# Patient Record
Sex: Male | Born: 1950 | Race: Black or African American | Hispanic: No | Marital: Single | Smoking: Never smoker
Health system: Southern US, Community
[De-identification: ages and names within clinical notes are randomized; demographics above are authoritative.]

## PROBLEM LIST (undated history)

## (undated) DIAGNOSIS — M109 Gout, unspecified: Secondary | ICD-10-CM

## (undated) DIAGNOSIS — Z8639 Personal history of other endocrine, nutritional and metabolic disease: Secondary | ICD-10-CM

## (undated) DIAGNOSIS — J386 Stenosis of larynx: Secondary | ICD-10-CM

## (undated) DIAGNOSIS — R112 Nausea with vomiting, unspecified: Secondary | ICD-10-CM

## (undated) DIAGNOSIS — R066 Hiccough: Secondary | ICD-10-CM

## (undated) DIAGNOSIS — I10 Essential (primary) hypertension: Secondary | ICD-10-CM

## (undated) DIAGNOSIS — R569 Unspecified convulsions: Secondary | ICD-10-CM

## (undated) DIAGNOSIS — R339 Retention of urine, unspecified: Secondary | ICD-10-CM

## (undated) DIAGNOSIS — F1010 Alcohol abuse, uncomplicated: Secondary | ICD-10-CM

## (undated) DIAGNOSIS — K219 Gastro-esophageal reflux disease without esophagitis: Secondary | ICD-10-CM

## (undated) HISTORY — PX: ESOPHAGOGASTRODUODENOSCOPY ENDOSCOPY: SHX5814

## (undated) HISTORY — PX: OTHER SURGICAL HISTORY: SHX169

## (undated) NOTE — *Deleted (*Deleted)
Neurology Progress Note  Patient ID: Evan Jacobs is a 7 y.o. with PMHx of  has a past medical history of Acid reflux, Alcohol abuse, Gout, Hypertension, and Seizures (HCC). Found to have severe hyponatremia to 113 and hypokalemia (2.4), magnesium 1.2  Initially consulted for: Altered mental status  Major interval events:  - Intubated for failure to protect airway / emesis ***  Subjective: ***  Exam: Vitals:   06/30/20 2000 06/30/20 2100  BP: 135/88 (!) 141/71  Pulse: 91 92  Resp: 20 18  Temp: 98.5 F (36.9 C)   SpO2: 100% 100%   Gen: In bed, comfortable  Resp: non-labored breathing, no grossly audible wheezing Cardiac: Perfusing extremities well  Abd: soft, nt  Neuro: MS: *** CN:*** Motor: *** Sensory:*** DTR:***  Dr. Amada Jupiter exam 10/31 for reference:  Neuro: Mental Status: Patient is awake, alert, when I call his name, he looks towards me and says "yes" but does not follow commands or tell me his name.  Cranial Nerves: II: He blinks to threat bilaterally pupils are equal, round, and reactive to light.   III,IV, VI: EOMI without ptosis or diploplia.  V: Facial sensation is symmetric to temperature VII: Facial movement with left facial weakness Motor: He does not cooperate with formal testing, but appears to move the right side slightly more than the left.  He does not keep either leg off the bed, but does counter gravity with both the left and right arm times Sensory: He response to noxious stimulation in all four extremities Cerebellar: Does not perform   Pertinent Labs: ***  MRI brain pending Routine EEG: EEG Abnormalities: 1) Generalized periodic discharges with triphasic morphology 2) Generalized high voltage slow activity.  Long-term EEG: *** No definite response to Ativan challenge  Impression: ***  Recommendations: - ***  Brooke Dare MD-PhD [facility] [phone]

---

## 2017-10-31 ENCOUNTER — Emergency Department (HOSPITAL_BASED_OUTPATIENT_CLINIC_OR_DEPARTMENT_OTHER): Payer: Medicare Other

## 2017-10-31 ENCOUNTER — Encounter (HOSPITAL_BASED_OUTPATIENT_CLINIC_OR_DEPARTMENT_OTHER): Payer: Self-pay | Admitting: Emergency Medicine

## 2017-10-31 ENCOUNTER — Other Ambulatory Visit: Payer: Self-pay

## 2017-10-31 ENCOUNTER — Emergency Department (HOSPITAL_BASED_OUTPATIENT_CLINIC_OR_DEPARTMENT_OTHER)
Admission: EM | Admit: 2017-10-31 | Discharge: 2017-11-01 | Disposition: A | Discharge status: Home / Self Care | Payer: Medicare Other | Attending: Emergency Medicine | Admitting: Emergency Medicine

## 2017-10-31 DIAGNOSIS — R112 Nausea with vomiting, unspecified: Secondary | ICD-10-CM

## 2017-10-31 DIAGNOSIS — R066 Hiccough: Secondary | ICD-10-CM | POA: Diagnosis not present

## 2017-10-31 DIAGNOSIS — I10 Essential (primary) hypertension: Secondary | ICD-10-CM | POA: Diagnosis not present

## 2017-10-31 DIAGNOSIS — E876 Hypokalemia: Secondary | ICD-10-CM

## 2017-10-31 HISTORY — DX: Essential (primary) hypertension: I10

## 2017-10-31 HISTORY — DX: Gastro-esophageal reflux disease without esophagitis: K21.9

## 2017-10-31 HISTORY — DX: Gout, unspecified: M10.9

## 2017-10-31 LAB — CBC WITH DIFFERENTIAL/PLATELET
Basophils Absolute: 0 10*3/uL (ref 0.0–0.1)
Basophils Relative: 0 %
Eosinophils Absolute: 0 10*3/uL (ref 0.0–0.7)
Eosinophils Relative: 0 %
HCT: 34.7 % — ABNORMAL LOW (ref 39.0–52.0)
Hemoglobin: 12 g/dL — ABNORMAL LOW (ref 13.0–17.0)
Lymphocytes Relative: 12 %
Lymphs Abs: 1.1 10*3/uL (ref 0.7–4.0)
MCH: 27.8 pg (ref 26.0–34.0)
MCHC: 34.6 g/dL (ref 30.0–36.0)
MCV: 80.3 fL (ref 78.0–100.0)
Monocytes Absolute: 1.1 10*3/uL — ABNORMAL HIGH (ref 0.1–1.0)
Monocytes Relative: 13 %
Neutro Abs: 6.5 10*3/uL (ref 1.7–7.7)
Neutrophils Relative %: 75 %
Platelets: 213 10*3/uL (ref 150–400)
RBC: 4.32 MIL/uL (ref 4.22–5.81)
RDW: 16.4 % — ABNORMAL HIGH (ref 11.5–15.5)
WBC: 8.7 10*3/uL (ref 4.0–10.5)

## 2017-10-31 LAB — COMPREHENSIVE METABOLIC PANEL
ALT: 18 U/L (ref 17–63)
AST: 31 U/L (ref 15–41)
Albumin: 4.5 g/dL (ref 3.5–5.0)
Alkaline Phosphatase: 66 U/L (ref 38–126)
Anion gap: 12 (ref 5–15)
BUN: 16 mg/dL (ref 6–20)
CO2: 28 mmol/L (ref 22–32)
Calcium: 8.9 mg/dL (ref 8.9–10.3)
Chloride: 93 mmol/L — ABNORMAL LOW (ref 101–111)
Creatinine, Ser: 1.01 mg/dL (ref 0.61–1.24)
GFR calc Af Amer: >60 mL/min (ref >60)
GFR calc non Af Amer: >60 mL/min (ref >60)
Glucose, Bld: 81 mg/dL (ref 65–99)
Potassium: 3.1 mmol/L — ABNORMAL LOW (ref 3.5–5.1)
Sodium: 133 mmol/L — ABNORMAL LOW (ref 135–145)
Total Bilirubin: 1 mg/dL (ref 0.3–1.2)
Total Protein: 7.9 g/dL (ref 6.5–8.1)

## 2017-10-31 LAB — LIPASE, BLOOD: Lipase: 21 U/L (ref 11–51)

## 2017-10-31 LAB — MAGNESIUM: Magnesium: 1.7 mg/dL (ref 1.7–2.4)

## 2017-10-31 LAB — TROPONIN I: Troponin I: 0.03 ng/mL (ref <0.03)

## 2017-10-31 MED ORDER — PANTOPRAZOLE SODIUM 40 MG IV SOLR
40.0000 mg | Freq: Once | INTRAVENOUS | Status: AC
  Administered 2017-10-31: 40 mg via INTRAVENOUS
  Filled 2017-10-31: qty 40

## 2017-10-31 MED ORDER — SODIUM CHLORIDE 0.9 % IV BOLUS (SEPSIS)
500.0000 mL | Freq: Once | INTRAVENOUS | Status: AC
  Administered 2017-10-31: 500 mL via INTRAVENOUS

## 2017-10-31 MED ORDER — POTASSIUM CHLORIDE CRYS ER 20 MEQ PO TBCR
40.0000 meq | EXTENDED_RELEASE_TABLET | Freq: Once | ORAL | Status: AC
  Administered 2017-10-31: 40 meq via ORAL
  Filled 2017-10-31: qty 2

## 2017-10-31 MED ORDER — IOPAMIDOL (ISOVUE-300) INJECTION 61%
100.0000 mL | Freq: Once | INTRAVENOUS | Status: AC | PRN
  Administered 2017-10-31: 100 mL via INTRAVENOUS

## 2017-10-31 MED ORDER — ONDANSETRON HCL 4 MG/2ML IJ SOLN
4.0000 mg | Freq: Once | INTRAMUSCULAR | Status: AC
  Administered 2017-10-31: 4 mg via INTRAVENOUS
  Filled 2017-10-31: qty 2

## 2017-10-31 NOTE — ED Notes (Addendum)
Alert, NAD, calm, interactive, resps e/u, speaking in clear complete sentences, no dyspnea noted, skin W&D, VSS, c/o hiccups, productive cough (brown), vomiting (x4), also some discomfort with cough and hiccups, also sore throat, (denies: pain, sob, fever, nausea, diarrhea, bleeding, dizziness or visual changes).

## 2017-10-31 NOTE — ED Triage Notes (Signed)
Patient states that he has had the hiccups x 2 days - Patient states that he is having N/V

## 2017-10-31 NOTE — ED Notes (Signed)
Dr Madilyn Hookees aware that pt has an elevated troponin level of 0.03.

## 2017-10-31 NOTE — ED Notes (Signed)
No changes. Alert, NAD, calm, interactive, resps e/u, speaking in clear complete sentences, no dyspnea noted, skin W&D, VSS, states, "feel better", (denies: pain, sob, nausea, dizziness or visual changes). [facility]

## 2017-11-01 DIAGNOSIS — R066 Hiccough: Secondary | ICD-10-CM | POA: Diagnosis not present

## 2017-11-01 LAB — TROPONIN I: Troponin I: 0.03 ng/mL (ref <0.03)

## 2017-11-01 MED ORDER — ONDANSETRON HCL 4 MG/2ML IJ SOLN
INTRAMUSCULAR | Status: AC
  Filled 2017-11-01: qty 2

## 2017-11-01 MED ORDER — ONDANSETRON HCL 4 MG/2ML IJ SOLN
4.0000 mg | Freq: Once | INTRAMUSCULAR | Status: AC
  Administered 2017-11-01: 4 mg via INTRAVENOUS

## 2017-11-01 MED ORDER — PROMETHAZINE HCL 25 MG PO TABS: 25.0000 mg | ORAL_TABLET | Freq: Four times a day (QID) | ORAL | 0 refills | Status: DC | PRN

## 2017-11-01 MED ORDER — OMEPRAZOLE 20 MG PO CPDR: 20.0000 mg | DELAYED_RELEASE_CAPSULE | Freq: Every day | ORAL | 0 refills | Status: DC

## 2017-11-01 NOTE — ED Provider Notes (Signed)
[facility] EMERGENCY DEPARTMENT Provider Note   CSN: [id number] Arrival date & time: 10/31/17  1805     History   Chief Complaint Chief Complaint  Patient presents with  . Hiccups    HPI Evan Jacobs is a 67 y.o. male.  The history is provided by the patient. No language interpreter was used.    Evan Jacobs is a 67 y.o. male who presents to the Emergency Department complaining of hiccups.  He has a history of recurrent hiccups.  2 days ago he drank beer and developed persistent hiccups with associated emesis, numerous episodes.  He denies any chest pain, abdominal pain.  Last bowel movement was yesterday and it was normal.  No hematochezia or melena.  He drinks occasionally but describes himself as not a heavy drinker.  Symptoms are moderate and constant in nature.  Past Medical History:  Diagnosis Date  . Acid reflux   . Gout   . Hypertension     There are no active problems to display for this patient.   History reviewed. No pertinent surgical history.     Home Medications    Prior to Admission medications   Medication Sig Start Date End Date Taking? Authorizing Provider  omeprazole (PRILOSEC) 20 MG capsule Take 1 capsule (20 mg total) by mouth daily. 11/01/17   Tilden Fossa, MD  promethazine (PHENERGAN) 25 MG tablet Take 1 tablet (25 mg total) by mouth every 6 (six) hours as needed for nausea or vomiting. 11/01/17   Tilden Fossa, MD    Family History History reviewed. No pertinent family history.  Social History Social History   Tobacco Use  . Smoking status: Never Smoker  . Smokeless tobacco: Never Used  Substance Use Topics  . Alcohol use: Yes    Comment: occ  . Drug use: No     Allergies   Patient has no known allergies.   Review of Systems Review of Systems  All other systems reviewed and are negative.    Physical Exam Updated Vital Signs BP (!) 162/78   Pulse 99   Temp 99.6 F (37.6 C) (Oral)   Resp (!) 24   Ht 5'  6" (1.676 m)   Wt 68 kg (150 lb)   SpO2 98%   BMI 24.21 kg/m   Physical Exam  Constitutional: He is oriented to person, place, and time. He appears well-developed and well-nourished.  HENT:  Head: Normocephalic and atraumatic.  Cardiovascular: Normal rate and regular rhythm.  Murmur heard. Pulmonary/Chest: Effort normal and breath sounds normal. No respiratory distress.  Abdominal: Soft. There is no tenderness. There is no rebound and no guarding.  Musculoskeletal: He exhibits no edema or tenderness.  Neurological: He is alert and oriented to person, place, and time.  Skin: Skin is warm and dry.  Psychiatric: He has a normal mood and affect. His behavior is normal.  Nursing note and vitals reviewed.    ED Treatments / Results  Labs (all labs ordered are listed, but only abnormal results are displayed) Labs Reviewed  COMPREHENSIVE METABOLIC PANEL - Abnormal; Notable for the following components:      Result Value   Sodium 133 (*)    Potassium 3.1 (*)    Chloride 93 (*)    All other components within normal limits  CBC WITH DIFFERENTIAL/PLATELET - Abnormal; Notable for the following components:   Hemoglobin 12.0 (*)    HCT 34.7 (*)    RDW 16.4 (*)    Monocytes Absolute 1.1 (*)  All other components within normal limits  TROPONIN I - Abnormal; Notable for the following components:   Troponin I 0.03 (*)    All other components within normal limits  TROPONIN I - Abnormal; Notable for the following components:   Troponin I 0.03 (*)    All other components within normal limits  LIPASE, BLOOD  MAGNESIUM    EKG  EKG Interpretation  Date/Time:  Sunday October 31 2017 21:48:58 EST Ventricular Rate:  100 PR Interval:    QRS Duration: 82 QT Interval:  335 QTC Calculation: 432 R Axis:   63 Text Interpretation:  Sinus tachycardia Left ventricular hypertrophy Anterior infarct, old Abnormal T, consider ischemia, lateral leads Confirmed by Tilden Fossaees, Phi Avans [phone]([id number]) on 10/31/2017  9:52:36 PM       Radiology Ct Abdomen Pelvis W Contrast  Result Date: 10/31/2017 CLINICAL DATA:  Nausea and vomiting EXAM: CT ABDOMEN AND PELVIS WITH CONTRAST TECHNIQUE: Multidetector CT imaging of the abdomen and pelvis was performed using the standard protocol following bolus administration of intravenous contrast. CONTRAST:  100mL ISOVUE-300 IOPAMIDOL (ISOVUE-300) INJECTION 61% COMPARISON:  05/27/2016 FINDINGS: Lower chest: Small hiatal hernia with thickening of the lower esophagus. Hepatobiliary: Normal hepatic contours and density. No visible biliary dilatation. Normal gallbladder. Pancreas: Normal parenchymal contours without ductal dilatation. No peripancreatic fluid collection. Spleen: Normal. Adrenals/Urinary Tract: --Adrenal glands: Normal. --Right kidney/ureter: No hydronephrosis, perinephric stranding or nephrolithiasis. No obstructing ureteral stones. --Left kidney/ureter: No hydronephrosis, perinephric stranding or nephrolithiasis. No obstructing ureteral stones. 14 mm interpolar renal cyst. --Urinary bladder: Normal appearance for the degree of distention. Stomach/Bowel: --Stomach/Duodenum: Small hiatal hernia with thickening of the lower esophagus. --Small bowel: No dilatation or inflammation. --Colon: No focal abnormality. --Appendix: Normal. Vascular/Lymphatic: Normal course and caliber of the major abdominal vessels. No abdominal or pelvic lymphadenopathy. Reproductive: Normal prostate and seminal vesicles. Musculoskeletal. No bony spinal canal stenosis or focal osseous abnormality. Other: None. IMPRESSION: 1. Unchanged small hiatal hernia with thickening of the distal esophagus, likely secondary to chronic gastroesophageal reflux. 2. No acute abnormality. Electronically Signed   By: Deatra RobinsonKevin  Herman M.D.   On: 10/31/2017 23:17    Procedures Procedures (including critical care time)  Medications Ordered in ED Medications  sodium chloride 0.9 % bolus 500 mL (0 mLs Intravenous Stopped  10/31/17 2249)  ondansetron (ZOFRAN) injection 4 mg (4 mg Intravenous Given 10/31/17 2206)  iopamidol (ISOVUE-300) 61 % injection 100 mL (100 mLs Intravenous Contrast Given 10/31/17 2235)  pantoprazole (PROTONIX) injection 40 mg (40 mg Intravenous Given 10/31/17 2338)  potassium chloride SA (K-DUR,KLOR-CON) CR tablet 40 mEq (40 mEq Oral Given 10/31/17 2357)  ondansetron (ZOFRAN) injection 4 mg (4 mg Intravenous Given 11/01/17 0028)     Initial Impression / Assessment and Plan / ED Course  I have reviewed the triage vital signs and the nursing notes.  Pertinent labs & imaging results that were available during my care of the patient were reviewed by me and considered in my medical decision making (see chart for details).     Patient here for evaluation of nausea, vomiting, hiccups.  He has no significant tenderness on examination.  EKG is abnormal with no priors available for comparison.  He has no reports of chest pain.  He does have a history of hypertension and EKG findings could be found with LVH.  Initial troponin is minimally elevated.  Discussed with on-call cardiologist, Dr. Mayford Knifeurner,  who reviewed patient's EKG-plan for repeat troponin and if stable discharge home with outpatient follow-up. In terms of vomiting and  hiccups, initial concern for possible obstruction.  CT abdomen without acute obstruction.  Patient is tolerating orals in the department without difficulty.  Plan to discharge home with antiemetic.  Discussed importance of PCP follow-up regarding EKG findings as well as his current symptoms.  Discussed close return precautions.  Final Clinical Impressions(s) / ED Diagnoses   Final diagnoses:  Non-intractable vomiting with nausea, unspecified vomiting type  Hiccups  Hypokalemia    ED Discharge Orders        Ordered    promethazine (PHENERGAN) 25 MG tablet  Every 6 hours PRN     11/01/17 0049    omeprazole (PRILOSEC) 20 MG capsule  Daily     11/01/17 0049       Tilden Fossa,  MD 11/01/17 [phone]

## 2017-11-01 NOTE — ED Notes (Signed)
Dr Madilyn Hookees aware of elevated troponin level of 0.03

## 2017-11-01 NOTE — ED Notes (Signed)
Returned from b/r. C/o nausea. Repetitive persistent swallowing/hiccup/belching/reguritation w/o return. No other changes. Alert, NAD, calm, interactive.

## 2017-11-01 NOTE — Discharge Instructions (Signed)
Get rechecked immediately if you develop chest pain or abdominal pain.   Please follow-up with your family doctor immediately for further testing.  Get rechecked immediately if you develop any chest pain.

## 2018-10-01 HISTORY — PX: COLONOSCOPY: SHX174

## 2019-08-29 ENCOUNTER — Inpatient Hospital Stay (HOSPITAL_COMMUNITY)
Admission: EM | Admit: 2019-08-29 | Discharge: 2019-08-31 | DRG: 101 | Disposition: A | Discharge status: Home Health | Payer: Medicare Other | Attending: Internal Medicine | Admitting: Internal Medicine

## 2019-08-29 ENCOUNTER — Inpatient Hospital Stay (HOSPITAL_COMMUNITY): Payer: Medicare Other

## 2019-08-29 ENCOUNTER — Encounter (HOSPITAL_COMMUNITY): Payer: Self-pay | Admitting: Internal Medicine

## 2019-08-29 ENCOUNTER — Inpatient Hospital Stay (HOSPITAL_COMMUNITY)
Admit: 2019-08-29 | Discharge: 2019-08-29 | Disposition: A | Discharge status: Home / Self Care | Payer: Medicare Other | Attending: Internal Medicine | Admitting: Internal Medicine

## 2019-08-29 ENCOUNTER — Emergency Department (HOSPITAL_COMMUNITY): Payer: Medicare Other

## 2019-08-29 ENCOUNTER — Other Ambulatory Visit: Payer: Self-pay

## 2019-08-29 DIAGNOSIS — G40909 Epilepsy, unspecified, not intractable, without status epilepticus: Secondary | ICD-10-CM | POA: Diagnosis present

## 2019-08-29 DIAGNOSIS — Z20828 Contact with and (suspected) exposure to other viral communicable diseases: Secondary | ICD-10-CM | POA: Diagnosis present

## 2019-08-29 DIAGNOSIS — R4182 Altered mental status, unspecified: Secondary | ICD-10-CM

## 2019-08-29 DIAGNOSIS — E876 Hypokalemia: Secondary | ICD-10-CM | POA: Diagnosis present

## 2019-08-29 DIAGNOSIS — I10 Essential (primary) hypertension: Secondary | ICD-10-CM | POA: Diagnosis present

## 2019-08-29 DIAGNOSIS — E861 Hypovolemia: Secondary | ICD-10-CM | POA: Diagnosis present

## 2019-08-29 DIAGNOSIS — F1010 Alcohol abuse, uncomplicated: Secondary | ICD-10-CM | POA: Diagnosis present

## 2019-08-29 DIAGNOSIS — M109 Gout, unspecified: Secondary | ICD-10-CM | POA: Diagnosis present

## 2019-08-29 DIAGNOSIS — E222 Syndrome of inappropriate secretion of antidiuretic hormone: Secondary | ICD-10-CM | POA: Diagnosis present

## 2019-08-29 DIAGNOSIS — E871 Hypo-osmolality and hyponatremia: Secondary | ICD-10-CM

## 2019-08-29 DIAGNOSIS — R569 Unspecified convulsions: Secondary | ICD-10-CM | POA: Diagnosis not present

## 2019-08-29 DIAGNOSIS — K219 Gastro-esophageal reflux disease without esophagitis: Secondary | ICD-10-CM | POA: Diagnosis present

## 2019-08-29 DIAGNOSIS — Z79899 Other long term (current) drug therapy: Secondary | ICD-10-CM | POA: Diagnosis not present

## 2019-08-29 DIAGNOSIS — F10231 Alcohol dependence with withdrawal delirium: Secondary | ICD-10-CM | POA: Diagnosis not present

## 2019-08-29 DIAGNOSIS — R9431 Abnormal electrocardiogram [ECG] [EKG]: Secondary | ICD-10-CM | POA: Diagnosis present

## 2019-08-29 DIAGNOSIS — E872 Acidosis, unspecified: Secondary | ICD-10-CM

## 2019-08-29 DIAGNOSIS — R066 Hiccough: Secondary | ICD-10-CM | POA: Diagnosis present

## 2019-08-29 HISTORY — DX: Unspecified convulsions: R56.9

## 2019-08-29 HISTORY — DX: Alcohol abuse, uncomplicated: F10.10

## 2019-08-29 LAB — BASIC METABOLIC PANEL
Anion gap: 14 (ref 5–15)
Anion gap: 8 (ref 5–15)
BUN: <5 mg/dL — ABNORMAL LOW (ref 8–23)
BUN: <5 mg/dL — ABNORMAL LOW (ref 8–23)
CO2: 25 mmol/L (ref 22–32)
CO2: 25 mmol/L (ref 22–32)
Calcium: 8.3 mg/dL — ABNORMAL LOW (ref 8.9–10.3)
Calcium: 8.3 mg/dL — ABNORMAL LOW (ref 8.9–10.3)
Chloride: 85 mmol/L — ABNORMAL LOW (ref 98–111)
Chloride: 98 mmol/L (ref 98–111)
Creatinine, Ser: 0.82 mg/dL (ref 0.61–1.24)
Creatinine, Ser: 0.9 mg/dL (ref 0.61–1.24)
GFR calc Af Amer: >60 mL/min (ref >60)
GFR calc Af Amer: >60 mL/min (ref >60)
GFR calc non Af Amer: >60 mL/min (ref >60)
GFR calc non Af Amer: >60 mL/min (ref >60)
Glucose, Bld: 100 mg/dL — ABNORMAL HIGH (ref 70–99)
Glucose, Bld: 96 mg/dL (ref 70–99)
Potassium: 2.8 mmol/L — ABNORMAL LOW (ref 3.5–5.1)
Potassium: 3.6 mmol/L (ref 3.5–5.1)
Sodium: 124 mmol/L — ABNORMAL LOW (ref 135–145)
Sodium: 131 mmol/L — ABNORMAL LOW (ref 135–145)

## 2019-08-29 LAB — COMPREHENSIVE METABOLIC PANEL
ALT: 15 U/L (ref 0–44)
ALT: 12 U/L (ref 0–44)
AST: 50 U/L — ABNORMAL HIGH (ref 15–41)
AST: 40 U/L (ref 15–41)
Albumin: 4.6 g/dL (ref 3.5–5.0)
Albumin: 4 g/dL (ref 3.5–5.0)
Alkaline Phosphatase: 45 U/L (ref 38–126)
Alkaline Phosphatase: 41 U/L (ref 38–126)
Anion gap: 14 (ref 5–15)
Anion gap: 9 (ref 5–15)
BUN: 5 mg/dL — ABNORMAL LOW (ref 8–23)
BUN: <5 mg/dL — ABNORMAL LOW (ref 8–23)
CO2: 26 mmol/L (ref 22–32)
CO2: 26 mmol/L (ref 22–32)
Calcium: 8.5 mg/dL — ABNORMAL LOW (ref 8.9–10.3)
Calcium: 8.1 mg/dL — ABNORMAL LOW (ref 8.9–10.3)
Chloride: 84 mmol/L — ABNORMAL LOW (ref 98–111)
Chloride: 92 mmol/L — ABNORMAL LOW (ref 98–111)
Creatinine, Ser: 0.85 mg/dL (ref 0.61–1.24)
Creatinine, Ser: 0.75 mg/dL (ref 0.61–1.24)
GFR calc Af Amer: >60 mL/min (ref >60)
GFR calc Af Amer: >60 mL/min (ref >60)
GFR calc non Af Amer: >60 mL/min (ref >60)
GFR calc non Af Amer: >60 mL/min (ref >60)
Glucose, Bld: 97 mg/dL (ref 70–99)
Glucose, Bld: 92 mg/dL (ref 70–99)
Potassium: 3.2 mmol/L — ABNORMAL LOW (ref 3.5–5.1)
Potassium: 3.2 mmol/L — ABNORMAL LOW (ref 3.5–5.1)
Sodium: 124 mmol/L — ABNORMAL LOW (ref 135–145)
Sodium: 127 mmol/L — ABNORMAL LOW (ref 135–145)
Total Bilirubin: 1.3 mg/dL — ABNORMAL HIGH (ref 0.3–1.2)
Total Bilirubin: 1.4 mg/dL — ABNORMAL HIGH (ref 0.3–1.2)
Total Protein: 7.5 g/dL (ref 6.5–8.1)
Total Protein: 7.1 g/dL (ref 6.5–8.1)

## 2019-08-29 LAB — RAPID URINE DRUG SCREEN, HOSP PERFORMED
Amphetamines: NOT DETECTED
Barbiturates: NOT DETECTED
Benzodiazepines: POSITIVE — AB
Cocaine: NOT DETECTED
Opiates: NOT DETECTED
Tetrahydrocannabinol: NOT DETECTED

## 2019-08-29 LAB — PHOSPHORUS

## 2019-08-29 LAB — LACTIC ACID, PLASMA
Lactic Acid, Venous: 4 mmol/L (ref 0.5–1.9)
Lactic Acid, Venous: 1.5 mmol/L (ref 0.5–1.9)

## 2019-08-29 LAB — HEPATIC FUNCTION PANEL

## 2019-08-29 LAB — OSMOLALITY

## 2019-08-29 LAB — CBC WITH DIFFERENTIAL/PLATELET
Abs Immature Granulocytes: 0.02 10*3/uL (ref 0.00–0.07)
Basophils Absolute: 0 10*3/uL (ref 0.0–0.1)
Basophils Relative: 0 %
Eosinophils Absolute: 0.1 10*3/uL (ref 0.0–0.5)
Eosinophils Relative: 3 %
HCT: 41.6 % (ref 39.0–52.0)
Hemoglobin: 13.5 g/dL (ref 13.0–17.0)
Immature Granulocytes: 0 %
Lymphocytes Relative: 29 %
Lymphs Abs: 1.4 10*3/uL (ref 0.7–4.0)
MCH: 25.5 pg — ABNORMAL LOW (ref 26.0–34.0)
MCHC: 32.5 g/dL (ref 30.0–36.0)
MCV: 78.6 fL — ABNORMAL LOW (ref 80.0–100.0)
Monocytes Absolute: 0.6 10*3/uL (ref 0.1–1.0)
Monocytes Relative: 13 %
Neutro Abs: 2.7 10*3/uL (ref 1.7–7.7)
Neutrophils Relative %: 55 %
Platelets: 197 10*3/uL (ref 150–400)
RBC: 5.29 MIL/uL (ref 4.22–5.81)
RDW: 16.6 % — ABNORMAL HIGH (ref 11.5–15.5)
WBC: 4.8 10*3/uL (ref 4.0–10.5)
nRBC: 0 % (ref 0.0–0.2)

## 2019-08-29 LAB — URINALYSIS, ROUTINE W REFLEX MICROSCOPIC
Bacteria, UA: NONE SEEN
Bilirubin Urine: NEGATIVE
Glucose, UA: NEGATIVE mg/dL
Hgb urine dipstick: NEGATIVE
Ketones, ur: NEGATIVE mg/dL
Leukocytes,Ua: NEGATIVE
Nitrite: NEGATIVE
Protein, ur: 100 mg/dL — AB
Specific Gravity, Urine: 1.009 (ref 1.005–1.030)
pH: 6 (ref 5.0–8.0)

## 2019-08-29 LAB — HIV ANTIBODY (ROUTINE TESTING W REFLEX)

## 2019-08-29 LAB — AMMONIA: Ammonia: 19 umol/L (ref 9–35)

## 2019-08-29 LAB — OSMOLALITY, URINE: Osmolality, Ur: 139 mOsm/kg — ABNORMAL LOW (ref 300–900)

## 2019-08-29 LAB — HIV4GL SAVE TUBE

## 2019-08-29 LAB — MAGNESIUM: Magnesium: 1.2 mg/dL — ABNORMAL LOW (ref 1.7–2.4)

## 2019-08-29 LAB — SARS CORONAVIRUS 2 (TAT 6-24 HRS): SARS Coronavirus 2: NEGATIVE

## 2019-08-29 LAB — SODIUM, URINE, RANDOM: Sodium, Ur: 21 mmol/L

## 2019-08-29 LAB — ETHANOL: Alcohol, Ethyl (B): <10 mg/dL (ref <10)

## 2019-08-29 MED ORDER — MAGNESIUM SULFATE 50 % IJ SOLN: 2.0000 g | Freq: Once | INTRAMUSCULAR | Status: DC

## 2019-08-29 MED ORDER — SODIUM CHLORIDE 3 % IV SOLN
INTRAVENOUS | Status: DC
  Administered 2019-08-29: 09:00:00 45 mL/h via INTRAVENOUS
  Filled 2019-08-29 (×2): qty 500

## 2019-08-29 MED ORDER — THIAMINE HCL 100 MG PO TABS: 100.0000 mg | ORAL_TABLET | Freq: Every day | ORAL | Status: DC

## 2019-08-29 MED ORDER — LORAZEPAM 2 MG/ML IJ SOLN
1.0000 mg | Freq: Four times a day (QID) | INTRAMUSCULAR | Status: AC
  Administered 2019-08-29 – 2019-08-30 (×2): 1 mg via INTRAVENOUS
  Filled 2019-08-29 (×2): qty 1

## 2019-08-29 MED ORDER — SODIUM CHLORIDE 0.9 % IV SOLN: INTRAVENOUS | Status: DC

## 2019-08-29 MED ORDER — ACETAMINOPHEN 325 MG PO TABS: 650.0000 mg | ORAL_TABLET | Freq: Four times a day (QID) | ORAL | Status: DC | PRN

## 2019-08-29 MED ORDER — POTASSIUM CHLORIDE 10 MEQ/100ML IV SOLN
10.0000 meq | INTRAVENOUS | Status: DC
  Filled 2019-08-29: qty 100

## 2019-08-29 MED ORDER — LEVETIRACETAM IN NACL 1000 MG/100ML IV SOLN
1000.0000 mg | Freq: Two times a day (BID) | INTRAVENOUS | Status: DC
  Administered 2019-08-29 – 2019-08-30 (×3): 1000 mg via INTRAVENOUS
  Filled 2019-08-29 (×3): qty 100

## 2019-08-29 MED ORDER — LORAZEPAM 2 MG/ML IJ SOLN
2.0000 mg | Freq: Four times a day (QID) | INTRAMUSCULAR | Status: DC
  Administered 2019-08-29: 07:00:00 2 mg via INTRAVENOUS
  Filled 2019-08-29: qty 1

## 2019-08-29 MED ORDER — LEVETIRACETAM IN NACL 1000 MG/100ML IV SOLN
1000.0000 mg | Freq: Once | INTRAVENOUS | Status: AC
  Administered 2019-08-29: 05:00:00 1000 mg via INTRAVENOUS
  Filled 2019-08-29: qty 100

## 2019-08-29 MED ORDER — MAGNESIUM SULFATE 2 GM/50ML IV SOLN
2.0000 g | Freq: Once | INTRAVENOUS | Status: AC
  Administered 2019-08-29: 05:00:00 2 g via INTRAVENOUS
  Filled 2019-08-29: qty 50

## 2019-08-29 MED ORDER — THIAMINE HCL 100 MG/ML IJ SOLN
500.0000 mg | Freq: Three times a day (TID) | INTRAVENOUS | Status: DC
  Administered 2019-08-29 – 2019-08-31 (×6): 500 mg via INTRAVENOUS
  Filled 2019-08-29: qty 5
  Filled 2019-08-29 (×2): qty 2
  Filled 2019-08-29 (×6): qty 5

## 2019-08-29 MED ORDER — MAGNESIUM SULFATE 2 GM/50ML IV SOLN
2.0000 g | Freq: Once | INTRAVENOUS | Status: AC
  Administered 2019-08-29: 07:00:00 2 g via INTRAVENOUS
  Filled 2019-08-29: qty 50

## 2019-08-29 MED ORDER — LORAZEPAM 2 MG/ML IJ SOLN
1.0000 mg | INTRAMUSCULAR | Status: DC | PRN
  Administered 2019-08-29: 18:00:00 2 mg via INTRAVENOUS
  Filled 2019-08-29: qty 1

## 2019-08-29 MED ORDER — HYDRALAZINE HCL 20 MG/ML IJ SOLN
5.0000 mg | INTRAMUSCULAR | Status: DC | PRN
  Administered 2019-08-30: 5 mg via INTRAVENOUS
  Filled 2019-08-29: qty 1

## 2019-08-29 MED ORDER — ACETAMINOPHEN 650 MG RE SUPP
650.0000 mg | Freq: Four times a day (QID) | RECTAL | Status: DC | PRN
  Administered 2019-08-29: 14:00:00 650 mg via RECTAL
  Filled 2019-08-29: qty 1

## 2019-08-29 MED ORDER — POTASSIUM CHLORIDE 10 MEQ/100ML IV SOLN
10.0000 meq | INTRAVENOUS | Status: DC
  Administered 2019-08-29 (×4): 10 meq via INTRAVENOUS
  Filled 2019-08-29 (×3): qty 100

## 2019-08-29 MED ORDER — ENOXAPARIN SODIUM 40 MG/0.4ML ~~LOC~~ SOLN
40.0000 mg | SUBCUTANEOUS | Status: DC
  Administered 2019-08-29 – 2019-08-31 (×3): 40 mg via SUBCUTANEOUS
  Filled 2019-08-29 (×3): qty 0.4

## 2019-08-29 MED ORDER — FOLIC ACID 1 MG PO TABS
1.0000 mg | ORAL_TABLET | Freq: Every day | ORAL | Status: DC
  Administered 2019-08-30 – 2019-08-31 (×2): 1 mg via ORAL
  Filled 2019-08-29 (×3): qty 1

## 2019-08-29 MED ORDER — POTASSIUM CHLORIDE 10 MEQ/100ML IV SOLN
10.0000 meq | INTRAVENOUS | Status: DC
  Administered 2019-08-29: 10 meq via INTRAVENOUS
  Filled 2019-08-29: qty 100

## 2019-08-29 MED ORDER — SODIUM CHLORIDE 0.9 % IV BOLUS
500.0000 mL | Freq: Once | INTRAVENOUS | Status: AC
  Administered 2019-08-29: 04:00:00 500 mL via INTRAVENOUS

## 2019-08-29 MED ORDER — LORAZEPAM 1 MG PO TABS: 1.0000 mg | ORAL_TABLET | ORAL | Status: DC | PRN

## 2019-08-29 MED ORDER — ADULT MULTIVITAMIN W/MINERALS CH
1.0000 | ORAL_TABLET | Freq: Every day | ORAL | Status: DC
  Administered 2019-08-30 – 2019-08-31 (×2): 1 via ORAL
  Filled 2019-08-29 (×2): qty 1

## 2019-08-29 MED ORDER — THIAMINE HCL 100 MG/ML IJ SOLN: 100.0000 mg | Freq: Every day | INTRAMUSCULAR | Status: DC

## 2019-08-29 NOTE — ED Triage Notes (Signed)
68 yo BIB [facility] from home. Pts family member called EMS stating pt had a seizure. Pt has history or seizures. EMS arrived at scene and pt was post ictal. EMS states pt was alert to self only. EMS states pt seized en route and was given 2.5 mg of midazolam.   Vitals: bp 126/74  Hr 60 Sat 98 room air rr 18 cbg 121  20 g right hand
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
## 2019-08-29 NOTE — ED Notes (Signed)
Pt provided with peri care and fresh linens. Pt repositioned in the bed.

## 2019-08-29 NOTE — ED Provider Notes (Signed)
[facility]-EMERGENCY DEPT Provider Note   CSN: [id number] Arrival date & time: 08/29/19  0032     History Chief Complaint  Patient presents with  . Seizures    Evan Jacobs is a 68 y.o. male.  Patient to ED via EMS who reports family called for seizure activity x 1 at home tonight. History of seizures, on Keppra (500 mg BID, per chart). EMS reports he was talking but appeared post-ictal. No vomiting, urinary incontinence. No reported fever or recent illness. Per EMS, he had 1 seizure in route and was given Midazolam 2.5 mg. Patient unable to contribute to history. Attempt to contact family for additional information unsuccessful.   The history is provided by the EMS personnel. No language interpreter was used.  Seizures      Past Medical History:  Diagnosis Date  . Acid reflux   . Gout   . Hypertension     There are no problems to display for this patient.   No past surgical history on file.     No family history on file.  Social History   Tobacco Use  . Smoking status: Never Smoker  . Smokeless tobacco: Never Used  Substance Use Topics  . Alcohol use: Yes    Comment: occ  . Drug use: No    Home Medications Prior to Admission medications   Medication Sig Start Date End Date Taking? Authorizing Provider  omeprazole (PRILOSEC) 20 MG capsule Take 1 capsule (20 mg total) by mouth daily. 11/01/17   Tilden Fossa, MD  promethazine (PHENERGAN) 25 MG tablet Take 1 tablet (25 mg total) by mouth every 6 (six) hours as needed for nausea or vomiting. 11/01/17   Tilden Fossa, MD    Allergies    Patient has no known allergies.  Review of Systems   Review of Systems  Unable to perform ROS: Mental status change  Neurological: Positive for seizures.    Physical Exam Updated Vital Signs BP 139/72 (BP Location: Left Arm)   Pulse (!) 59   Resp (!) 26   SpO2 98%   Physical Exam Vitals and nursing note reviewed.  Constitutional:    Appearance: He is not diaphoretic.     Comments: Nonverbal, rightward gaze without tracking any movement.   HENT:     Head: Atraumatic.  Cardiovascular:     Rate and Rhythm: Normal rate and regular rhythm.     Heart sounds: No murmur.  Pulmonary:     Effort: No respiratory distress.     Breath sounds: No wheezing, rhonchi or rales.  Abdominal:     General: There is no distension.     Palpations: Abdomen is soft.  Musculoskeletal:     Cervical back: Neck supple.  Skin:    General: Skin is warm and dry.  Neurological:     Comments: Opens eyes to name being called. Does not follow command. Eyes fixed in rightward gaze. No seizure activity, no extremity rigidity.      ED Results / Procedures / Treatments   Labs (all labs ordered are listed, but only abnormal results are displayed) Labs Reviewed - No data to display  EKG None  Radiology No results found.  Procedures Procedures (including critical care time)  Medications Ordered in ED Medications - No data to display  ED Course  I have reviewed the triage vital signs and the nursing notes.  Pertinent labs & imaging results that were available during my care of the patient were reviewed by me  and considered in my medical decision making (see chart for details).    MDM Rules/Calculators/A&P                      Patient to ED from home for seizures, as detailed in HPI.  Patient is unable to contribute to history. He is nonverbal in ED. Collateral information from contact list phone numbers is unavailable.   1:15 - no change in mentation. Head CT negative for acute findings.   Labs significant for hyponatremia, 124, chloride 84. Normal renal function, no leukocytosis. Review of chart everywhere shows previous admissions for hyponatremia. Lactate 4.0, felt secondary to witnessed seizure activity. Magnesium level added and is low at 1.2.   3:30 - second attempt to contact family for collateral information unsuccessful. #'s  tried - [phone], [phone] (x 2).  COVID collected, pending. Will call for admission to hospitalist service. Patient's mental status remains unchanged. No seizure activity in ED. Keppra load provided.   Hospitalist consulted for admission, Dr. Marlowe Sax, who accepts the patient onto her service.   Final Clinical Impression(s) / ED Diagnoses Final diagnoses:  None   1. Altered mental status 2. Hyponatremia 3. Seizures, h/o sz do  Rx / DC Orders ED Discharge Orders    None       Charlann Lange, PA-C 08/29/19 [place], [place], MD 09/02/19 2222

## 2019-08-29 NOTE — Progress Notes (Signed)
EEG complete - results pending 

## 2019-08-29 NOTE — ED Notes (Signed)
Spoke with Dr. Loleta Books regarding critical labs-- Calcium, Potassium and Sodium. MD informed this RN that he wanted to stopped the potassium infusions at this time pending additional repeat blood work needing to be obtained.

## 2019-08-29 NOTE — ED Notes (Signed)
X-ray at bedside

## 2019-08-29 NOTE — Progress Notes (Signed)
PROGRESS NOTE    Okechukwu Regnier  GBT:[id number] DOB: 1951/08/12 DOA: 08/29/2019 PCP: Patient, No Pcp Per      Brief Narrative:  Mr. Egerton is a 68 y.o. M with seizures on Keppra, hyponatremia on salt tabs at home, recently admitted to OSH with Na 120, HTN, and alcohol use who presented with seizure.  Per report, family called EMS due to a seizure.  EMS found the patient postictal, then he had another seizure en route, aborted with Versed.  In the ER, lactate 4, sodium 124, potassium 3.2, blood alcohol undetectable, UDS positive only for benzos, urinalysis suggestive of infection, chest x-ray clear, CT head unremarkable.  He was given IV Keppra and the hospital service were asked to evaluate for admission.         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  Supposed to take salt tabs at home. Patient readmitted at [facility] 1 month ago (had vague complaints, found incidentally to have sodium 120, asymptomatic, treated with 0.9% saline and restarted on salt tabs TID and sodium 132 at discharge). See above, given seizure, feel necessary to treat with saline, goal correction 6 pts in 12 hours -Hypertonic saline  Hypokalemia Hypomagnesemia -Supplement K and mag  Alcohol use disorder -CIWA scoring  Hypertension -PRN hydralazine for severe range pressures -Restart amlodipine, valsartan, diltiazem and metoprolol when able to take p.o.  Chronic hiccups -Hold thorazine until able to take PO     DVT prophylaxis: Lovenox Code Status: Full code Family Communication:  MDM and disposition Plan: This is a no charge note.  For further details, please see H&P by my partner Dr.  Loney Loh from earlier today.  The below labs and imaging reports were reviewed and summarized above.    The patient was admitted with seizures and hyponatremia.    Objective: Vitals:   08/29/19 1130 08/29/19 1210 08/29/19 1230 08/29/19 1300  BP: (!) 145/75 137/76 (!) 164/78 128/89  Pulse: 81 81 92 88  Resp: (!) 23 (!) 24 (!) 32 (!) 27  Temp:   (!) 101.3 F (38.5 C)   TempSrc:      SpO2: 98% 96% 97% 100%    Intake/Output Summary (Last 24 hours) at 08/29/2019 1330 Last data filed at 08/29/2019 7106 Gross per 24 hour  Intake 650 ml  Output --  Net 650 ml   There were no vitals filed for this visit.  Examination: The patient was seen and examined.      Data Reviewed: I have personally reviewed following labs and imaging studies:  CBC: Recent Labs  Lab 08/29/19 0214  WBC 4.8  NEUTROABS 2.7  HGB 13.5  HCT 41.6  MCV 78.6*  PLT 197   Basic Metabolic Panel: Recent Labs  Lab 08/29/19 0214 08/29/19 0239 08/29/19 0515 08/29/19 1005 08/29/19 1151  NA 124*  --  124* 106* 127*  K 3.2*  --  2.8* >7.5* 3.2*  CL 84*  --  85* 110 92*  CO2 26  --  25 13* 26  GLUCOSE 97  --  100* 49* 92  BUN 5*  --  <5* <5* <5*  CREATININE 0.85  --  0.82 <0.30* 0.75  CALCIUM 8.5*  --  8.3* <4.0* 8.1*  MG  --  1.2*  --  1.0*  --   PHOS  --   --   --  1.4*  --    GFR: CrCl cannot be calculated (Unknown ideal weight.). Liver Function Tests: Recent Labs  Lab 08/29/19 0214 08/29/19 1005 08/29/19 1151  AST 50* 20 40  ALT 15 7 12   ALKPHOS 45 16* 41  BILITOT 1.3* 0.5 1.4*  PROT 7.5 <3.0* 7.1  ALBUMIN 4.6 1.6* 4.0   No results for input(s): LIPASE, AMYLASE in the last 168 hours. No results for input(s): AMMONIA in the last 168 hours. Coagulation Profile: No results for input(s): INR, PROTIME in the last 168 hours. Cardiac Enzymes: No results for input(s): CKTOTAL, CKMB, CKMBINDEX, TROPONINI in the last 168 hours. BNP (last 3 results) No results for input(s): PROBNP in the last  8760 hours. HbA1C: No results for input(s): HGBA1C in the last 72 hours. CBG: No results for input(s): GLUCAP in the last 168 hours. Lipid Profile: No results for input(s): CHOL, HDL, LDLCALC, TRIG, CHOLHDL, LDLDIRECT in the last 72 hours. Thyroid Function Tests: No results for input(s): TSH, T4TOTAL, FREET4, T3FREE, THYROIDAB in the last 72 hours. Anemia Panel: No results for input(s): VITAMINB12, FOLATE, FERRITIN, TIBC, IRON, RETICCTPCT in the last 72 hours. Urine analysis:    Component Value Date/Time   COLORURINE YELLOW 08/29/2019 0214   APPEARANCEUR CLEAR 08/29/2019 0214   LABSPEC 1.009 08/29/2019 0214   PHURINE 6.0 08/29/2019 0214   GLUCOSEU NEGATIVE 08/29/2019 0214   HGBUR NEGATIVE 08/29/2019 0214   BILIRUBINUR NEGATIVE 08/29/2019 [place] 08/29/2019 0214   PROTEINUR 100 (A) 08/29/2019 0214   NITRITE NEGATIVE 08/29/2019 0214   LEUKOCYTESUR NEGATIVE 08/29/2019 0214   Sepsis Labs: @LABRCNTIP (procalcitonin:4,lacticacidven:4)  ) Recent Results (from the past 240 hour(s))  SARS CORONAVIRUS 2 (TAT 6-24 HRS) Nasopharyngeal Nasopharyngeal Swab     Status: None   Collection Time: 08/29/19  3:47 AM   Specimen: Nasopharyngeal Swab  Result Value Ref Range Status   SARS Coronavirus 2 NEGATIVE NEGATIVE Final    Comment: (NOTE) SARS-CoV-2 target nucleic acids are NOT DETECTED. The SARS-CoV-2 RNA is generally detectable in upper and lower respiratory specimens during the acute phase of infection. Negative results do not preclude SARS-CoV-2 infection, do not rule out co-infections with other pathogens, and should not be used as the sole basis for treatment or other patient management decisions. Negative results must be combined with clinical observations, patient history, and epidemiological information. The expected result is Negative. Fact Sheet for Patients: SugarRoll.be Fact Sheet for Healthcare  Providers: https://www.woods-mathews.com/ This test is not yet approved or cleared by the [place] FDA and  has been authorized for detection and/or diagnosis of SARS-CoV-2 by FDA under an Emergency Use Authorization (EUA). This EUA will remain  in effect (meaning this test can be used) for the duration of the COVID-19 declaration under Section 56 4(b)(1) of the Act, 21 U.S.C. section 360bbb-3(b)(1), unless the authorization is terminated or revoked sooner. Performed at [facility], [place] [address] , [place] [address]          Radiology Studies: DG Abdomen 1 View  Result Date: 08/29/2019 CLINICAL DATA:  Pre MRI. EXAM: ABDOMEN - 1 VIEW COMPARISON:  Radiograph 01/03/2019 FINDINGS: No radiopaque foreign body or metallic medical device projects over the abdomen to preclude MRI imaging. Scattered air throughout nondilated small bowel in the central abdomen. EKG lead projects over the lower chest. Pelvic calcifications likely phleboliths. Unremarkable osseous structures. IMPRESSION: No radiopaque foreign  body projects over the abdomen to preclude MRI imaging. Electronically Signed   By: Narda Rutherford M.D.   On: 08/29/2019 06:59   CT Head Wo Contrast  Result Date: 08/29/2019 CLINICAL DATA:  Altered mental status. Seizure. EXAM: CT HEAD WITHOUT CONTRAST TECHNIQUE: Contiguous axial images were obtained from the base of the skull through the vertex without intravenous contrast. COMPARISON:  CT 05/29/2019, multiple priors. Prior imaging obtained at [facility]. FINDINGS: Brain: No intracranial hemorrhage, mass effect, or midline shift. Stable degree of generalized atrophy. No hydrocephalus. The basilar cisterns are patent. Unchanged mild chronic small vessel ischemia from prior exam. Possible tiny remote lacunar infarct in the right caudate. No evidence of territorial infarct or acute ischemia. No extra-axial or intracranial fluid collection. Vascular: Atherosclerosis  of skullbase vasculature without hyperdense vessel or abnormal calcification. Skull: No fracture or focal lesion. Sinuses/Orbits: Chronic left frontal sinusitis with chronic erosion of the anterior wall and diffuse cortical thickening. Findings are unchanged over multiple prior exams. Scattered mucosal thickening of ethmoid air cells. The mastoid air cells are clear. Leftward gaze, orbits are otherwise unremarkable. Other: None. IMPRESSION: 1. No acute intracranial abnormality. 2. Stable atrophy and chronic small vessel ischemia. 3. Chronic left frontal sinusitis, unchanged over multiple prior exams. Electronically Signed   By: Narda Rutherford M.D.   On: 08/29/2019 02:55   MR BRAIN WO CONTRAST  Result Date: 08/29/2019 CLINICAL DATA:  Encephalopathy. EXAM: MRI HEAD WITHOUT CONTRAST TECHNIQUE: Multiplanar, multiecho pulse sequences of the brain and surrounding structures were obtained without intravenous contrast. COMPARISON:  Head CT performed earlier the same day 08/29/2019, brain MRI 05/16/2018 FINDINGS: Brain: There is no evidence of acute infarct. No evidence of intracranial mass. No midline shift or extra-axial fluid collection. No chronic intracranial blood products. Mild patchy and ill-defined T2/FLAIR hyperintensity within the cerebral white matter is nonspecific, but consistent with chronic small vessel ischemic disease. A small chronic lacunar infarct within the right frontal lobe subcortical white matter is new from prior MRI (series 6, image 19). Mild generalized parenchymal atrophy. Vascular: Flow voids maintained within the proximal large arterial vessels. Skull and upper cervical spine: No focal marrow lesion. Incompletely assessed upper cervical spondylosis. Sinuses/Orbits: Visualized orbits demonstrate no acute abnormality. Mild scattered paranasal sinus mucosal thickening. No significant mastoid effusion. IMPRESSION: No evidence of acute intracranial abnormality. Mild generalized parenchymal  atrophy and chronic small vessel ischemic disease. A small chronic lacunar infarct within the right frontal lobe subcortical white matter is new since MRI 05/16/2018. Electronically Signed   By: Jackey Loge DO   On: 08/29/2019 08:35   DG CHEST PORT 1 VIEW  Result Date: 08/29/2019 CLINICAL DATA:  Seizure. EXAM: PORTABLE CHEST 1 VIEW COMPARISON:  Radiograph 06/28/2019. Chest CT 10/14/2018. Prior imaging at [facility] FINDINGS: Mild cardiomegaly is similar to prior exam. No acute airspace disease. No pulmonary edema, large pleural effusion or pneumothorax. Patient's chin partially obscures the apices. No acute osseous abnormalities are seen. IMPRESSION: Stable mild cardiomegaly. No acute abnormality. Electronically Signed   By: Narda Rutherford M.D.   On: 08/29/2019 06:14        Scheduled Meds: . enoxaparin (LOVENOX) injection  40 mg Subcutaneous Q24H  . folic acid  1 mg Oral Daily  . LORazepam  1 mg Intravenous Q6H  . multivitamin with minerals  1 tablet Oral Daily  . thiamine  100 mg Oral Daily   Or  . thiamine  100 mg Intravenous Daily   Continuous Infusions: . levETIRAcetam 1,000 mg (08/29/19 1017)  .  sodium chloride (hypertonic) 45 mL/hr (08/29/19 0856)     LOS: 0 days    Time spent: 35 minutes    Alberteen Samhristopher P Jakhai Fant, MD [facility] 08/29/2019, 1:30 PM     Please page though AMION or Epic secure chat:  For password, contact charge nurse

## 2019-08-29 NOTE — ED Notes (Signed)
EEG at bedside.

## 2019-08-29 NOTE — ED Notes (Addendum)
Date and time results received: 08/29/19 3:07 AM  (use smartphrase ".now" to insert current time)  Test: lactic acid Critical Value: 4.0  Name of Provider Notified: Charlann Lange, PA  Orders Received? Or Actions Taken?:

## 2019-08-29 NOTE — ED Notes (Signed)
Patient transported to MRI 

## 2019-08-29 NOTE — H&P (Addendum)
History and Physical    Evan Jacobs XNA:[id number] DOB: Jul 23, 1951 DOA: 08/29/2019  PCP: Patient, No Pcp Per Patient coming from: Home  Chief Complaint: Seizure  HPI: Evan Jacobs is a 68 y.o. male with medical history significant of intractable hiccups, hyponatremia, seizures, GERD with esophagitis, hypertension, alcohol abuse being brought to the ED via EMS for evaluation of a seizure.  Family reported to EMS that patient has a history of seizures.  Upon EMS arrival, patient was postictal.  Patient had another seizure in route with EMS and was given 2.5 mg midazolam.  CBG 121.  Patient is currently nonverbal and no history could be obtained from him.  Of note, per care everywhere patient has a history of seizures and is on Keppra 500 mg twice daily.  He was admitted in November 2020 to [facility] for hyponatremia.  ED Course: Afebrile.  No leukocytosis.  Lactic acid 4.0.  Sodium 124, potassium 3.2, chloride 84.  BUN 5, creatinine 0.8.  AST 50, T bili 1.3.  ALT and alk phos normal.  Blood ethanol level undetectable.  UDS positive for benzodiazepines.  UA not suggestive of infection.  Magnesium level 1.2.  SARS-CoV-2 PCR test pending.  Head CT negative for acute intracranial abnormality. Patient received IV magnesium sulfate 2 g and a 500 cc normal saline bolus.  Review of Systems:  All systems reviewed and apart from history of presenting illness, are negative.  Past Medical History:  Diagnosis Date  . Acid reflux   . Gout   . Hypertension     Past surgical history: Unable to obtain any history from the patient at this time as he is nonverbal.   reports that he has never smoked. He has never used smokeless tobacco. He reports current alcohol use. He reports that he does not use drugs.  No Known Allergies  Family history: Unable to obtain any history from the patient at this time as he is nonverbal.  Prior to Admission medications   Medication Sig Start Date End  Date Taking? Authorizing Provider  omeprazole (PRILOSEC) 20 MG capsule Take 1 capsule (20 mg total) by mouth daily. 11/01/17   Quintella Reichert, MD  promethazine (PHENERGAN) 25 MG tablet Take 1 tablet (25 mg total) by mouth every 6 (six) hours as needed for nausea or vomiting. 11/01/17   Quintella Reichert, MD    Physical Exam: Vitals:   08/29/19 0230 08/29/19 0330 08/29/19 0430 08/29/19 0502  BP: (!) 161/76 (!) 169/82 (!) 174/83 (!) 167/84  Pulse: 66 73 85 82  Resp: (!) 28 20 (!) 22 (!) 22  Temp:      TempSrc:      SpO2: 99% 99% 99% 98%    Physical Exam  Constitutional: He appears well-developed and well-nourished. No distress.  HENT:  Head: Normocephalic.  Eyes: Pupils are equal, round, and reactive to light.  Cardiovascular: Normal rate, regular rhythm and intact distal pulses.  Pulmonary/Chest: Effort normal and breath sounds normal. No respiratory distress. He has no wheezes. He has no rales.  Abdominal: Soft. Bowel sounds are normal. He exhibits no distension. There is no abdominal tenderness. There is no guarding.  Musculoskeletal:        General: No edema.     Cervical back: Neck supple.  Neurological:  Awake Not alert Not following any commands Nonverbal Babinski downgoing bilaterally  Skin: Skin is warm and dry. He is not diaphoretic.     Labs on Admission: I have personally reviewed following labs and  imaging studies  CBC: Recent Labs  Lab 08/29/19 0214  WBC 4.8  NEUTROABS 2.7  HGB 13.5  HCT 41.6  MCV 78.6*  PLT 161   Basic Metabolic Panel: Recent Labs  Lab 08/29/19 0214 08/29/19 0239  NA 124*  --   K 3.2*  --   CL 84*  --   CO2 26  --   GLUCOSE 97  --   BUN 5*  --   CREATININE 0.85  --   CALCIUM 8.5*  --   MG  --  1.2*   GFR: CrCl cannot be calculated (Unknown ideal weight.). Liver Function Tests: Recent Labs  Lab 08/29/19 0214  AST 50*  ALT 15  ALKPHOS 45  BILITOT 1.3*  PROT 7.5  ALBUMIN 4.6   No results for input(s): LIPASE, AMYLASE  in the last 168 hours. No results for input(s): AMMONIA in the last 168 hours. Coagulation Profile: No results for input(s): INR, PROTIME in the last 168 hours. Cardiac Enzymes: No results for input(s): CKTOTAL, CKMB, CKMBINDEX, TROPONINI in the last 168 hours. BNP (last 3 results) No results for input(s): PROBNP in the last 8760 hours. HbA1C: No results for input(s): HGBA1C in the last 72 hours. CBG: No results for input(s): GLUCAP in the last 168 hours. Lipid Profile: No results for input(s): CHOL, HDL, LDLCALC, TRIG, CHOLHDL, LDLDIRECT in the last 72 hours. Thyroid Function Tests: No results for input(s): TSH, T4TOTAL, FREET4, T3FREE, THYROIDAB in the last 72 hours. Anemia Panel: No results for input(s): VITAMINB12, FOLATE, FERRITIN, TIBC, IRON, RETICCTPCT in the last 72 hours. Urine analysis:    Component Value Date/Time   COLORURINE YELLOW 08/29/2019 0214   APPEARANCEUR CLEAR 08/29/2019 0214   LABSPEC 1.009 08/29/2019 0214   PHURINE 6.0 08/29/2019 0214   GLUCOSEU NEGATIVE 08/29/2019 0214   HGBUR NEGATIVE 08/29/2019 0214   BILIRUBINUR NEGATIVE 08/29/2019 0214   KETONESUR NEGATIVE 08/29/2019 0214   PROTEINUR 100 (A) 08/29/2019 0214   NITRITE NEGATIVE 08/29/2019 0214   LEUKOCYTESUR NEGATIVE 08/29/2019 0214    Radiological Exams on Admission: CT Head Wo Contrast  Result Date: 08/29/2019 CLINICAL DATA:  Altered mental status. Seizure. EXAM: CT HEAD WITHOUT CONTRAST TECHNIQUE: Contiguous axial images were obtained from the base of the skull through the vertex without intravenous contrast. COMPARISON:  CT 05/29/2019, multiple priors. Prior imaging obtained at [facility]. FINDINGS: Brain: No intracranial hemorrhage, mass effect, or midline shift. Stable degree of generalized atrophy. No hydrocephalus. The basilar cisterns are patent. Unchanged mild chronic small vessel ischemia from prior exam. Possible tiny remote lacunar infarct in the right caudate. No evidence of territorial  infarct or acute ischemia. No extra-axial or intracranial fluid collection. Vascular: Atherosclerosis of skullbase vasculature without hyperdense vessel or abnormal calcification. Skull: No fracture or focal lesion. Sinuses/Orbits: Chronic left frontal sinusitis with chronic erosion of the anterior wall and diffuse cortical thickening. Findings are unchanged over multiple prior exams. Scattered mucosal thickening of ethmoid air cells. The mastoid air cells are clear. Leftward gaze, orbits are otherwise unremarkable. Other: None. IMPRESSION: 1. No acute intracranial abnormality. 2. Stable atrophy and chronic small vessel ischemia. 3. Chronic left frontal sinusitis, unchanged over multiple prior exams. Electronically Signed   By: Keith Rake M.D.   On: 08/29/2019 02:55    EKG: Independently reviewed.  Sinus rhythm, PR prolongation, LVH, QTC 545. Q waves in anterior leads similar to prior tracing from 10/2017.  Assessment/Plan Principal Problem:   Seizures ([place]) Active Problems:   Hyponatremia   Lactic acidosis  Hypokalemia   Hypomagnesemia   Breakthrough seizures Per review of care everywhere, patient has a history of seizure disorder and is on Keppra 500 mg twice daily.  He presented to the ED today after having a witnessed seizure at home.  He had another seizure in route with EMS and received midazolam.  Currently postictal.  Awake but not alert, not following any commands and nonverbal.  Breakthrough seizure could be due to several different etiologies including possible antiepileptic medication noncompliance, acute alcohol withdrawal, hyponatremia, and hypomagnesemia.  He has a history of alcohol use disorder and blood ethanol level undetectable at this time.  Also has a history of hyponatremia and sodium level low at 124.  Magnesium level low at 1.2.  Infectious etiology less likely given no fever or leukocytosis.  UA not suggestive of infection.  Head CT negative for acute intracranial  abnormality. -Seizure precautions -Aspiration precautions -Keep n.p.o. -EEG -Brain MRI -Management of hyponatremia and hypomagnesemia as mentioned below -Discussed with neurology.  Recommending Keppra loading dose 1 g followed by Keppra 1 g twice daily.  Recommending giving scheduled IV Ativan 2 mg every 6 hours x 4 doses, then 1 mg every 6 hours x 8 doses.  In addition, patient may need as needed Ativan per CIWA protocol. Neurology recommending transferring the patient to [facility] and will see the patient when he arrives.  Hyponatremia Sodium 124.  No recent labs for comparison.  Per care everywhere, patient was admitted to [facility] in November 2020 for severe hyponatremia/sodium 120 thought to be secondary to history of SIADH, poor oral intake, and noncompliance with his home sodium chloride tablets.  He was seen by nephrology during that hospitalization.  Patient is currently nonverbal and no history can be obtained from him.  Unclear if his hyponatremia is acute or subacute. -Received a 500 cc normal saline bolus in the ED. Continue normal saline at 100 cc/h.  Recheck sodium level now and continue to monitor every 4 hours.  Adjust rate of fluids accordingly.  May need hypertonic saline if no improvement in sodium.  In the setting of seizure activity, would try to increase serum sodium by 4 to 6 mEq over the next few hours. -Check serum osmolarity -Consult nephrology in a.m.  Lactic acidosis Likely related to seizure activity.  No fever or leukocytosis to suggest underlying infection.  UA not suggestive of infection. -Chest x-ray to rule out pneumonia -Trend lactate  Hypokalemia and hypomagnesemia Suspect related to history of alcohol abuse.  Potassium level 3.2 and magnesium level 1.2. -Cardiac monitoring -Replete potassium and magnesium.  Continue to monitor levels closely.  Elevated LFTs Suspect related to seizure activity.  AST 50, T bili 1.3.  ALT and alk phos  normal. -Continue to monitor LFTs  History of alcohol use disorder, concern for acute alcohol withdrawal Unable to obtain any history from the patient.  Blood ethanol level undetectable.  He had 2 seizures and blood pressure elevated. -CIWA protocol; Ativan as needed -Thiamine, folate, multivitamin -Monitor magnesium and phosphate levels  Uncontrolled hypertension Blood pressure elevated with systolic in the 614E 315Q. -Hydralazine as needed for SBP greater than 180  QT prolongation on EKG QTC 545. -Cardiac monitoring -Keep potassium above 4 and magnesium above 2 -Avoid QT prolonging drugs if possible -Repeat EKG in a.m.  DVT prophylaxis: Lovenox Code Status: Full code Family Communication: I tried calling patient's friend Niel Hummer who is listed as a contact under the chart but was not able to reach  this person over the phone.  No family available at bedside at this time. Disposition Plan: Anticipate discharge after clinical improvement. Consults called: Neurology (Dr. Cheral Marker) Admission status: It is my clinical opinion that admission to INPATIENT is reasonable and necessary in this 68 y.o. male . presenting with 2 breakthrough seizures and is currently postictal.  Patient is nonverbal and not following any commands.  He is being transferred to [facility] to be evaluated by neurology.  High risk of decompensation.  Given the aforementioned, the predictability of an adverse outcome is felt to be significant. I expect that the patient will require at least 2 midnights in the hospital to treat this condition.   The medical decision making on this patient was of high complexity and the patient is at high risk for clinical deterioration, therefore this is a level 3 visit.  Shela Leff MD [facility] Pager [phone]  If 7PM-7AM, please contact night-coverage [email or web address] Password [facility]  08/29/2019, 5:45 AM

## 2019-08-29 NOTE — Progress Notes (Signed)
Unable to screen patient for MRI, Per Dr Pascal Lux, Neuroradiologist, patient needs KUB to clear for MRI. Spoke with RN bhj

## 2019-08-29 NOTE — ED Notes (Signed)
Attempted to obtain 5am labs on pt but was unsuccessful. Wells Guiles, RN aware.

## 2019-08-29 NOTE — Consult Note (Signed)
Neurology Consultation  Reason for Consult: Seizure Referring Physician: Danford  CC: Seizure  History is obtained from: Chart as patient currently is postictal-there is no family member contact number thus called emergency contact who is a friend, but states he has not seen patient in a long time.     HPI: Evan Jacobs is a 68 y.o. male with history of hypertension gout acid reflux, alcohol abuse and history of alcohol withdrawal seizures on 500 mg twice daily of Keppra for seizures -unclear compliance with this medication.Today's alcohol level was less than 10.as noted above, I was unable to contact any family member/friend who could tell me if he has been still drinking heavily and recently stopped.  EMS states that they were called for seizure activity x1 while he was at home.  While in route patient had a second seizure and was given midazolam 2.5 mg.  Currently patient is postictal.  He has only received 2.5 mg of Versed in route with EMS and 2 mg of Ativan at 632 this a.m.  ED course: Administration of Keppra, MRI brain, CT head  Chart review-it is well-documented by [facility] healthcare back in 2018 patient has history of alcohol abuse and has history of alcohol abuse seizures.  Back at that point in time he was drinking 1/2 pint of liquor, most days.  Patient has multiple visits with elevated alcohol levels-in 11/2016 level of 90, 12/2016 level of 59, again on 12/2016 level of 33, 01/2017 alcohol level of 67.  Past Medical History:  Diagnosis Date  . Acid reflux   . Alcohol abuse   . Gout   . Hypertension   . Seizures (HCC)    Secondary to alcohol withdrawal   Due to postictal state unable to obtain family history .  Social History:   reports that he has never smoked. He has never used smokeless tobacco. He reports current alcohol use. He reports that he does not use drugs.  Medications  Current Facility-Administered Medications:  .  acetaminophen (TYLENOL) tablet 650 mg, 650 mg,  Oral, Q6H PRN **OR** acetaminophen (TYLENOL) suppository 650 mg, 650 mg, Rectal, Q6H PRN, John Giovanni, MD, 650 mg at 08/29/19 1405 .  enoxaparin (LOVENOX) injection 40 mg, 40 mg, Subcutaneous, Q24H, John Giovanni, MD, 40 mg at 08/29/19 1150 .  folic acid (FOLVITE) tablet 1 mg, 1 mg, Oral, Daily, John Giovanni, MD .  hydrALAZINE (APRESOLINE) injection 5 mg, 5 mg, Intravenous, Q4H PRN, John Giovanni, MD .  levETIRAcetam (KEPPRA) IVPB 1000 mg/100 mL premix, 1,000 mg, Intravenous, Q12H, John Giovanni, MD, Stopped at 08/29/19 1351 .  LORazepam (ATIVAN) injection 1 mg, 1 mg, Intravenous, Q6H, Ulice Dash, PA-C, 1 mg at 08/29/19 1151 .  LORazepam (ATIVAN) tablet 1-4 mg, 1-4 mg, Oral, Q1H PRN **OR** LORazepam (ATIVAN) injection 1-4 mg, 1-4 mg, Intravenous, Q1H PRN, John Giovanni, MD .  multivitamin with minerals tablet 1 tablet, 1 tablet, Oral, Daily, John Giovanni, MD .  sodium chloride (hypertonic) 3 % solution, , Intravenous, Continuous, Last Rate: 45 mL/hr at 08/29/19 0856, 45 mL/hr at 08/29/19 0856 **AND** hypertonic 3% sodium chloride pharmacy monitoring, , , Until Discontinued, Delano Metz, MD .  thiamine tablet 100 mg, 100 mg, Oral, Daily **OR** thiamine (B-1) injection 100 mg, 100 mg, Intravenous, Daily, John Giovanni, MD  Current Outpatient Medications:  .  amLODipine-valsartan (EXFORGE) 10-160 MG tablet, Take 1 tablet by mouth daily., Disp: , Rfl:  .  ascorbic acid (VITAMIN C) 500 MG tablet, Take 500 mg by mouth daily.,  Disp: , Rfl:  .  chlorproMAZINE (THORAZINE) 25 MG tablet, Take 25 mg by mouth 3 (three) times daily., Disp: , Rfl:  .  Cholecalciferol 25 MCG (1000 UT) capsule, Take 2,000 Units by mouth daily., Disp: , Rfl:  .  diltiazem (TIAZAC) 120 MG 24 hr capsule, Take 120 mg by mouth daily. , Disp: , Rfl:  .  iron polysaccharides (NIFEREX) 150 MG capsule, Take 150 mg by mouth daily., Disp: , Rfl:  .  levETIRAcetam (KEPPRA) 500 MG tablet, Take  500 mg by mouth 2 (two) times daily., Disp: , Rfl:  .  losartan (COZAAR) 50 MG tablet, Take 50 mg by mouth daily., Disp: , Rfl:  .  metoprolol tartrate (LOPRESSOR) 50 MG tablet, Take 50 mg by mouth 2 (two) times daily., Disp: , Rfl:  .  omeprazole (PRILOSEC) 40 MG capsule, Take 40 mg by mouth 2 (two) times daily., Disp: , Rfl:  .  sodium chloride 1 g tablet, Take 1 g by mouth 3 (three) times daily., Disp: , Rfl:  .  thiamine 100 MG tablet, Take 100 mg by mouth daily., Disp: , Rfl:  .  vitamin B-12 (CYANOCOBALAMIN) 500 MCG tablet, Take 500 mcg by mouth daily., Disp: , Rfl:   ROS:  Unable to obtain due postictal state.   Exam: Current vital signs: BP (!) 149/76   Pulse 84   Temp (!) 101.5 F (38.6 C)   Resp 19   SpO2 100%  Vital signs in last 24 hours: Temp:  [98.5 F (36.9 C)-102.1 F (38.9 C)] 101.5 F (38.6 C) (12/29 1430) Pulse Rate:  [58-92] 84 (12/29 1430) Resp:  [19-32] 19 (12/29 1430) BP: (126-176)/(72-94) 149/76 (12/29 1430) SpO2:  [96 %-100 %] 100 % (12/29 1430)   Constitutional: Appears well-developed and well-nourished.  Eyes: No scleral injection HENT: No OP obstrucion Head: Normocephalic.  Cardiovascular: Normal rate and regular rhythm.  Respiratory: Effort normal, non-labored breathing GI: Soft.  No distension. There is no tenderness.  Skin: WDI  Neuro: Mental Status: Patient is postictal, extremely lethargic to the point he is not following commands. Cranial Nerves: II: No blink to threat III,IV, VI: Disconjugate eyes, pupils equal round reactive 2 mm-1 mm V: Winces to noxious stimuli VII: Facial movement is symmetric.  VIII: hearing opens eyes to voice   Motor: Postictal with the ability to left forearms off the bed to noxious stimuli, and able to bend the knee with 3/5 strength to noxious stimuli in the plantar surface Sensory: With constant plantar stimulation patient will pull legs back at knee.  Winces to pain bilateral noxious stimuli in the  upper extremities Deep Tendon Reflexes: 2+ and symmetric in the biceps and no patellae.  Plantars: Toes are downgoing bilaterally. Cerebellar: Patient not following commands  Labs I have reviewed labs in epic and the results pertinent to this consultation are:   CBC    Component Value Date/Time   WBC 4.8 08/29/2019 0214   RBC 5.29 08/29/2019 0214   HGB 13.5 08/29/2019 0214   HCT 41.6 08/29/2019 0214   PLT 197 08/29/2019 0214   MCV 78.6 (L) 08/29/2019 0214   MCH 25.5 (L) 08/29/2019 0214   MCHC 32.5 08/29/2019 0214   RDW 16.6 (H) 08/29/2019 0214   LYMPHSABS 1.4 08/29/2019 0214   MONOABS 0.6 08/29/2019 0214   EOSABS 0.1 08/29/2019 0214   BASOSABS 0.0 08/29/2019 0214    CMP     Component Value Date/Time   NA 127 (L) 08/29/2019 1151  K 3.2 (L) 08/29/2019 1151   CL 92 (L) 08/29/2019 1151   CO2 26 08/29/2019 1151   GLUCOSE 92 08/29/2019 1151   BUN <5 (L) 08/29/2019 1151   CREATININE 0.75 08/29/2019 1151   CALCIUM 8.1 (L) 08/29/2019 1151   PROT 7.1 08/29/2019 1151   ALBUMIN 4.0 08/29/2019 1151   AST 40 08/29/2019 1151   ALT 12 08/29/2019 1151   ALKPHOS 41 08/29/2019 1151   BILITOT 1.4 (H) 08/29/2019 1151   GFRNONAA >60 08/29/2019 1151   GFRAA >60 08/29/2019 1151    Lipid Panel  No results found for: CHOL, TRIG, HDL, CHOLHDL, VLDL, LDLCALC, LDLDIRECT   Imaging I have reviewed the images obtained:  CT-scan of the brain:-No acute intracranial abnormality. Stable atrophy and chronic small vessel ischemia. Chronic left frontal sinusitis.  MRI examination of the brain:-No evidence of acute intracranial abnormality.  Mild generalized parenchymal atrophy and chronic small vessel ischemic disease. 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  Patient currently is on Keppra secondary to alcohol withdrawal seizures.  Unfortunately, no family members were able to be contacted to find out recent drinking history, however labs show his AST is triple his ALT.  He also shows lab work of hypokalemia and hyponatremia.  Currently patient is postictal but does withdraw from noxious stimuli.  While in the ED he has been loaded with Keppra and maintenance dose has been changed to 1000 mg twice daily.  His most likely these are provoked seizures are secondary to alcohol withdrawal.  Impression: -Seizures most likely provoked secondary to alcohol withdrawal  Recommendations: -Repeat lactate level -Hydrate with normal saline --significant hyopnatremia --increase sodium by no more than 10 points per day to prevent CPM -Follow electrolyte abnormalities and correct per primary team -Obtain thiamine level, then high dose thiamine. -Transfer to [facility] for [facility] -Decrease standing doses of Ativan to 1mg  q6h from 2mg    Addendum: 1440  I spoke with significant other.  She states that for quite a while he has been sleeping over her place but during the day he hangs out with his friends.  She knows that he drinks when he spends time with his friends as he often comes home inebriated.  She very much dislikes this and has tried multiple times to get him to stop, but when she brings this up he becomes very agitated and angry.  She does not know how much she has been drinking.  Over the last 2-1/2 days she has kept him at her house without allowing him to go to his friend's house.  Yesterday he did go to the hospital for intractable hiccups.  When she brought him home he continued to look unhealthy.  She became worried and put him in the car to drive him to the hospital.  As she was driving she noted he stiffened up and started drooling.  She pulled over to a  convenient store where EMS was called.  She states that this episode lasted for approximately 5 minutes and then he was unresponsive.   Attending Neurohospitalist Addendum Patient seen and examined with APP/Resident. Agree with the history and physical as documented above. Agree with the  plan as documented, which I helped formulate. I have independently reviewed the chart, obtained history, review of systems and examined the patient.I have personally reviewed pertinent head/neck/spine imaging (CT/MRI). Please feel free to call with any questions. --- Milon Dikes, MD [facility] Pager: [phone]  If 7pm to 7am, please call on call as listed on AMION.

## 2019-08-29 NOTE — ED Notes (Signed)
IV team at bedside 

## 2019-08-29 NOTE — Procedures (Signed)
Patient Name: Evan Jacobs  MRN: [id number]  Epilepsy Attending: Lora Havens  Referring Physician/Provider: Dr Derrick Ravel Date: 08/29/2019   Duration: 25.38 mins  Patient history: This 68 year old male with history of alcohol abuse along with alcohol withdrawal seizures who presented with seizure. EEG to evaluate for seizure   Level of alertness: awake, drowsy  AEDs during EEG study: LEV  Technical aspects: This EEG study was done with scalp electrodes positioned according to the 10-20 International system of electrode placement. Electrical activity was acquired at a sampling rate of 500Hz  and reviewed with a high frequency filter of 70Hz  and a low frequency filter of 1Hz . EEG data were recorded continuously and digitally stored.   DESCRIPTION: No clear posterior dominant rhythm was seen. EEG showed continuous generalized 3-6hz  theta-delta slowing. Hyperventilation and photic stimulation were not performed. Of note, eeg was technically difficult secondary to significant myogenic and eye flutter artifact.  ABNORMALITY - Continuous slow, generalized  IMPRESSION: This technically difficult study is suggestive of moderate diffuse encephalopathy, non specific to etiology. No seizures or epileptiform discharges were seen throughout the recording.  [facility] Barbra Sarks

## 2019-08-29 NOTE — ED Provider Notes (Signed)
8:31 AM Received a call from [facility] [phone]), listed point of contact for this patient, in response to a message left on her voicemail from [place].  She states she is a friend of the family and has only occasional contact with the patient. Has taken the patient to the hospital or appointments a couple times. Last saw the patient a couple weeks ago.  She states patient is typically ambulatory, fully verbal, and has no known deficits. She told me to had to get off the phone as she was at work. When she is able, she will call back with family contact information.     Lorayne Bender, PA-C 08/29/19 [place], [place], DO 08/29/19 1106

## 2019-08-29 NOTE — ED Notes (Signed)
Per bed placement at [facility] have 3 discharges on the Neuro floor, patient is a priority for transfer as soon as a bed becomes available

## 2019-08-29 NOTE — Progress Notes (Signed)
Called re: abnormal labs.  Na 106, K>7.5, Cr and Ca now undetectable.  These are marked severe changes since prior 4 hours ago (which were consistent with 4 hours prior), and I think this is artifact.    Hold K infusion for now, repeat labs stat, venipuncture only.

## 2019-08-30 ENCOUNTER — Other Ambulatory Visit: Payer: Self-pay

## 2019-08-30 DIAGNOSIS — F10231 Alcohol dependence with withdrawal delirium: Secondary | ICD-10-CM

## 2019-08-30 LAB — URINE CULTURE: Culture: NO GROWTH

## 2019-08-30 MED ORDER — CHLORPROMAZINE HCL 25 MG PO TABS
25.0000 mg | ORAL_TABLET | Freq: Three times a day (TID) | ORAL | Status: DC
  Administered 2019-08-30 – 2019-08-31 (×2): 25 mg via ORAL
  Filled 2019-08-30 (×4): qty 1

## 2019-08-30 MED ORDER — DILTIAZEM HCL ER BEADS 120 MG PO CP24: 120.0000 mg | ORAL_CAPSULE | Freq: Every day | ORAL | Status: DC

## 2019-08-30 MED ORDER — CHLORHEXIDINE GLUCONATE CLOTH 2 % EX PADS
6.0000 | MEDICATED_PAD | Freq: Every day | CUTANEOUS | Status: DC
  Administered 2019-08-31: 09:00:00 6 via TOPICAL

## 2019-08-30 MED ORDER — PANTOPRAZOLE SODIUM 40 MG PO TBEC
40.0000 mg | DELAYED_RELEASE_TABLET | Freq: Every day | ORAL | Status: DC
  Administered 2019-08-30 – 2019-08-31 (×2): 40 mg via ORAL
  Filled 2019-08-30 (×2): qty 1

## 2019-08-30 MED ORDER — AMLODIPINE BESYLATE-VALSARTAN 10-160 MG PO TABS: 1.0000 | ORAL_TABLET | Freq: Every day | ORAL | Status: DC

## 2019-08-30 MED ORDER — METOPROLOL TARTRATE 50 MG PO TABS
50.0000 mg | ORAL_TABLET | Freq: Two times a day (BID) | ORAL | Status: DC
  Administered 2019-08-30 – 2019-08-31 (×2): 50 mg via ORAL
  Filled 2019-08-30 (×2): qty 1

## 2019-08-30 MED ORDER — HYDRALAZINE HCL 20 MG/ML IJ SOLN: 10.0000 mg | INTRAMUSCULAR | Status: DC | PRN

## 2019-08-30 MED ORDER — IRBESARTAN 150 MG PO TABS
150.0000 mg | ORAL_TABLET | Freq: Every day | ORAL | Status: DC
  Administered 2019-08-30 – 2019-08-31 (×2): 150 mg via ORAL
  Filled 2019-08-30 (×2): qty 1

## 2019-08-30 MED ORDER — LEVETIRACETAM 500 MG PO TABS
1000.0000 mg | ORAL_TABLET | Freq: Two times a day (BID) | ORAL | Status: DC
  Administered 2019-08-30 – 2019-08-31 (×2): 1000 mg via ORAL
  Filled 2019-08-30 (×2): qty 2

## 2019-08-30 MED ORDER — VITAMIN D 25 MCG (1000 UNIT) PO TABS
2000.0000 [IU] | ORAL_TABLET | Freq: Every day | ORAL | Status: DC
  Administered 2019-08-30 – 2019-08-31 (×2): 2000 [IU] via ORAL
  Filled 2019-08-30 (×5): qty 2

## 2019-08-30 MED ORDER — CHOLECALCIFEROL 25 MCG (1000 UT) PO CAPS: 2000.0000 [IU] | ORAL_CAPSULE | Freq: Every day | ORAL | Status: DC

## 2019-08-30 MED ORDER — VITAMIN B-12 100 MCG PO TABS
500.0000 ug | ORAL_TABLET | Freq: Every day | ORAL | Status: DC
  Administered 2019-08-30 – 2019-08-31 (×2): 500 ug via ORAL
  Filled 2019-08-30 (×2): qty 5

## 2019-08-30 MED ORDER — AMLODIPINE BESYLATE 10 MG PO TABS
10.0000 mg | ORAL_TABLET | Freq: Every day | ORAL | Status: DC
  Administered 2019-08-30 – 2019-08-31 (×2): 10 mg via ORAL
  Filled 2019-08-30 (×2): qty 1

## 2019-08-30 MED ORDER — DILTIAZEM HCL ER COATED BEADS 120 MG PO CP24
120.0000 mg | ORAL_CAPSULE | Freq: Every day | ORAL | Status: DC
  Administered 2019-08-30 – 2019-08-31 (×2): 120 mg via ORAL
  Filled 2019-08-30 (×2): qty 1

## 2019-08-30 MED ORDER — HYDRALAZINE HCL 20 MG/ML IJ SOLN
10.0000 mg | Freq: Once | INTRAMUSCULAR | Status: AC
  Administered 2019-08-30: 22:00:00 10 mg via INTRAVENOUS

## 2019-08-30 MED ORDER — ASCORBIC ACID 500 MG PO TABS
500.0000 mg | ORAL_TABLET | Freq: Every day | ORAL | Status: DC
  Administered 2019-08-30 – 2019-08-31 (×2): 500 mg via ORAL
  Filled 2019-08-30 (×2): qty 1

## 2019-08-30 NOTE — Progress Notes (Signed)
[facility] Progress Note  Subjective: fully alert and talking today, knows year and place  Vitals:   08/30/19 0540 08/30/19 0612 08/30/19 0804 08/30/19 0807  BP: (!) 158/70 (!) 158/70 (!) 180/79 (!) 168/80  Pulse:  82 78 77  Resp:  18 20   Temp:  97.6 F (36.4 C) 98.2 F (36.8 C)   TempSrc:  Oral Oral   SpO2:   100% 100%  Weight:  70.6 kg    Height:  5\' 6"  (1.676 m)      Exam: Gen pt is alert and interacting today, much better No jvd or bruits  Chest clear bilat no rales or wheezing RRR no MRG Abd soft ntnd no mass or ascites +bs Ext no LE edema Neuro NF, Ox 3    Home meds:  - amlodipine-valsartan 10-160 qd/ diltiazem 120 qd/ losartan 50 qd/ metoprolol 50 bid  - omeprazole 40 bid  - sod chloride 1gm tid  - levetiracetam 500 bid  - chlorpromazine 25 tid    Na 124 K 2.8  BUN < 5  Cr 0.75  Alb 4.0  Hb 13 WBC 4k    UA negative    CXR - no active disease  Assessment/ Plan: 1. Hyponatremia - not edematous, no hx CHF/ renal failure or liver failure. Alb 4.0.  Looks euvolemic today. Na corrected w/ 12 hr 3% saline infusion, Na+ 131 today. Uosm was low, looked vol depleted on admission (^alb, Hb).  Would continue IVF's another 24 hrs unless he is taking in good po.  No further suggestions.  Will sign off.  2. AMS - postictal most likely, resolved, alert today 3. Seizure - per primary, was on Keppra at home 4. H/o etoh abuse 5. HTN - OK to resume home metoprolol,  norvasc and /or diltiazem. Would suggest trying to avoid ARB/ ACEi w/ his history of hyponatremia, there is chance they could be contributing.    Rob Doctor, hospital 08/30/2019, 10:29 AM    Inpatient medications: . Chlorhexidine Gluconate Cloth  6 each Topical Daily  . enoxaparin (LOVENOX) injection  40 mg Subcutaneous Q24H  . folic acid  1 mg Oral Daily  . multivitamin with minerals  1 tablet Oral Daily   . sodium chloride 75 mL/hr at 08/30/19 0600  . levETIRAcetam 1,000 mg (08/30/19 0935)  .  thiamine injection 500 mg (08/30/19 5093)   acetaminophen **OR** acetaminophen, hydrALAZINE, LORazepam **OR** LORazepam

## 2019-08-30 NOTE — ED Notes (Signed)
Patient is resting comfortably. 

## 2019-08-30 NOTE — Progress Notes (Signed)
Patient trying to located a telephone # in bed. Found # for him in the chart to call his contact person. Made the call and the patient talked to their family member. No complaints of discomfort.

## 2019-08-30 NOTE — Progress Notes (Signed)
Admitted from [facility] by ambulance.  Oriented to room and surroundings.  Siderails padded and suction set up at bedside for safety.

## 2019-08-30 NOTE — ED Notes (Signed)
Pt attempted to get out pf bed and pull at wires for monitoring. Provided pt a urinal and assisted with toileting

## 2019-08-30 NOTE — Progress Notes (Signed)
Neurology Progress Note   S:// Patient seen and examined in [address] Awake alert cooperative with the exam. Offers no complaints. Does not know why he is in the hospital but knows that he is in the hospital.   O:// Current vital signs: BP (!) 168/80   Pulse 77   Temp 98.2 F (36.8 C) (Oral)   Resp 20   Ht 5\' 6"  (1.676 m)   Wt 70.6 kg   SpO2 100%   BMI 25.12 kg/m  Vital signs in last 24 hours: Temp:  [97.6 F (36.4 C)-101.8 F (38.8 C)] 98.2 F (36.8 C) (12/30 0804) Pulse Rate:  [69-92] 77 (12/30 0807) Resp:  [17-32] 20 (12/30 0804) BP: (128-186)/(60-89) 168/80 (12/30 0807) SpO2:  [96 %-100 %] 100 % (12/30 0807) Weight:  [70.6 kg] 70.6 kg (12/30 0612) General: Well-developed well-nourished man in no apparent distress sitting comfortably in bed HEENT: Normocephalic atraumatic dry mucous membranes with no neck stiffness CVS: Regular rate rhythm Respiratory: Breathing comfortably and saturating well on room air Extremities: Warm well perfused without edema.  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  Could not tell me the month or date but could tell me that the most recent holiday that this past was Christmas. He is slow to respond to questions.  Has poor attention concentration. Takes multiple attempts to get him to follow commands but he follows commands consistently. Naming comprehension and repetition were all intact although slow to respond to those questions as well. Cranial nerves: Pupils equal round reactive light, extraocular movements intact, visual fields full, face appears symmetric, auditory acuity appears to be diminished bilaterally, tongue and palate midline, shoulder shrug intact. Motor exam: Antigravity in all fours without drift Sensory exam: Intact to light touch all over without extinction Coordination: Intact  finger-nose-finger testing.   Medications  Current Facility-Administered Medications:  .  0.9 %  sodium chloride infusion, , Intravenous, Continuous, 03-23-1974, MD, Last Rate: 75 mL/hr at 08/30/19 0600, Rate Verify at 08/30/19 0600 .  acetaminophen (TYLENOL) tablet 650 mg, 650 mg, Oral, Q6H PRN **OR** acetaminophen (TYLENOL) suppository 650 mg, 650 mg, Rectal, Q6H PRN, 09/01/19, MD, 650 mg at 08/29/19 1405 .  Chlorhexidine Gluconate Cloth 2 % PADS 6 each, 6 each, Topical, Daily, Danford, Christopher P, MD .  enoxaparin (LOVENOX) injection 40 mg, 40 mg, Subcutaneous, Q24H, 08/31/19, MD, 40 mg at 08/29/19 1150 .  folic acid (FOLVITE) tablet 1 mg, 1 mg, Oral, Daily, 08/31/19, MD .  hydrALAZINE (APRESOLINE) injection 5 mg, 5 mg, Intravenous, Q4H PRN, John Giovanni, MD .  levETIRAcetam (KEPPRA) IVPB 1000 mg/100 mL premix, 1,000 mg, Intravenous, Q12H, John Giovanni, MD, Stopped at 08/30/19 0002 .  LORazepam (ATIVAN) tablet 1-4 mg, 1-4 mg, Oral, Q1H PRN **OR** LORazepam (ATIVAN) injection 1-4 mg, 1-4 mg, Intravenous, Q1H PRN, 09/01/19, MD, 2 mg at 08/29/19 1744 .  multivitamin with minerals tablet 1 tablet, 1 tablet, Oral, Daily, 08/31/19, MD .  thiamine 500mg  in normal saline (80ml) IVPB, 500 mg, Intravenous, Q8H, , MD, Last Rate: 100 mL/hr at 08/30/19 0627, 500 mg at 08/30/19 0627 Labs CBC    Component Value Date/Time   WBC 4.8 08/29/2019 0214   RBC 5.29 08/29/2019 0214   HGB 13.5 08/29/2019 0214   HCT 41.6 08/29/2019 0214   PLT 197 08/29/2019 0214   MCV 78.6 (  L) 08/29/2019 0214   MCH 25.5 (L) 08/29/2019 0214   MCHC 32.5 08/29/2019 0214   RDW 16.6 (H) 08/29/2019 0214   LYMPHSABS 1.4 08/29/2019 0214   MONOABS 0.6 08/29/2019 0214   EOSABS 0.1 08/29/2019 0214   BASOSABS 0.0 08/29/2019 0214    CMP     Component Value Date/Time   NA 131 (L) 08/29/2019 1902   K 3.6 08/29/2019 1902   CL 98 08/29/2019 1902    CO2 25 08/29/2019 1902   GLUCOSE 96 08/29/2019 1902   BUN <5 (L) 08/29/2019 1902   CREATININE 0.90 08/29/2019 1902   CALCIUM 8.3 (L) 08/29/2019 1902   PROT 7.1 08/29/2019 1151   ALBUMIN 4.0 08/29/2019 1151   AST 40 08/29/2019 1151   ALT 12 08/29/2019 1151   ALKPHOS 41 08/29/2019 1151   BILITOT 1.4 (H) 08/29/2019 1151   GFRNONAA >60 08/29/2019 1902   GFRAA >60 08/29/2019 1902   EEG with moderate to diffuse encephalopathy done yesterday afternoon.  Imaging I have reviewed images in epic and the results pertinent to this consultation are: MRI brain with generalized atrophy and no acute changes.  Assessment: 68 year old man with history of alcohol abuse, documented alcohol withdrawal seizures in 2018 in 2017, supposed to be on Keppra for alcohol withdrawal seizures, presented for evaluation of multiple seizures. According to family members that were able to be reached later in the day, they said that he has not been drinking for the past 2-1/2 days prior to presentation.  On an average day he drinks every night with his friends.  He told me today that he drinks about a pint of vodka every day and had not been drinking prior to presentation and does not remember why he is in the hospital. He was encephalopathic for a prolonged period of time-likely prolonged postictal phase due to multiple seizures and possible underlying low brain reserve and changes that occur with chronic alcoholism. He seems to be coming around and nearing his baseline.  Impression: Alcohol withdrawal seizure, possible status epilepticus-resolved Alcohol abuse   Recommendations: I would continue on Keppra 1 g twice daily Maintain seizure precautions High-dose thiamine replacement Maintain seizure precautions Alcohol cessation planning per primary team and primary care provider as an outpatient I discussed with him the importance of abstaining from excessive alcohol consumption and sudden stoppage when he has been  drinking for a long time for the fear of provoking alcohol withdrawal seizures. He verbalized understanding but at this point with his poor attention concentration, I am not sure if he was able to retain all the information. This should be communicated to the family by the primary team, around the time of discharge. No further neurological work-up indicated at this time. Neurology will be available as needed.  Please call with questions.  -- Amie Portland, MD [facility] Pager: [phone] If 7pm to 7am, please call on call as listed on AMION.

## 2019-08-30 NOTE — Progress Notes (Signed)
PROGRESS NOTE    Evan Jacobs  FVC:[id number] DOB: 06-Aug-1951 DOA: 08/29/2019 PCP: Patient, No Pcp Per   Brief Narrative:  Evan Jacobs is a 68 y.o. male with medical history significant of intractable hiccups, hyponatremia, seizures, GERD with esophagitis, hypertension, alcohol abuse being brought to the ED via EMS for evaluation of a seizure.  Family reported to EMS that patient has a history of seizures.  Upon EMS arrival, patient was postictal.  Patient had another seizure in route with EMS and was given 2.5 mg midazolam.  He was found to be hyponeutropenic and in postictal state.  Subjective: Patient was feeling better when seen this morning.  He thinks he is back to baseline.  Assessment & Plan:   Principal Problem:   Seizures (HCC) Active Problems:   Hyponatremia   Lactic acidosis   Hypokalemia   Hypomagnesemia  Seizures with prolonged postictal state.  Patient is back to his baseline. According to patient he was taking his medications regularly.  He has an history of seizures and alcohol abuse.  Blood ethanol levels were undetectable on admission.  Neurology think there might be some element of alcohol withdrawal seizures. -Keppra dose was increased to 1 g twice daily, switch IV to p.o. -Continue seizure precautions. -PT OT consult for home health needs.  Hyponatremia.  Sodium was 124 on presentation.  Labs consistent with hypovolemic hyponatremia.  He was given a dose of 3% normal saline. Sodium improving, at 131 today. -Continue normal saline for another day.  Alcohol abuse.  There was some concern for alcohol withdrawal seizures complicating his seizure disorder. -Counseling was provided for alcohol cessation. -Continue thiamine. -Continue CIWA protocol.  Hypokalemia and hypomagnesemia.  Electrolytes were repleted yesterday. Potassium at 3.6 this morning. -Recheck CMP and magnesium tomorrow morning.  Elevated LFTs Suspect related to seizure activity. -Repeat  CMP tomorrow morning.  Lactic acidosis.  Most likely secondary to seizures. Other sign of infection. Resolved.  Uncontrolled hypertension.  Pressure elevated. Patient is on a combination pill of amlodipine and valsartan-which is continued. Apparently he was also taking losartan which was discontinued. Restart home dose of diltiazem and metoprolol.  QT prolongation on EKG. QTC 545. -Cardiac monitoring -Keep potassium above 4 and magnesium above 2 -Avoid QT prolonging drugs if possible.  Objective: Vitals:   08/30/19 0804 08/30/19 0807 08/30/19 1234 08/30/19 1603  BP: (!) 180/79 (!) 168/80 (!) 160/76 (!) 170/77  Pulse: 78 77 83 79  Resp: 20  19 17   Temp: 98.2 F (36.8 C)  97.8 F (36.6 C) 98.9 F (37.2 C)  TempSrc: Oral  Oral Oral  SpO2: 100% 100% 100% 100%  Weight:      Height:        Intake/Output Summary (Last 24 hours) at 08/30/2019 1703 Last data filed at 08/30/2019 1500 Gross per 24 hour  Intake 1916.68 ml  Output 700 ml  Net 1216.68 ml   Filed Weights   08/30/19 0612  Weight: 70.6 kg    Examination:  General exam: Appears calm and comfortable  Respiratory system: Clear to auscultation. Respiratory effort normal. Cardiovascular system: S1 & S2 heard, RRR. No JVD, murmurs, rubs, gallops or clicks. Gastrointestinal system: Soft, nontender, nondistended, bowel sounds positive. Central nervous system: Alert and oriented. No focal neurological deficits.right hand with some contractures and muscle wasting. Extremities: No edema, no cyanosis, pulses intact and symmetrical. Skin: No rashes, lesions or ulcers Psychiatry: Judgement and insight appear normal. Mood & affect appropriate.    DVT prophylaxis: Lovenox Code Status: Full  Family Communication: No family at bedside Disposition Plan: Pending improvement.  Consultants:   Neurology  Nephrology  Procedures:  Antimicrobials:   Data Reviewed: I have personally reviewed following labs and imaging  studies  CBC: Recent Labs  Lab 08/29/19 0214  WBC 4.8  NEUTROABS 2.7  HGB 13.5  HCT 41.6  MCV 78.6*  PLT 197   Basic Metabolic Panel: Recent Labs  Lab 08/29/19 0214 08/29/19 0239 08/29/19 0515 08/29/19 1005 08/29/19 1151 08/29/19 1902  NA 124*  --  124* QUESTIONABLE RESULTS, RECOMMEND RECOLLECT TO VERIFY 127* 131*  K 3.2*  --  2.8* QUESTIONABLE RESULTS, RECOMMEND RECOLLECT TO VERIFY 3.2* 3.6  CL 84*  --  85* QUESTIONABLE RESULTS, RECOMMEND RECOLLECT TO VERIFY 92* 98  CO2 26  --  25 QUESTIONABLE RESULTS, RECOMMEND RECOLLECT TO VERIFY 26 25  GLUCOSE 97  --  100* QUESTIONABLE RESULTS, RECOMMEND RECOLLECT TO VERIFY 92 96  BUN 5*  --  <5* QUESTIONABLE RESULTS, RECOMMEND RECOLLECT TO VERIFY <5* <5*  CREATININE 0.85  --  0.82 QUESTIONABLE RESULTS, RECOMMEND RECOLLECT TO VERIFY 0.75 0.90  CALCIUM 8.5*  --  8.3* QUESTIONABLE RESULTS, RECOMMEND RECOLLECT TO VERIFY 8.1* 8.3*  MG  --  1.2*  --  QUESTIONABLE RESULTS, RECOMMEND RECOLLECT TO VERIFY  --   --   PHOS  --   --   --  QUESTIONABLE RESULTS, RECOMMEND RECOLLECT TO VERIFY  --   --    GFR: Estimated Creatinine Clearance: 68.9 mL/min (by C-G formula based on SCr of 0.9 mg/dL). Liver Function Tests: Recent Labs  Lab 08/29/19 0214 08/29/19 1005 08/29/19 1151  AST 50* QUESTIONABLE RESULTS, RECOMMEND RECOLLECT TO VERIFY 40  ALT 15 QUESTIONABLE RESULTS, RECOMMEND RECOLLECT TO VERIFY 12  ALKPHOS 45 QUESTIONABLE RESULTS, RECOMMEND RECOLLECT TO VERIFY 41  BILITOT 1.3* QUESTIONABLE RESULTS, RECOMMEND RECOLLECT TO VERIFY 1.4*  PROT 7.5 QUESTIONABLE RESULTS, RECOMMEND RECOLLECT TO VERIFY 7.1  ALBUMIN 4.6 QUESTIONABLE RESULTS, RECOMMEND RECOLLECT TO VERIFY 4.0   No results for input(s): LIPASE, AMYLASE in the last 168 hours. Recent Labs  Lab 08/29/19 1902  AMMONIA 19   Coagulation Profile: No results for input(s): INR, PROTIME in the last 168 hours. Cardiac Enzymes: No results for input(s): CKTOTAL, CKMB, CKMBINDEX, TROPONINI in  the last 168 hours. BNP (last 3 results) No results for input(s): PROBNP in the last 8760 hours. HbA1C: No results for input(s): HGBA1C in the last 72 hours. CBG: No results for input(s): GLUCAP in the last 168 hours. Lipid Profile: No results for input(s): CHOL, HDL, LDLCALC, TRIG, CHOLHDL, LDLDIRECT in the last 72 hours. Thyroid Function Tests: No results for input(s): TSH, T4TOTAL, FREET4, T3FREE, THYROIDAB in the last 72 hours. Anemia Panel: No results for input(s): VITAMINB12, FOLATE, FERRITIN, TIBC, IRON, RETICCTPCT in the last 72 hours. Sepsis Labs: Recent Labs  Lab 08/29/19 0214 08/29/19 1005 08/29/19 1151  LATICACIDVEN 4.0* QUESTIONABLE RESULTS, RECOMMEND RECOLLECT TO VERIFY 1.5    Recent Results (from the past 240 hour(s))  Urine culture     Status: None   Collection Time: 08/29/19  2:14 AM   Specimen: Urine, Clean Catch  Result Value Ref Range Status   Specimen Description   Final    URINE, CLEAN CATCH Performed at [facility], [address] [place], [place] [address]    Special Requests   Final    NONE Performed at [facility], [address], [place], [place] [address]    Culture   Final    NO GROWTH  Performed at [facility], [address] [place], [place] [address]    Report Status 08/30/2019 FINAL  Final  SARS CORONAVIRUS 2 (TAT 6-24 HRS) Nasopharyngeal Nasopharyngeal Swab     Status: None   Collection Time: 08/29/19  3:47 AM   Specimen: Nasopharyngeal Swab  Result Value Ref Range Status   SARS Coronavirus 2 NEGATIVE NEGATIVE Final    Comment: (NOTE) SARS-CoV-2 target nucleic acids are NOT DETECTED. The SARS-CoV-2 RNA is generally detectable in upper and lower respiratory specimens during the acute phase of infection. Negative results do not preclude SARS-CoV-2 infection, do not rule out co-infections with other pathogens, and should not be used as the sole basis for treatment or other patient  management decisions. Negative results must be combined with clinical observations, patient history, and epidemiological information. The expected result is Negative. Fact Sheet for Patients: HairSlick.nohttps://www.fda.gov/media/138098/download Fact Sheet for Healthcare Providers: quierodirigir.comhttps://www.fda.gov/media/138095/download This test is not yet approved or cleared by the [place] FDA and  has been authorized for detection and/or diagnosis of SARS-CoV-2 by FDA under an Emergency Use Authorization (EUA). This EUA will remain  in effect (meaning this test can be used) for the duration of the COVID-19 declaration under Section 56 4(b)(1) of the Act, 21 U.S.C. section 360bbb-3(b)(1), unless the authorization is terminated or revoked sooner. Performed at [facility], [address] [place], [place] [address]      Radiology Studies: DG Abdomen 1 View  Result Date: 08/29/2019 CLINICAL DATA:  Pre MRI. EXAM: ABDOMEN - 1 VIEW COMPARISON:  Radiograph 01/03/2019 FINDINGS: No radiopaque foreign body or metallic medical device projects over the abdomen to preclude MRI imaging. Scattered air throughout nondilated small bowel in the central abdomen. EKG lead projects over the lower chest. Pelvic calcifications likely phleboliths. Unremarkable osseous structures. IMPRESSION: No radiopaque foreign body projects over the abdomen to preclude MRI imaging. Electronically Signed   By: Narda RutherfordMelanie  Sanford M.D.   On: 08/29/2019 06:59   CT Head Wo Contrast  Result Date: 08/29/2019 CLINICAL DATA:  Altered mental status. Seizure. EXAM: CT HEAD WITHOUT CONTRAST TECHNIQUE: Contiguous axial images were obtained from the base of the skull through the vertex without intravenous contrast. COMPARISON:  CT 05/29/2019, multiple priors. [facility]. FINDINGS: Brain: No intracranial hemorrhage, mass effect, or midline shift. Stable degree of generalized atrophy. No hydrocephalus. The basilar cisterns are  patent. Unchanged mild chronic small vessel ischemia from prior exam. Possible tiny remote lacunar infarct in the right caudate. No evidence of territorial infarct or acute ischemia. No extra-axial or intracranial fluid collection. Vascular: Atherosclerosis of skullbase vasculature without hyperdense vessel or abnormal calcification. Skull: No fracture or focal lesion. Sinuses/Orbits: Chronic left frontal sinusitis with chronic erosion of the anterior wall and diffuse cortical thickening. Findings are unchanged over multiple prior exams. Scattered mucosal thickening of ethmoid air cells. The mastoid air cells are clear. Leftward gaze, orbits are otherwise unremarkable. Other: None. IMPRESSION: 1. No acute intracranial abnormality. 2. Stable atrophy and chronic small vessel ischemia. 3. Chronic left frontal sinusitis, unchanged over multiple prior exams. Electronically Signed   By: Narda RutherfordMelanie  Sanford M.D.   On: 08/29/2019 02:55   MR BRAIN WO CONTRAST  Result Date: 08/29/2019 CLINICAL DATA:  Encephalopathy. EXAM: MRI HEAD WITHOUT CONTRAST TECHNIQUE: Multiplanar, multiecho pulse sequences of the brain and surrounding structures were obtained without intravenous contrast. COMPARISON:  Head CT performed earlier the same day 08/29/2019, brain MRI 05/16/2018 FINDINGS: Brain: There is no evidence of acute infarct.  No evidence of intracranial mass. No midline shift or extra-axial fluid collection. No chronic intracranial blood products. Mild patchy and ill-defined T2/FLAIR hyperintensity within the cerebral white matter is nonspecific, but consistent with chronic small vessel ischemic disease. A small chronic lacunar infarct within the right frontal lobe subcortical white matter is new from prior MRI (series 6, image 19). Mild generalized parenchymal atrophy. Vascular: Flow voids maintained within the proximal large arterial vessels. Skull and upper cervical spine: No focal marrow lesion. Incompletely assessed upper  cervical spondylosis. Sinuses/Orbits: Visualized orbits demonstrate no acute abnormality. Mild scattered paranasal sinus mucosal thickening. No significant mastoid effusion. IMPRESSION: No evidence of acute intracranial abnormality. Mild generalized parenchymal atrophy and chronic small vessel ischemic disease. A small chronic lacunar infarct within the right frontal lobe subcortical white matter is new since MRI 05/16/2018. Electronically Signed   By: Kellie Simmering DO   On: 08/29/2019 08:35   DG CHEST PORT 1 VIEW  Result Date: 08/29/2019 CLINICAL DATA:  Seizure. EXAM: PORTABLE CHEST 1 VIEW COMPARISON:  Radiograph 06/28/2019. Chest CT 10/14/2018. Prior imaging at [place]: Mild cardiomegaly is similar to prior exam. No acute airspace disease. No pulmonary edema, large pleural effusion or pneumothorax. Patient's chin partially obscures the apices. No acute osseous abnormalities are seen. IMPRESSION: Stable mild cardiomegaly. No acute abnormality. Electronically Signed   By: Keith Rake M.D.   On: 08/29/2019 06:14   EEG adult  Result Date: 08/29/2019 Lora Havens, MD     08/29/2019  4:38 PM Patient Name: Evan Jacobs MRN: [id number] Epilepsy Attending: Lora Havens Referring Physician/Provider: Dr Derrick Ravel Date: 08/29/2019  Duration: 25.38 mins Patient history: This 68 year old male with history of alcohol abuse along with alcohol withdrawal seizures who presented with seizure. EEG to evaluate for seizure Level of alertness: awake, drowsy AEDs during EEG study: LEV Technical aspects: This EEG study was done with scalp electrodes positioned according to the 10-20 International system of electrode placement. Electrical activity was acquired at a sampling rate of 500Hz  and reviewed with a high frequency filter of 70Hz  and a low frequency filter of 1Hz . EEG data were recorded continuously and digitally stored. DESCRIPTION: No clear posterior dominant rhythm was seen. EEG showed  continuous generalized 3-6hz  theta-delta slowing. Hyperventilation and photic stimulation were not performed. Of note, eeg was technically difficult secondary to significant myogenic and eye flutter artifact. ABNORMALITY - Continuous slow, generalized IMPRESSION: This technically difficult study is suggestive of moderate diffuse encephalopathy, non specific to etiology. No seizures or epileptiform discharges were seen throughout the recording. Priyanka Barbra Sarks    Scheduled Meds: . Chlorhexidine Gluconate Cloth  6 each Topical Daily  . enoxaparin (LOVENOX) injection  40 mg Subcutaneous Q24H  . folic acid  1 mg Oral Daily  . multivitamin with minerals  1 tablet Oral Daily   Continuous Infusions: . sodium chloride 75 mL/hr at 08/30/19 0600  . levETIRAcetam 1,000 mg (08/30/19 0935)  . thiamine injection 500 mg (08/30/19 1600)     LOS: 1 day   Time spent: 45 minutes.  I personally reviewed his chart.  Lorella Nimrod, MD [facility] Pager [phone]  If 7PM-7AM, please contact night-coverage [email or web address] Password [facility] 08/30/2019, 5:03 PM   This record has been created using Systems analyst. Errors have been sought and corrected,but may not always be located. Such creation errors do not reflect on the standard of care.

## 2019-08-30 NOTE — ED Notes (Signed)
Pt is resting comfortably.

## 2019-08-31 LAB — COMPREHENSIVE METABOLIC PANEL
ALT: 12 U/L (ref 0–44)
AST: 27 U/L (ref 15–41)
Albumin: 3.4 g/dL — ABNORMAL LOW (ref 3.5–5.0)
Alkaline Phosphatase: 33 U/L — ABNORMAL LOW (ref 38–126)
Anion gap: 10 (ref 5–15)
BUN: 5 mg/dL — ABNORMAL LOW (ref 8–23)
CO2: 26 mmol/L (ref 22–32)
Calcium: 9.2 mg/dL (ref 8.9–10.3)
Chloride: 103 mmol/L (ref 98–111)
Creatinine, Ser: 0.99 mg/dL (ref 0.61–1.24)
GFR calc Af Amer: >60 mL/min (ref >60)
GFR calc non Af Amer: >60 mL/min (ref >60)
Glucose, Bld: 102 mg/dL — ABNORMAL HIGH (ref 70–99)
Potassium: 3.4 mmol/L — ABNORMAL LOW (ref 3.5–5.1)
Sodium: 139 mmol/L (ref 135–145)
Total Bilirubin: 0.5 mg/dL (ref 0.3–1.2)
Total Protein: 5.8 g/dL — ABNORMAL LOW (ref 6.5–8.1)

## 2019-08-31 LAB — CBC
HCT: 32.5 % — ABNORMAL LOW (ref 39.0–52.0)
Hemoglobin: 11 g/dL — ABNORMAL LOW (ref 13.0–17.0)
MCH: 26.9 pg (ref 26.0–34.0)
MCHC: 33.8 g/dL (ref 30.0–36.0)
MCV: 79.5 fL — ABNORMAL LOW (ref 80.0–100.0)
Platelets: 149 10*3/uL — ABNORMAL LOW (ref 150–400)
RBC: 4.09 MIL/uL — ABNORMAL LOW (ref 4.22–5.81)
RDW: 17.6 % — ABNORMAL HIGH (ref 11.5–15.5)
WBC: 4 10*3/uL (ref 4.0–10.5)
nRBC: 0 % (ref 0.0–0.2)

## 2019-08-31 LAB — VITAMIN B1: Vitamin B1 (Thiamine): 70.3 nmol/L (ref 66.5–200.0)

## 2019-08-31 LAB — MAGNESIUM: Magnesium: 1.6 mg/dL — ABNORMAL LOW (ref 1.7–2.4)

## 2019-08-31 MED ORDER — FOLIC ACID 1 MG PO TABS: 1.0000 mg | ORAL_TABLET | Freq: Every day | ORAL | 2 refills | Status: AC

## 2019-08-31 MED ORDER — MAGNESIUM SULFATE 2 GM/50ML IV SOLN
2.0000 g | Freq: Once | INTRAVENOUS | Status: AC
  Administered 2019-08-31: 09:00:00 2 g via INTRAVENOUS
  Filled 2019-08-31: qty 50

## 2019-08-31 MED ORDER — LEVETIRACETAM 1000 MG PO TABS: 1000.0000 mg | ORAL_TABLET | Freq: Two times a day (BID) | ORAL | 2 refills | Status: AC

## 2019-08-31 MED ORDER — POTASSIUM CHLORIDE CRYS ER 20 MEQ PO TBCR
40.0000 meq | EXTENDED_RELEASE_TABLET | Freq: Once | ORAL | Status: AC
  Administered 2019-08-31: 09:00:00 40 meq via ORAL
  Filled 2019-08-31: qty 4

## 2019-08-31 NOTE — TOC Initial Note (Addendum)
Transition of Care [facility]) - Initial/Assessment Note    Patient Details  Name: Evan Jacobs MRN: [id number] Date of Birth: 1950/10/14  Transition of [facility]) CM/SW Contact:    Kermit Balo, RN Phone Number: 08/31/2019, 11:29 AM  Clinical Narrative:                 Pt lives alone but states his girlfriend could provide support and supervision at home.  CM provided him resource for outpatient, inpatient, on line counseling for his alcohol abuse.  Pt states girlfriend can provide any needed transportation.  DME to be delivered to the room. CM to arrange [facility] services.   1230 addendum: PCP: Dr Cresenciano Genre at [facility]. Pt chose [facility] services. Kandee Keen with [facility] accepted the referral.    Expected Discharge Plan: Home w Home Health Services Barriers to Discharge: Continued Medical Work up   Patient Goals and CMS Choice   CMS Medicare.gov Compare Post Acute Care list provided to:: Patient Choice offered to / list presented to : Patient  Expected Discharge Plan and Services Expected Discharge Plan: Home w Home Health Services   Discharge Planning Services: CM Consult Post Acute Care Choice: Home Health Living arrangements for the past 2 months: Single Family Home                 DME Arranged: Walker rolling DME Agency: [employer] Date DME Agency Contacted: 08/31/19   Representative spoke with at DME Agency: Zack            Prior Living Arrangements/Services Living arrangements for the past 2 months: Single Family Home Lives with:: Self Patient language and need for interpreter reviewed:: No Do you feel safe going back to the place where you live?: Yes      Need for Family Participation in Patient Care: Yes (Comment) Care giver support system in place?: Yes (comment)(pt states girilfriend can provide supervision) Current home services: DME(cane) Criminal Activity/Legal Involvement Pertinent to Current Situation/Hospitalization: No - Comment as  needed  Activities of Daily Living Home Assistive Devices/Equipment: None ADL Screening (condition at time of admission) Patient's cognitive ability adequate to safely complete daily activities?: No(patient very drowsy and not answering) Is the patient deaf or have difficulty hearing?: No Does the patient have difficulty seeing, even when wearing glasses/contacts?: No Does the patient have difficulty concentrating, remembering, or making decisions?: Yes Patient able to express need for assistance with ADLs?: No Does the patient have difficulty dressing or bathing?: Yes Independently performs ADLs?: No Communication: Dependent(non verbal at this time) Is this a change from baseline?: Change from baseline, expected to last >3 days Dressing (OT): Dependent Is this a change from baseline?: Change from baseline, expected to last >3 days Grooming: Dependent Is this a change from baseline?: Change from baseline, expected to last >3 days Feeding: Dependent Is this a change from baseline?: Change from baseline, expected to last >3 days Bathing: Dependent Is this a change from baseline?: Change from baseline, expected to last >3 days Toileting: Dependent Is this a change from baseline?: Change from baseline, expected to last >3days In/Out Bed: Dependent Is this a change from baseline?: Change from baseline, expected to last >3 days Walks in Home: Dependent Is this a change from baseline?: Change from baseline, expected to last >3 days Does the patient have difficulty walking or climbing stairs?: Yes(secondary to drowsiness and weakness) Weakness of Legs: Both Weakness of Arms/Hands: Both  Permission Sought/Granted  Emotional Assessment Appearance:: Appears stated age Attitude/Demeanor/Rapport: Engaged Affect (typically observed): Accepting Orientation: : Oriented to Self, Oriented to Place, Oriented to  Time, Oriented to Situation Alcohol / Substance Use: Alcohol  Use(resources provided for counseling) Psych Involvement: No (comment)  Admission diagnosis:  Hyponatremia [E87.1] Seizure ([place]) [R56.9] Seizures ([place]) [R56.9] Altered mental status, unspecified altered mental status type [R41.82] Patient Active Problem List   Diagnosis Date Noted  . Seizures ([place]) 08/29/2019  . Hyponatremia 08/29/2019  . Lactic acidosis 08/29/2019  . Hypokalemia 08/29/2019  . Hypomagnesemia 08/29/2019   PCP:  Patient, No Pcp Per Pharmacy:   [facility] [address] [place] [address] Phone: [phone] Fax: [phone]     Social Determinants of Health (SDOH) Interventions    Readmission Risk Interventions No flowsheet data found.

## 2019-08-31 NOTE — Progress Notes (Signed)
Pt given discharge paperwork and discharged home via friend as transportation.
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
## 2019-08-31 NOTE — Discharge Summary (Signed)
Physician Discharge Summary  Evan Jacobs XBM:[id number] DOB: 18-Nov-1950 DOA: 08/29/2019  PCP: Patient, No Pcp Per  Admit date: 08/29/2019 Discharge date: 08/31/2019  Admitted From: Home Disposition: Home  Recommendations for Outpatient Follow-up:  1. Follow up with PCP in 1-2 weeks 2. Please obtain BMP/CBC in one week 3. Please follow up on the following pending results:None  Home Health: Yes Equipment/Devices: Rolling walker Discharge Condition: Stable CODE STATUS: Full Diet recommendation: Heart Healthy   Brief/Interim Summary: Evan Jacobs a 68 y.o.malewith medical history significant ofintractable hiccups, hyponatremia, seizures, GERD with esophagitis, hypertension, alcohol abuse being brought to the ED via EMS for evaluation of a seizure.  Patient has an history of seizure disorder and taking Keppra 500 mg twice daily, according to patient he is taking his meds regularly.  He had a prolonged postictal state.  There was some concern about alcohol withdrawal seizures.  Patient mostly drinks on a regular basis and his ethanol level was undetectable on presentation. Neurology saw him and he was discharged on increased dose of Keppra to 1 g twice daily. We also arranged some home services for therapy and medication management.  Hyponatremia.  Patient with history of chronic hyponatremia and using salt pills at home.  His sodium was 124 on presentation and he was initially given 3% normal saline because of his seizure.  His sodium improved gradually with normal saline after that.  On discharge day it was 139. He will continue with his salt pills and follow-up with PCP.  He was also found to have some other electrolyte derangements which includes hypokalemia and hypomagnesemia which were repleted.  Hypertension.  Patient is on multiple meds at home which include a combination pill of amlodipine and valsartan along with diltiazem, metoprolol and losartan. Losartan was  discontinued and he was advised to continue with the combination pill.  We will continue with diltiazem and metoprolol and needs to follow-up with PCP for further management.  Discharge Diagnoses:  Principal Problem:   Seizures (HCC) Active Problems:   Hyponatremia   Lactic acidosis   Hypokalemia   Hypomagnesemia  Discharge Instructions  Discharge Instructions    Diet - low sodium heart healthy   Complete by: As directed    Discharge instructions   Complete by: As directed    It was pleasure taking care of you. We are increasing the dose of your seizure medicine called Keppra, now you will take 1 g twice daily. There was 2 medications with the same group for blood pressure.  You will continue with the combination pill and stop taking losartan. We are also arranging some home health services for you. Please follow-up with your PCP and neurologist.   Increase activity slowly   Complete by: As directed      Allergies as of 08/31/2019      Reactions   Lisinopril Other (See Comments)   Kidney Dysfunction.      Medication List    TAKE these medications   amLODipine-valsartan 10-160 MG tablet Commonly known as: EXFORGE Take 1 tablet by mouth daily.   ascorbic acid 500 MG tablet Commonly known as: VITAMIN C Take 500 mg by mouth daily.   chlorproMAZINE 25 MG tablet Commonly known as: THORAZINE Take 25 mg by mouth 3 (three) times daily.   Cholecalciferol 25 MCG (1000 UT) capsule Take 2,000 Units by mouth daily.   diltiazem 120 MG 24 hr capsule Commonly known as: TIAZAC Take 120 mg by mouth daily.   folic acid 1 MG tablet Commonly  known as: FOLVITE Take 1 tablet (1 mg total) by mouth daily. Start taking on: September 01, 2019   iron polysaccharides 150 MG capsule Commonly known as: NIFEREX Take 150 mg by mouth daily.   levETIRAcetam 1000 MG tablet Commonly known as: KEPPRA Take 1 tablet (1,000 mg total) by mouth 2 (two) times daily. What changed:   medication  strength  how much to take   metoprolol tartrate 50 MG tablet Commonly known as: LOPRESSOR Take 50 mg by mouth 2 (two) times daily.   omeprazole 40 MG capsule Commonly known as: PRILOSEC Take 40 mg by mouth 2 (two) times daily.   sodium chloride 1 g tablet Take 1 g by mouth 3 (three) times daily.   thiamine 100 MG tablet Take 100 mg by mouth daily.   vitamin B-12 500 MCG tablet Commonly known as: CYANOCOBALAMIN Take 500 mcg by mouth daily.            Durable Medical Equipment  (From admission, onward)         Start     Ordered   08/31/19 1135  DME Walker  Once    Question Answer Comment  Walker: With 5 Inch Wheels   Patient needs a walker to treat with the following condition Seizure disorder (HCC)      08/31/19 1137   08/31/19 1134  For home use only DME Walker rolling  Once    Question:  Patient needs a walker to treat with the following condition  Answer:  Seizures (HCC)   08/31/19 1134          Allergies  Allergen Reactions  . Lisinopril Other (See Comments)    Kidney Dysfunction.     Consultations:  Nephrology  Neurology  Procedures/Studies: DG Abdomen 1 View  Result Date: 08/29/2019 CLINICAL DATA:  Pre MRI. EXAM: ABDOMEN - 1 VIEW COMPARISON:  Radiograph 01/03/2019 FINDINGS: No radiopaque foreign body or metallic medical device projects over the abdomen to preclude MRI imaging. Scattered air throughout nondilated small bowel in the central abdomen. EKG lead projects over the lower chest. Pelvic calcifications likely phleboliths. Unremarkable osseous structures. IMPRESSION: No radiopaque foreign body projects over the abdomen to preclude MRI imaging. Electronically Signed   By: Narda Rutherford M.D.   On: 08/29/2019 06:59   CT Head Wo Contrast  Result Date: 08/29/2019 CLINICAL DATA:  Altered mental status. Seizure. EXAM: CT HEAD WITHOUT CONTRAST TECHNIQUE: Contiguous axial images were obtained from the base of the skull through the vertex  without intravenous contrast. COMPARISON:  CT 05/29/2019, multiple priors. Prior imaging obtained at [facility]. FINDINGS: Brain: No intracranial hemorrhage, mass effect, or midline shift. Stable degree of generalized atrophy. No hydrocephalus. The basilar cisterns are patent. Unchanged mild chronic small vessel ischemia from prior exam. Possible tiny remote lacunar infarct in the right caudate. No evidence of territorial infarct or acute ischemia. No extra-axial or intracranial fluid collection. Vascular: Atherosclerosis of skullbase vasculature without hyperdense vessel or abnormal calcification. Skull: No fracture or focal lesion. Sinuses/Orbits: Chronic left frontal sinusitis with chronic erosion of the anterior wall and diffuse cortical thickening. Findings are unchanged over multiple prior exams. Scattered mucosal thickening of ethmoid air cells. The mastoid air cells are clear. Leftward gaze, orbits are otherwise unremarkable. Other: None. IMPRESSION: 1. No acute intracranial abnormality. 2. Stable atrophy and chronic small vessel ischemia. 3. Chronic left frontal sinusitis, unchanged over multiple prior exams. Electronically Signed   By: Narda Rutherford M.D.   On: 08/29/2019 02:55   MR  BRAIN WO CONTRAST  Result Date: 08/29/2019 CLINICAL DATA:  Encephalopathy. EXAM: MRI HEAD WITHOUT CONTRAST TECHNIQUE: Multiplanar, multiecho pulse sequences of the brain and surrounding structures were obtained without intravenous contrast. COMPARISON:  Head CT performed earlier the same day 08/29/2019, brain MRI 05/16/2018 FINDINGS: Brain: There is no evidence of acute infarct. No evidence of intracranial mass. No midline shift or extra-axial fluid collection. No chronic intracranial blood products. Mild patchy and ill-defined T2/FLAIR hyperintensity within the cerebral white matter is nonspecific, but consistent with chronic small vessel ischemic disease. A small chronic lacunar infarct within the right frontal lobe  subcortical white matter is new from prior MRI (series 6, image 19). Mild generalized parenchymal atrophy. Vascular: Flow voids maintained within the proximal large arterial vessels. Skull and upper cervical spine: No focal marrow lesion. Incompletely assessed upper cervical spondylosis. Sinuses/Orbits: Visualized orbits demonstrate no acute abnormality. Mild scattered paranasal sinus mucosal thickening. No significant mastoid effusion. IMPRESSION: No evidence of acute intracranial abnormality. Mild generalized parenchymal atrophy and chronic small vessel ischemic disease. A small chronic lacunar infarct within the right frontal lobe subcortical white matter is new since MRI 05/16/2018. Electronically Signed   By: Jackey LogeKyle  Golden DO   On: 08/29/2019 08:35   DG CHEST PORT 1 VIEW  Result Date: 08/29/2019 CLINICAL DATA:  Seizure. EXAM: PORTABLE CHEST 1 VIEW COMPARISON:  Radiograph 06/28/2019. Chest CT 10/14/2018. Prior imaging at [facility] FINDINGS: Mild cardiomegaly is similar to prior exam. No acute airspace disease. No pulmonary edema, large pleural effusion or pneumothorax. Patient's chin partially obscures the apices. No acute osseous abnormalities are seen. IMPRESSION: Stable mild cardiomegaly. No acute abnormality. Electronically Signed   By: Narda RutherfordMelanie  Sanford M.D.   On: 08/29/2019 06:14   EEG adult  Result Date: 08/29/2019 Charlsie QuestYadav, Priyanka O, MD     08/29/2019  4:38 PM Patient Name: Corrie DandyLeverne Jacobs MRN: [id number] Epilepsy Attending: Charlsie QuestPriyanka O Yadav Referring Physician/Provider: Dr Eligha BridegroomVasundhara Rathore Date: 08/29/2019  Duration: 25.38 mins Patient history: This 68 year old male with history of alcohol abuse along with alcohol withdrawal seizures who presented with seizure. EEG to evaluate for seizure Level of alertness: awake, drowsy AEDs during EEG study: LEV Technical aspects: This EEG study was done with scalp electrodes positioned according to the 10-20 International system of electrode placement.  Electrical activity was acquired at a sampling rate of 500Hz  and reviewed with a high frequency filter of 70Hz  and a low frequency filter of 1Hz . EEG data were recorded continuously and digitally stored. DESCRIPTION: No clear posterior dominant rhythm was seen. EEG showed continuous generalized 3-6hz  theta-delta slowing. Hyperventilation and photic stimulation were not performed. Of note, eeg was technically difficult secondary to significant myogenic and eye flutter artifact. ABNORMALITY - Continuous slow, generalized IMPRESSION: This technically difficult study is suggestive of moderate diffuse encephalopathy, non specific to etiology. No seizures or epileptiform discharges were seen throughout the recording. Priyanka Annabelle Harman Yadav     Subjective: Patient was feeling better during morning rounds.  He wants to go back home. We discussed some home health care and he agreed to participate.  Discharge Exam: Vitals:   08/31/19 0347 08/31/19 0740  BP: (!) 166/70 (!) 147/68  Pulse: 73 70  Resp: 18 18  Temp: 98.7 F (37.1 C) 98.1 F (36.7 C)  SpO2: 99% 98%   Vitals:   08/30/19 2140 08/30/19 2256 08/31/19 0347 08/31/19 0740  BP: (!) 188/79 (!) 151/68 (!) 166/70 (!) 147/68  Pulse: 76 77 73 70  Resp: 18 17 18 18   Temp: Marland Kitchen(!)  97.4 F (36.3 C)  98.7 F (37.1 C) 98.1 F (36.7 C)  TempSrc: Oral  Oral Oral  SpO2: 100% 99% 99% 98%  Weight:      Height:        General: Pt is alert, awake, not in acute distress Cardiovascular: RRR, S1/S2 +, no rubs, no gallops Respiratory: CTA bilaterally, no wheezing, no rhonchi Abdominal: Soft, NT, ND, bowel sounds + Extremities: no edema, no cyanosis   The results of significant diagnostics from this hospitalization (including imaging, microbiology, ancillary and laboratory) are listed below for reference.    Microbiology: Recent Results (from the past 240 hour(s))  Urine culture     Status: None   Collection Time: 08/29/19  2:14 AM   Specimen: Urine, Clean  Catch  Result Value Ref Range Status   Specimen Description   Final    URINE, CLEAN CATCH Performed at [facility], [address] [place], [place] [address]    Special Requests   Final    NONE Performed at [facility], [address] [place], [place] [address]    Culture   Final    NO GROWTH Performed at [facility], [address], [place], [place] [address]    Report Status 08/30/2019 FINAL  Final  SARS CORONAVIRUS 2 (TAT 6-24 HRS) Nasopharyngeal Nasopharyngeal Swab     Status: None   Collection Time: 08/29/19  3:47 AM   Specimen: Nasopharyngeal Swab  Result Value Ref Range Status   SARS Coronavirus 2 NEGATIVE NEGATIVE Final    Comment: (NOTE) SARS-CoV-2 target nucleic acids are NOT DETECTED. The SARS-CoV-2 RNA is generally detectable in upper and lower respiratory specimens during the acute phase of infection. Negative results do not preclude SARS-CoV-2 infection, do not rule out co-infections with other pathogens, and should not be used as the sole basis for treatment or other patient management decisions. Negative results must be combined with clinical observations, patient history, and epidemiological information. The expected result is Negative. Fact Sheet for Patients: HairSlick.no Fact Sheet for Healthcare Providers: quierodirigir.com This test is not yet approved or cleared by the [place] FDA and  has been authorized for detection and/or diagnosis of SARS-CoV-2 by FDA under an Emergency Use Authorization (EUA). This EUA will remain  in effect (meaning this test can be used) for the duration of the COVID-19 declaration under Section 56 4(b)(1) of the Act, 21 U.S.C. section 360bbb-3(b)(1), unless the authorization is terminated or revoked sooner. Performed at [facility], [address] [place], [place] [address]      Labs: BNP (last 3  results) No results for input(s): BNP in the last 8760 hours. Basic Metabolic Panel: Recent Labs  Lab 08/29/19 0239 08/29/19 0515 08/29/19 1005 08/29/19 1151 08/29/19 1902 08/31/19 0253  NA  --  124* QUESTIONABLE RESULTS, RECOMMEND RECOLLECT TO VERIFY 127* 131* 139  K  --  2.8* QUESTIONABLE RESULTS, RECOMMEND RECOLLECT TO VERIFY 3.2* 3.6 3.4*  CL  --  85* QUESTIONABLE RESULTS, RECOMMEND RECOLLECT TO VERIFY 92* 98 103  CO2  --  25 QUESTIONABLE RESULTS, RECOMMEND RECOLLECT TO VERIFY 26 25 26   GLUCOSE  --  100* QUESTIONABLE RESULTS, RECOMMEND RECOLLECT TO VERIFY 92 96 102*  BUN  --  <5* QUESTIONABLE RESULTS, RECOMMEND RECOLLECT TO VERIFY <5* <5* 5*  CREATININE  --  0.82 QUESTIONABLE RESULTS, RECOMMEND RECOLLECT TO VERIFY 0.75 0.90 0.99  CALCIUM  --  8.3* QUESTIONABLE RESULTS, RECOMMEND RECOLLECT TO VERIFY 8.1* 8.3* 9.2  MG 1.2*  --  QUESTIONABLE RESULTS, RECOMMEND RECOLLECT TO VERIFY  --   --  1.6*  PHOS  --   --  QUESTIONABLE RESULTS, RECOMMEND RECOLLECT TO VERIFY  --   --   --    Liver Function Tests: Recent Labs  Lab 08/29/19 0214 08/29/19 1005 08/29/19 1151 08/31/19 0253  AST 50* QUESTIONABLE RESULTS, RECOMMEND RECOLLECT TO VERIFY 40 27  ALT 15 QUESTIONABLE RESULTS, RECOMMEND RECOLLECT TO VERIFY 12 12  ALKPHOS 45 QUESTIONABLE RESULTS, RECOMMEND RECOLLECT TO VERIFY 41 33*  BILITOT 1.3* QUESTIONABLE RESULTS, RECOMMEND RECOLLECT TO VERIFY 1.4* 0.5  PROT 7.5 QUESTIONABLE RESULTS, RECOMMEND RECOLLECT TO VERIFY 7.1 5.8*  ALBUMIN 4.6 QUESTIONABLE RESULTS, RECOMMEND RECOLLECT TO VERIFY 4.0 3.4*   No results for input(s): LIPASE, AMYLASE in the last 168 hours. Recent Labs  Lab 08/29/19 1902  AMMONIA 19   CBC: Recent Labs  Lab 08/29/19 0214 08/31/19 0253  WBC 4.8 4.0  NEUTROABS 2.7  --   HGB 13.5 11.0*  HCT 41.6 32.5*  MCV 78.6* 79.5*  PLT 197 149*   Cardiac Enzymes: No results for input(s): CKTOTAL, CKMB, CKMBINDEX, TROPONINI in the last 168 hours. BNP: Invalid  input(s): POCBNP CBG: No results for input(s): GLUCAP in the last 168 hours. D-Dimer No results for input(s): DDIMER in the last 72 hours. Hgb A1c No results for input(s): HGBA1C in the last 72 hours. Lipid Profile No results for input(s): CHOL, HDL, LDLCALC, TRIG, CHOLHDL, LDLDIRECT in the last 72 hours. Thyroid function studies No results for input(s): TSH, T4TOTAL, T3FREE, THYROIDAB in the last 72 hours.  Invalid input(s): FREET3 Anemia work up No results for input(s): VITAMINB12, FOLATE, FERRITIN, TIBC, IRON, RETICCTPCT in the last 72 hours. Urinalysis    Component Value Date/Time   COLORURINE YELLOW 08/29/2019 0214   APPEARANCEUR CLEAR 08/29/2019 0214   LABSPEC 1.009 08/29/2019 0214   PHURINE 6.0 08/29/2019 0214   GLUCOSEU NEGATIVE 08/29/2019 0214   HGBUR NEGATIVE 08/29/2019 0214   BILIRUBINUR NEGATIVE 08/29/2019 0214   KETONESUR NEGATIVE 08/29/2019 0214   PROTEINUR 100 (A) 08/29/2019 0214   NITRITE NEGATIVE 08/29/2019 0214   LEUKOCYTESUR NEGATIVE 08/29/2019 0214   Sepsis Labs Invalid input(s): PROCALCITONIN,  WBC,  LACTICIDVEN Microbiology Recent Results (from the past 240 hour(s))  Urine culture     Status: None   Collection Time: 08/29/19  2:14 AM   Specimen: Urine, Clean Catch  Result Value Ref Range Status   Specimen Description   Final    URINE, CLEAN CATCH Performed at [facility], [place] [address], [place], [place] [address]    Special Requests   Final    NONE Performed at [facility], [place] [address] [place], [place] [address]    Culture   Final    NO GROWTH Performed at [facility], [place] [address], [place], [place] [address]    Report Status 08/30/2019 FINAL  Final  SARS CORONAVIRUS 2 (TAT 6-24 HRS) Nasopharyngeal Nasopharyngeal Swab     Status: None   Collection Time: 08/29/19  3:47 AM   Specimen: Nasopharyngeal Swab  Result Value Ref Range Status   SARS Coronavirus 2 NEGATIVE NEGATIVE Final     Comment: (NOTE) SARS-CoV-2 target nucleic acids are NOT DETECTED. The SARS-CoV-2 RNA is generally detectable in upper and lower respiratory specimens during the acute phase of infection. Negative results do not preclude SARS-CoV-2 infection, do not rule out co-infections with other pathogens, and should not be used as the sole basis for  treatment or other patient management decisions. Negative results must be combined with clinical observations, patient history, and epidemiological information. The expected result is Negative. Fact Sheet for Patients: HairSlick.no Fact Sheet for Healthcare Providers: quierodirigir.com This test is not yet approved or cleared by the [place] FDA and  has been authorized for detection and/or diagnosis of SARS-CoV-2 by FDA under an Emergency Use Authorization (EUA). This EUA will remain  in effect (meaning this test can be used) for the duration of the COVID-19 declaration under Section 56 4(b)(1) of the Act, 21 U.S.C. section 360bbb-3(b)(1), unless the authorization is terminated or revoked sooner. Performed at [facility], [address], [place], [place] [address]     Time coordinating discharge: Over 30 minutes  SIGNED:  Arnetha Courser, MD  [facility] 08/31/2019, 11:38 AM Pager [phone]  If 7PM-7AM, please contact night-coverage [email or web address] Password [email or web address]  This record has been created using Conservation officer, historic buildings. Errors have been sought and corrected,but may not always be located. Such creation errors do not reflect on the standard of care.

## 2020-06-30 ENCOUNTER — Inpatient Hospital Stay (HOSPITAL_COMMUNITY): Payer: Medicare Other

## 2020-06-30 ENCOUNTER — Emergency Department (HOSPITAL_COMMUNITY): Payer: Medicare Other

## 2020-06-30 ENCOUNTER — Encounter (HOSPITAL_COMMUNITY): Payer: Self-pay | Admitting: Radiology

## 2020-06-30 ENCOUNTER — Other Ambulatory Visit: Payer: Self-pay

## 2020-06-30 ENCOUNTER — Inpatient Hospital Stay (HOSPITAL_COMMUNITY)
Admission: EM | Admit: 2020-06-30 | Discharge: 2020-07-06 | DRG: 643 | Disposition: A | Discharge status: Home / Self Care | Payer: Medicare Other | Attending: Internal Medicine | Admitting: Internal Medicine

## 2020-06-30 DIAGNOSIS — I160 Hypertensive urgency: Secondary | ICD-10-CM | POA: Diagnosis present

## 2020-06-30 DIAGNOSIS — Y900 Blood alcohol level of less than 20 mg/100 ml: Secondary | ICD-10-CM | POA: Diagnosis present

## 2020-06-30 DIAGNOSIS — E440 Moderate protein-calorie malnutrition: Secondary | ICD-10-CM | POA: Diagnosis present

## 2020-06-30 DIAGNOSIS — E876 Hypokalemia: Secondary | ICD-10-CM | POA: Diagnosis present

## 2020-06-30 DIAGNOSIS — E222 Syndrome of inappropriate secretion of antidiuretic hormone: Secondary | ICD-10-CM | POA: Diagnosis present

## 2020-06-30 DIAGNOSIS — J189 Pneumonia, unspecified organism: Secondary | ICD-10-CM

## 2020-06-30 DIAGNOSIS — Z79899 Other long term (current) drug therapy: Secondary | ICD-10-CM | POA: Diagnosis not present

## 2020-06-30 DIAGNOSIS — E871 Hypo-osmolality and hyponatremia: Secondary | ICD-10-CM | POA: Diagnosis not present

## 2020-06-30 DIAGNOSIS — K2920 Alcoholic gastritis without bleeding: Secondary | ICD-10-CM | POA: Diagnosis present

## 2020-06-30 DIAGNOSIS — R1110 Vomiting, unspecified: Secondary | ICD-10-CM | POA: Diagnosis not present

## 2020-06-30 DIAGNOSIS — G9340 Encephalopathy, unspecified: Secondary | ICD-10-CM | POA: Diagnosis not present

## 2020-06-30 DIAGNOSIS — F10239 Alcohol dependence with withdrawal, unspecified: Secondary | ICD-10-CM | POA: Diagnosis present

## 2020-06-30 DIAGNOSIS — G928 Other toxic encephalopathy: Secondary | ICD-10-CM | POA: Diagnosis present

## 2020-06-30 DIAGNOSIS — R29810 Facial weakness: Secondary | ICD-10-CM | POA: Diagnosis present

## 2020-06-30 DIAGNOSIS — R4182 Altered mental status, unspecified: Secondary | ICD-10-CM | POA: Diagnosis not present

## 2020-06-30 DIAGNOSIS — G40909 Epilepsy, unspecified, not intractable, without status epilepticus: Secondary | ICD-10-CM | POA: Diagnosis present

## 2020-06-30 DIAGNOSIS — I959 Hypotension, unspecified: Secondary | ICD-10-CM | POA: Diagnosis present

## 2020-06-30 DIAGNOSIS — R5381 Other malaise: Secondary | ICD-10-CM | POA: Diagnosis present

## 2020-06-30 DIAGNOSIS — J9600 Acute respiratory failure, unspecified whether with hypoxia or hypercapnia: Secondary | ICD-10-CM | POA: Diagnosis not present

## 2020-06-30 DIAGNOSIS — Z888 Allergy status to other drugs, medicaments and biological substances status: Secondary | ICD-10-CM

## 2020-06-30 DIAGNOSIS — G8194 Hemiplegia, unspecified affecting left nondominant side: Secondary | ICD-10-CM | POA: Diagnosis present

## 2020-06-30 DIAGNOSIS — G9341 Metabolic encephalopathy: Secondary | ICD-10-CM | POA: Diagnosis present

## 2020-06-30 DIAGNOSIS — Z20822 Contact with and (suspected) exposure to covid-19: Secondary | ICD-10-CM | POA: Diagnosis present

## 2020-06-30 DIAGNOSIS — J690 Pneumonitis due to inhalation of food and vomit: Secondary | ICD-10-CM | POA: Diagnosis not present

## 2020-06-30 DIAGNOSIS — E8809 Other disorders of plasma-protein metabolism, not elsewhere classified: Secondary | ICD-10-CM | POA: Diagnosis present

## 2020-06-30 DIAGNOSIS — Z6825 Body mass index (BMI) 25.0-25.9, adult: Secondary | ICD-10-CM | POA: Diagnosis not present

## 2020-06-30 DIAGNOSIS — J9601 Acute respiratory failure with hypoxia: Secondary | ICD-10-CM | POA: Diagnosis present

## 2020-06-30 DIAGNOSIS — I119 Hypertensive heart disease without heart failure: Secondary | ICD-10-CM | POA: Diagnosis present

## 2020-06-30 DIAGNOSIS — E778 Other disorders of glycoprotein metabolism: Secondary | ICD-10-CM | POA: Diagnosis present

## 2020-06-30 DIAGNOSIS — D539 Nutritional anemia, unspecified: Secondary | ICD-10-CM | POA: Diagnosis present

## 2020-06-30 DIAGNOSIS — E162 Hypoglycemia, unspecified: Secondary | ICD-10-CM | POA: Diagnosis present

## 2020-06-30 DIAGNOSIS — Z9114 Patient's other noncompliance with medication regimen: Secondary | ICD-10-CM

## 2020-06-30 LAB — I-STAT ARTERIAL BLOOD GAS, ED
Acid-Base Excess: 2 mmol/L (ref 0.0–2.0)
Bicarbonate: 27.2 mmol/L (ref 20.0–28.0)
Calcium, Ion: 1.06 mmol/L — ABNORMAL LOW (ref 1.15–1.40)
HCT: 41 % (ref 39.0–52.0)
Hemoglobin: 13.9 g/dL (ref 13.0–17.0)
O2 Saturation: 100 %
Potassium: 3.5 mmol/L (ref 3.5–5.1)
Sodium: 116 mmol/L — CL (ref 135–145)
TCO2: 29 mmol/L (ref 22–32)
pCO2 arterial: 45.1 mmHg (ref 32.0–48.0)
pH, Arterial: 7.388 (ref 7.350–7.450)
pO2, Arterial: 266 mmHg — ABNORMAL HIGH (ref 83.0–108.0)

## 2020-06-30 LAB — SODIUM
Sodium: 121 mmol/L — ABNORMAL LOW (ref 135–145)
Sodium: 122 mmol/L — ABNORMAL LOW (ref 135–145)

## 2020-06-30 LAB — I-STAT CHEM 8, ED
BUN: 4 mg/dL — ABNORMAL LOW (ref 8–23)
Calcium, Ion: 0.93 mmol/L — ABNORMAL LOW (ref 1.15–1.40)
Chloride: 79 mmol/L — ABNORMAL LOW (ref 98–111)
Creatinine, Ser: 0.9 mg/dL (ref 0.61–1.24)
Glucose, Bld: 139 mg/dL — ABNORMAL HIGH (ref 70–99)
HCT: 40 % (ref 39.0–52.0)
Hemoglobin: 13.6 g/dL (ref 13.0–17.0)
Potassium: 2.4 mmol/L — CL (ref 3.5–5.1)
Sodium: 115 mmol/L — CL (ref 135–145)
TCO2: 24 mmol/L (ref 22–32)

## 2020-06-30 LAB — CBC
HCT: 36 % — ABNORMAL LOW (ref 39.0–52.0)
HCT: 35.8 % — ABNORMAL LOW (ref 39.0–52.0)
Hemoglobin: 11.6 g/dL — ABNORMAL LOW (ref 13.0–17.0)
Hemoglobin: 12 g/dL — ABNORMAL LOW (ref 13.0–17.0)
MCH: 26.2 pg (ref 26.0–34.0)
MCH: 26 pg (ref 26.0–34.0)
MCHC: 32.2 g/dL (ref 30.0–36.0)
MCHC: 33.5 g/dL (ref 30.0–36.0)
MCV: 81.4 fL (ref 80.0–100.0)
MCV: 77.5 fL — ABNORMAL LOW (ref 80.0–100.0)
Platelets: 130 10*3/uL — ABNORMAL LOW (ref 150–400)
Platelets: 143 10*3/uL — ABNORMAL LOW (ref 150–400)
RBC: 4.42 MIL/uL (ref 4.22–5.81)
RBC: 4.62 MIL/uL (ref 4.22–5.81)
RDW: 14.7 % (ref 11.5–15.5)
RDW: 14.6 % (ref 11.5–15.5)
WBC: 8.1 10*3/uL (ref 4.0–10.5)
WBC: 10 10*3/uL (ref 4.0–10.5)
nRBC: 0 % (ref 0.0–0.2)
nRBC: 0 % (ref 0.0–0.2)

## 2020-06-30 LAB — BASIC METABOLIC PANEL
Anion gap: 16 — ABNORMAL HIGH (ref 5–15)
Anion gap: 11 (ref 5–15)
BUN: 6 mg/dL — ABNORMAL LOW (ref 8–23)
BUN: 6 mg/dL — ABNORMAL LOW (ref 8–23)
CO2: 20 mmol/L — ABNORMAL LOW (ref 22–32)
CO2: 28 mmol/L (ref 22–32)
Calcium: 8 mg/dL — ABNORMAL LOW (ref 8.9–10.3)
Calcium: 8.2 mg/dL — ABNORMAL LOW (ref 8.9–10.3)
Chloride: 77 mmol/L — ABNORMAL LOW (ref 98–111)
Chloride: 83 mmol/L — ABNORMAL LOW (ref 98–111)
Creatinine, Ser: 0.73 mg/dL (ref 0.61–1.24)
Creatinine, Ser: 0.78 mg/dL (ref 0.61–1.24)
GFR, Estimated: >60 mL/min (ref >60)
GFR, Estimated: >60 mL/min (ref >60)
Glucose, Bld: 106 mg/dL — ABNORMAL HIGH (ref 70–99)
Glucose, Bld: 96 mg/dL (ref 70–99)
Potassium: 3.6 mmol/L (ref 3.5–5.1)
Potassium: 3.2 mmol/L — ABNORMAL LOW (ref 3.5–5.1)
Sodium: 113 mmol/L — CL (ref 135–145)
Sodium: 122 mmol/L — ABNORMAL LOW (ref 135–145)

## 2020-06-30 LAB — DIFFERENTIAL
Abs Immature Granulocytes: 0.02 10*3/uL (ref 0.00–0.07)
Basophils Absolute: 0 10*3/uL (ref 0.0–0.1)
Basophils Relative: 0 %
Eosinophils Absolute: 0.1 10*3/uL (ref 0.0–0.5)
Eosinophils Relative: 1 %
Immature Granulocytes: 0 %
Lymphocytes Relative: 18 %
Lymphs Abs: 1.5 10*3/uL (ref 0.7–4.0)
Monocytes Absolute: 0.9 10*3/uL (ref 0.1–1.0)
Monocytes Relative: 12 %
Neutro Abs: 5.5 10*3/uL (ref 1.7–7.7)
Neutrophils Relative %: 69 %

## 2020-06-30 LAB — SODIUM, URINE, RANDOM: Sodium, Ur: 66 mmol/L

## 2020-06-30 LAB — RESPIRATORY PANEL BY RT PCR (FLU A&B, COVID)
Influenza A by PCR: NEGATIVE
Influenza B by PCR: NEGATIVE
SARS Coronavirus 2 by RT PCR: NEGATIVE

## 2020-06-30 LAB — COMPREHENSIVE METABOLIC PANEL
ALT: 12 U/L (ref 0–44)
AST: 29 U/L (ref 15–41)
Albumin: 4 g/dL (ref 3.5–5.0)
Alkaline Phosphatase: 64 U/L (ref 38–126)
Anion gap: 14 (ref 5–15)
BUN: <5 mg/dL — ABNORMAL LOW (ref 8–23)
CO2: 20 mmol/L — ABNORMAL LOW (ref 22–32)
Calcium: 7.9 mg/dL — ABNORMAL LOW (ref 8.9–10.3)
Chloride: 80 mmol/L — ABNORMAL LOW (ref 98–111)
Creatinine, Ser: 0.82 mg/dL (ref 0.61–1.24)
GFR, Estimated: >60 mL/min (ref >60)
Glucose, Bld: 137 mg/dL — ABNORMAL HIGH (ref 70–99)
Potassium: 2.5 mmol/L — CL (ref 3.5–5.1)
Sodium: 114 mmol/L — CL (ref 135–145)
Total Bilirubin: 1.2 mg/dL (ref 0.3–1.2)
Total Protein: 7.1 g/dL (ref 6.5–8.1)

## 2020-06-30 LAB — HEMOGLOBIN A1C
Hgb A1c MFr Bld: 5.2 % (ref 4.8–5.6)
Mean Plasma Glucose: 102.54 mg/dL

## 2020-06-30 LAB — PROTIME-INR
INR: 1.1 (ref 0.8–1.2)
Prothrombin Time: 13.4 seconds (ref 11.4–15.2)

## 2020-06-30 LAB — URINALYSIS, ROUTINE W REFLEX MICROSCOPIC
Bilirubin Urine: NEGATIVE
Glucose, UA: NEGATIVE mg/dL
Hgb urine dipstick: NEGATIVE
Ketones, ur: NEGATIVE mg/dL
Leukocytes,Ua: NEGATIVE
Nitrite: NEGATIVE
Protein, ur: NEGATIVE mg/dL
Specific Gravity, Urine: 1.021 (ref 1.005–1.030)
pH: 7 (ref 5.0–8.0)

## 2020-06-30 LAB — TROPONIN I (HIGH SENSITIVITY)
Troponin I (High Sensitivity): 31 ng/L — ABNORMAL HIGH (ref <18)
Troponin I (High Sensitivity): 59 ng/L — ABNORMAL HIGH (ref <18)

## 2020-06-30 LAB — RAPID URINE DRUG SCREEN, HOSP PERFORMED
Amphetamines: NOT DETECTED
Barbiturates: NOT DETECTED
Benzodiazepines: NOT DETECTED
Cocaine: NOT DETECTED
Opiates: NOT DETECTED
Tetrahydrocannabinol: NOT DETECTED

## 2020-06-30 LAB — CBG MONITORING, ED
Glucose-Capillary: 152 mg/dL — ABNORMAL HIGH (ref 70–99)
Glucose-Capillary: 93 mg/dL (ref 70–99)

## 2020-06-30 LAB — APTT: aPTT: 32 seconds (ref 24–36)

## 2020-06-30 LAB — GLUCOSE, CAPILLARY
Glucose-Capillary: 83 mg/dL (ref 70–99)
Glucose-Capillary: 126 mg/dL — ABNORMAL HIGH (ref 70–99)

## 2020-06-30 LAB — MAGNESIUM
Magnesium: 1.2 mg/dL — ABNORMAL LOW (ref 1.7–2.4)
Magnesium: 1.8 mg/dL (ref 1.7–2.4)

## 2020-06-30 LAB — MRSA PCR SCREENING: MRSA by PCR: NEGATIVE

## 2020-06-30 LAB — OSMOLALITY, URINE: Osmolality, Ur: 228 mOsm/kg — ABNORMAL LOW (ref 300–900)

## 2020-06-30 LAB — OSMOLALITY: Osmolality: 251 mOsm/kg — ABNORMAL LOW (ref 275–295)

## 2020-06-30 LAB — ETHANOL: Alcohol, Ethyl (B): <10 mg/dL (ref <10)

## 2020-06-30 MED ORDER — POTASSIUM CHLORIDE 10 MEQ/50ML IV SOLN
10.0000 meq | INTRAVENOUS | Status: AC
  Administered 2020-06-30 – 2020-07-01 (×4): 10 meq via INTRAVENOUS
  Filled 2020-06-30 (×4): qty 50

## 2020-06-30 MED ORDER — METOPROLOL TARTRATE 5 MG/5ML IV SOLN
5.0000 mg | Freq: Once | INTRAVENOUS | Status: AC
  Administered 2020-06-30: 5 mg via INTRAVENOUS
  Filled 2020-06-30: qty 5

## 2020-06-30 MED ORDER — FENTANYL CITRATE (PF) 100 MCG/2ML IJ SOLN
25.0000 ug | INTRAMUSCULAR | Status: DC | PRN
  Administered 2020-07-02: 100 ug via INTRAVENOUS
  Administered 2020-07-02: 50 ug via INTRAVENOUS
  Filled 2020-06-30 (×4): qty 2

## 2020-06-30 MED ORDER — ETOMIDATE 2 MG/ML IV SOLN
20.0000 mg | Freq: Once | INTRAVENOUS | Status: AC
  Administered 2020-06-30: 20 mg via INTRAVENOUS

## 2020-06-30 MED ORDER — THIAMINE HCL 100 MG/ML IJ SOLN
100.0000 mg | Freq: Once | INTRAMUSCULAR | Status: AC
  Administered 2020-06-30: 100 mg via INTRAVENOUS
  Filled 2020-06-30: qty 2

## 2020-06-30 MED ORDER — PANTOPRAZOLE SODIUM 40 MG IV SOLR
40.0000 mg | Freq: Two times a day (BID) | INTRAVENOUS | Status: DC
  Administered 2020-06-30 – 2020-07-02 (×4): 40 mg via INTRAVENOUS
  Filled 2020-06-30 (×4): qty 40

## 2020-06-30 MED ORDER — PROPOFOL 1000 MG/100ML IV EMUL
INTRAVENOUS | Status: AC
  Filled 2020-06-30: qty 100

## 2020-06-30 MED ORDER — CHLORHEXIDINE GLUCONATE CLOTH 2 % EX PADS
6.0000 | MEDICATED_PAD | Freq: Every day | CUTANEOUS | Status: DC
  Administered 2020-07-01 – 2020-07-06 (×6): 6 via TOPICAL

## 2020-06-30 MED ORDER — MAGNESIUM SULFATE 2 GM/50ML IV SOLN
2.0000 g | Freq: Once | INTRAVENOUS | Status: AC
  Administered 2020-06-30: 2 g via INTRAVENOUS
  Filled 2020-06-30: qty 50

## 2020-06-30 MED ORDER — LORAZEPAM 2 MG/ML IJ SOLN: 0.0000 mg | Freq: Two times a day (BID) | INTRAMUSCULAR | Status: DC

## 2020-06-30 MED ORDER — LEVETIRACETAM IN NACL 1000 MG/100ML IV SOLN
1000.0000 mg | Freq: Once | INTRAVENOUS | Status: AC
  Administered 2020-06-30: 1000 mg via INTRAVENOUS
  Filled 2020-06-30: qty 100

## 2020-06-30 MED ORDER — ORAL CARE MOUTH RINSE
15.0000 mL | OROMUCOSAL | Status: DC
  Administered 2020-07-01 – 2020-07-03 (×28): 15 mL via OROMUCOSAL

## 2020-06-30 MED ORDER — SODIUM CHLORIDE 0.9 % IV SOLN
3.0000 g | Freq: Four times a day (QID) | INTRAVENOUS | Status: DC
  Administered 2020-06-30 – 2020-07-02 (×8): 3 g via INTRAVENOUS
  Filled 2020-06-30: qty 8
  Filled 2020-06-30: qty 3
  Filled 2020-06-30: qty 8
  Filled 2020-06-30 (×4): qty 3
  Filled 2020-06-30: qty 8
  Filled 2020-06-30 (×2): qty 3

## 2020-06-30 MED ORDER — LORAZEPAM 1 MG PO TABS: 0.0000 mg | ORAL_TABLET | Freq: Two times a day (BID) | ORAL | Status: DC

## 2020-06-30 MED ORDER — LORAZEPAM 2 MG/ML IJ SOLN
2.0000 mg | Freq: Once | INTRAMUSCULAR | Status: AC
  Administered 2020-06-30: 2 mg via INTRAVENOUS

## 2020-06-30 MED ORDER — SODIUM CHLORIDE 0.9% FLUSH: 10.0000 mL | INTRAVENOUS | Status: DC | PRN

## 2020-06-30 MED ORDER — ETOMIDATE 2 MG/ML IV SOLN
INTRAVENOUS | Status: AC | PRN
  Administered 2020-06-30: 20 mg via INTRAVENOUS

## 2020-06-30 MED ORDER — INSULIN ASPART 100 UNIT/ML ~~LOC~~ SOLN
0.0000 [IU] | SUBCUTANEOUS | Status: DC
  Administered 2020-07-01: 1 [IU] via SUBCUTANEOUS

## 2020-06-30 MED ORDER — PROMETHAZINE HCL 25 MG/ML IJ SOLN
12.5000 mg | Freq: Once | INTRAMUSCULAR | Status: AC
  Administered 2020-06-30: 12.5 mg via INTRAVENOUS

## 2020-06-30 MED ORDER — ONDANSETRON HCL 4 MG/2ML IJ SOLN
INTRAMUSCULAR | Status: AC
  Filled 2020-06-30: qty 2

## 2020-06-30 MED ORDER — MAGNESIUM SULFATE 2 GM/50ML IV SOLN
INTRAVENOUS | Status: AC
  Filled 2020-06-30: qty 50

## 2020-06-30 MED ORDER — FENTANYL CITRATE (PF) 100 MCG/2ML IJ SOLN: 100.0000 ug | Freq: Once | INTRAMUSCULAR | Status: AC

## 2020-06-30 MED ORDER — POTASSIUM CHLORIDE 10 MEQ/100ML IV SOLN
10.0000 meq | Freq: Once | INTRAVENOUS | Status: AC
  Administered 2020-06-30: 10 meq via INTRAVENOUS
  Filled 2020-06-30: qty 100

## 2020-06-30 MED ORDER — LEVETIRACETAM IN NACL 500 MG/100ML IV SOLN
500.0000 mg | Freq: Two times a day (BID) | INTRAVENOUS | Status: DC
  Filled 2020-06-30: qty 100

## 2020-06-30 MED ORDER — METOPROLOL TARTRATE 5 MG/5ML IV SOLN
5.0000 mg | INTRAVENOUS | Status: DC | PRN
  Filled 2020-06-30: qty 5

## 2020-06-30 MED ORDER — DEXTROSE 5 % IV BOLUS
500.0000 mL | Freq: Once | INTRAVENOUS | Status: AC
  Administered 2020-06-30: 500 mL via INTRAVENOUS

## 2020-06-30 MED ORDER — LORAZEPAM 2 MG/ML IJ SOLN
0.0000 mg | Freq: Four times a day (QID) | INTRAMUSCULAR | Status: DC
  Filled 2020-06-30 (×2): qty 1

## 2020-06-30 MED ORDER — LEVETIRACETAM IN NACL 1000 MG/100ML IV SOLN
1000.0000 mg | Freq: Two times a day (BID) | INTRAVENOUS | Status: DC
  Administered 2020-07-01 – 2020-07-03 (×6): 1000 mg via INTRAVENOUS
  Filled 2020-06-30 (×6): qty 100

## 2020-06-30 MED ORDER — LORAZEPAM 2 MG/ML IJ SOLN
2.0000 mg | INTRAMUSCULAR | Status: AC
  Administered 2020-06-30: 2 mg via INTRAVENOUS
  Filled 2020-06-30: qty 1

## 2020-06-30 MED ORDER — POTASSIUM CHLORIDE CRYS ER 20 MEQ PO TBCR: 40.0000 meq | EXTENDED_RELEASE_TABLET | Freq: Once | ORAL | Status: DC

## 2020-06-30 MED ORDER — IOHEXOL 350 MG/ML SOLN
100.0000 mL | Freq: Once | INTRAVENOUS | Status: AC | PRN
  Administered 2020-06-30: 100 mL via INTRAVENOUS

## 2020-06-30 MED ORDER — FENTANYL CITRATE (PF) 100 MCG/2ML IJ SOLN
INTRAMUSCULAR | Status: AC
  Administered 2020-06-30: 100 ug
  Filled 2020-06-30: qty 2

## 2020-06-30 MED ORDER — POLYETHYLENE GLYCOL 3350 17 G PO PACK: 17.0000 g | PACK | Freq: Every day | ORAL | Status: DC | PRN

## 2020-06-30 MED ORDER — DOCUSATE SODIUM 100 MG PO CAPS: 100.0000 mg | ORAL_CAPSULE | Freq: Two times a day (BID) | ORAL | Status: DC | PRN

## 2020-06-30 MED ORDER — DOCUSATE SODIUM 50 MG/5ML PO LIQD
100.0000 mg | Freq: Two times a day (BID) | ORAL | Status: DC
  Administered 2020-06-30: 100 mg via ORAL
  Filled 2020-06-30 (×4): qty 10

## 2020-06-30 MED ORDER — PROPOFOL 1000 MG/100ML IV EMUL
5.0000 ug/kg/min | INTRAVENOUS | Status: DC
  Administered 2020-06-30: 30 ug/kg/min via INTRAVENOUS
  Administered 2020-06-30: 20 ug/kg/min via INTRAVENOUS
  Administered 2020-07-01: 5 ug/kg/min via INTRAVENOUS
  Filled 2020-06-30 (×2): qty 100

## 2020-06-30 MED ORDER — POTASSIUM CHLORIDE 20 MEQ/15ML (10%) PO SOLN
20.0000 meq | ORAL | Status: AC
  Administered 2020-06-30 – 2020-07-01 (×2): 20 meq
  Filled 2020-06-30 (×2): qty 15

## 2020-06-30 MED ORDER — LORAZEPAM 1 MG PO TABS: 0.0000 mg | ORAL_TABLET | Freq: Four times a day (QID) | ORAL | Status: DC

## 2020-06-30 MED ORDER — SODIUM CHLORIDE 0.9% FLUSH
10.0000 mL | Freq: Two times a day (BID) | INTRAVENOUS | Status: DC
  Administered 2020-06-30 – 2020-07-02 (×5): 10 mL
  Administered 2020-07-03 (×2): 20 mL
  Administered 2020-07-04 (×2): 10 mL

## 2020-06-30 MED ORDER — SODIUM CHLORIDE 3 % IV SOLN
INTRAVENOUS | Status: DC
  Filled 2020-06-30 (×2): qty 500

## 2020-06-30 MED ORDER — SUCCINYLCHOLINE CHLORIDE 20 MG/ML IJ SOLN
100.0000 mg | Freq: Once | INTRAMUSCULAR | Status: AC
  Administered 2020-06-30: 100 mg via INTRAVENOUS
  Filled 2020-06-30: qty 5

## 2020-06-30 MED ORDER — SUCCINYLCHOLINE CHLORIDE 20 MG/ML IJ SOLN
INTRAMUSCULAR | Status: AC | PRN
  Administered 2020-06-30: 100 mg via INTRAVENOUS

## 2020-06-30 MED ORDER — POTASSIUM CHLORIDE 10 MEQ/100ML IV SOLN
10.0000 meq | INTRAVENOUS | Status: AC
  Administered 2020-06-30 (×3): 10 meq via INTRAVENOUS
  Filled 2020-06-30 (×3): qty 100

## 2020-06-30 MED ORDER — POLYETHYLENE GLYCOL 3350 17 G PO PACK
17.0000 g | PACK | Freq: Every day | ORAL | Status: DC
  Filled 2020-06-30: qty 1

## 2020-06-30 MED ORDER — SODIUM CHLORIDE 0.9 % IV SOLN: INTRAVENOUS | Status: DC

## 2020-06-30 MED ORDER — CHLORHEXIDINE GLUCONATE 0.12% ORAL RINSE (MEDLINE KIT)
15.0000 mL | Freq: Two times a day (BID) | OROMUCOSAL | Status: DC
  Administered 2020-06-30 – 2020-07-04 (×8): 15 mL via OROMUCOSAL

## 2020-06-30 MED ORDER — FENTANYL CITRATE (PF) 100 MCG/2ML IJ SOLN
25.0000 ug | INTRAMUSCULAR | Status: DC | PRN
  Administered 2020-07-02: 25 ug via INTRAVENOUS

## 2020-06-30 MED ORDER — SODIUM CHLORIDE 0.9% FLUSH
3.0000 mL | Freq: Once | INTRAVENOUS | Status: AC
  Administered 2020-06-30: 3 mL via INTRAVENOUS

## 2020-06-30 NOTE — ED Notes (Signed)
Informed Dr. Criss Alvine of pt's Chem 8 result.

## 2020-06-30 NOTE — Progress Notes (Signed)
No definite response to ativan challenge. 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 I will continue his home keppra.   Ritta Slot, MD [facility] [phone]  If 7pm- 7am, please page neurology on call as listed in AMION.

## 2020-06-30 NOTE — Progress Notes (Signed)
STAT EEG complete - results pending. Neuro notified.  

## 2020-06-30 NOTE — ED Notes (Signed)
Report given to buddy rn on 4n

## 2020-06-30 NOTE — ED Notes (Signed)
Pt started vomiting. Pt was unable to protect airway. MD at bedside. Pharm & resp at bedside for intubation due to patient being unable to protect airway.

## 2020-06-30 NOTE — ED Provider Notes (Signed)
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  While he was hooked up to EEG he received Ativan for some shaking via neurology and then developed copious amounts of emesis.  He was not protecting his airway as he just sat there and started vomiting.  He was being suctioned and given antiemetics but still vomiting and decision made to intubate as he is not protecting his airway.  Central line was also placed as above and he will probably need hypertonic saline.  ICU was consulted for admission. Unfortunately there is concern patient may have aspirated during the initial emesis. Final Clinical Impression(s) / ED Diagnoses Final diagnoses:  Hyponatremia  Hypokalemia  Acute respiratory failure, unspecified whether with hypoxia or hypercapnia [facility])    Rx / DC Orders ED Discharge Orders    None       Pricilla Loveless, MD 06/30/20 1557
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
## 2020-06-30 NOTE — H&P (Signed)
Date: 06/30/2020               Patient Name:  Evan Jacobs MRN: [id number]  DOB: [birth year] Age / Sex: 69 y.o., male   PCP: Patient, No Pcp Per         Medical Service: Internal Medicine Teaching Service         Attending Physician: Dr. Pricilla Loveless, MD    First Contact: Dr. Merrilyn Puma, MD Pager: [phone]  Second Contact: Dr. Eliezer Bottom, MD Pager: [phone]       After Hours (After 5p/  First Contact Pager: [phone]  weekends / holidays): Second Contact Pager: [phone]   Chief Complaint: Altered mental status   History of Present Illness: Patient is a 69 year old male with history of chronic alcohol abuse, HTN, chronic hyponatremia (typically around high 120s - low 130s), seizure disorder, and chronic intractable hiccups who presented with altered mental status since waking up this morning.   He was nonverbal and did not follow any commands during my encounter. History was obtained by patient's girlfriend, [name], via telephone call. Patient woke up [name] around 0400 asking for ginger ale, drank some, and then went back to sleep. Around 0630, he got up out of bed, walked over to his [name] room, and was standing up holding on to a wall. He was breathing hard and began leaning over the chair. When told to sit down, he kept saying "I can't move." EMS called and brought patient in for suspected CODE STROKE.   As per [name], last time patient was normal was yesterday night after taking his medications around 2230-2300. Patient is verbally communicative and able to move all extremities normally at his baseline neurological status.  Patient does have seizures from time to time where he balls up his fist and does not move. Patient drinks alcohol regularly. As per [name], patient's last drink was last week unless if he has been drinking without her knowing.  From chart review, patient was admitted on 05/24/20 for acute metabolic encephalopathy 2/2 hyponatremia (Na 120) from  SIADH / beer potomania, and hypertensive urgency with SBP in 200s. Ultimately, he required transfer to ICU for 3% NS to normalize sodium. He also required IV labetalol, IV hydralazine, and his home anti-hypertensives for control of BP. Per chart review, he is noncompliant with his home medications.  ED Course: CMP showing sodium 114, potassium 2.5, glucose 137. Alcohol level <10. Negative urinalysis and UDS. COVID-19 negative. CT head negative for acute intracranial abnormalities. CT Angio head and neck showing no LVO or hemodynamically significant stenosis. CXR negative.    Meds:  No current facility-administered medications on file prior to encounter.   Current Outpatient Medications on File Prior to Encounter  Medication Sig Dispense Refill  . amLODipine-valsartan (EXFORGE) 10-160 MG tablet Take 1 tablet by mouth daily.    . chlorproMAZINE (THORAZINE) 25 MG tablet Take 25 mg by mouth 3 (three) times daily.    . Cholecalciferol 25 MCG (1000 UT) capsule Take 2,000 Units by mouth daily.    Marland Kitchen diltiazem (TIAZAC) 120 MG 24 hr capsule Take 120 mg by mouth daily.     . folic acid (FOLVITE) 1 MG tablet Take 1 tablet (1 mg total) by mouth daily. 30 tablet 2  . iron polysaccharides (NIFEREX) 150 MG capsule Take 150 mg by mouth daily.    Marland Kitchen levETIRAcetam (KEPPRA) 1000 MG tablet Take 1 tablet (1,000 mg total) by mouth 2 (two) times daily. 60 tablet 2  .  metoprolol tartrate (LOPRESSOR) 50 MG tablet Take 50 mg by mouth 2 (two) times daily.    Marland Kitchen omeprazole (PRILOSEC) 40 MG capsule Take 40 mg by mouth 2 (two) times daily.    . sodium chloride 1 g tablet Take 1 g by mouth 3 (three) times daily.    Marland Kitchen thiamine 100 MG tablet Take 100 mg by mouth daily.    . vitamin B-12 (CYANOCOBALAMIN) 500 MCG tablet Take 500 mcg by mouth daily.      No outpatient medications have been marked as taking for the 06/30/20 encounter [facility] Encounter).     Allergies: Allergies as of 06/30/2020 - Review Complete 06/30/2020    Allergen Reaction Noted  . Lisinopril Other (See Comments) 02/11/2016   Past Medical History:  Diagnosis Date  . Acid reflux   . Alcohol abuse   . Gout   . Hypertension   . Seizures (HCC)    Secondary to alcohol withdrawal    Family History:  History reviewed. No pertinent family history.  Social History:  Social History   Socioeconomic History  . Marital status: Single    Spouse name: Not on file  . Number of children: Not on file  . Years of education: Not on file  . Highest education level: Not on file  Occupational History  . Not on file  Tobacco Use  . Smoking status: Never Smoker  . Smokeless tobacco: Never Used  Substance and Sexual Activity  . Alcohol use: Yes    Comment: occ  . Drug use: No  . Sexual activity: Not on file  Other Topics Concern  . Not on file  Social History Narrative  . Not on file   Social Determinants of Health   Financial Resource Strain:   . Difficulty of Paying Living Expenses: Not on file  Food Insecurity:   . Worried About Programme researcher, broadcasting/film/video in the Last Year: Not on file  . Ran Out of Food in the Last Year: Not on file  Transportation Needs:   . Lack of Transportation (Medical): Not on file  . Lack of Transportation (Non-Medical): Not on file  Physical Activity:   . Days of Exercise per Week: Not on file  . Minutes of Exercise per Session: Not on file  Stress:   . Feeling of Stress : Not on file  Social Connections:   . Frequency of Communication with Friends and Family: Not on file  . Frequency of Social Gatherings with Friends and Family: Not on file  . Attends Religious Services: Not on file  . Active Member of Clubs or Organizations: Not on file  . Attends Banker Meetings: Not on file  . Marital Status: Not on file  Intimate Partner Violence:   . Fear of Current or Ex-Partner: Not on file  . Emotionally Abused: Not on file  . Physically Abused: Not on file  . Sexually Abused: Not on file      Review of Systems: A complete ROS was negative except as per HPI.    Physical Exam: Blood pressure (!) 213/89, pulse 95, temperature 97.8 F (36.6 C), temperature source Oral, resp. rate (!) 22, SpO2 98 %.  Physical Exam Constitutional:      Appearance: He is ill-appearing.     Comments: Elderly male, lying in bed, repeatedly belching but nonverbal, NAD.  HENT:     Head: Normocephalic and atraumatic.     Mouth/Throat:     Mouth: Mucous membranes are moist.  Pharynx: Oropharynx is clear.  Eyes:     Conjunctiva/sclera: Conjunctivae normal.     Pupils: Pupils are equal, round, and reactive to light.  Cardiovascular:     Rate and Rhythm: Normal rate and regular rhythm.     Pulses: Normal pulses.     Heart sounds: Normal heart sounds. No murmur heard.  No friction rub. No gallop.   Pulmonary:     Effort: Pulmonary effort is normal.     Breath sounds: Normal breath sounds. No wheezing, rhonchi or rales.  Abdominal:     General: Bowel sounds are normal. There is distension.     Palpations: Abdomen is soft.     Tenderness: There is no abdominal tenderness. There is no guarding or rebound.  Musculoskeletal:        General: No swelling.  Skin:    Capillary Refill: Capillary refill takes less than 2 seconds.     Comments: Upper extremities and RLE warm and dry. LLE cool to the touch. Pulses 2+ throughout.  Neurological:     Comments: Nonverbal and did not follow any commands. Was only able to move RLE. Unable to assess muscle strength or sensation.     CT Code Stroke CTA Head W/WO contrast  Result Date: 06/30/2020 CLINICAL DATA:  Left-sided weakness, code stroke follow-up EXAM: CT ANGIOGRAPHY HEAD AND NECK CT PERFUSION BRAIN TECHNIQUE: Multidetector CT imaging of the head and neck was performed using the standard protocol during bolus administration of intravenous contrast. Multiplanar CT image reconstructions and MIPs were obtained to evaluate the vascular anatomy.  Carotid stenosis measurements (when applicable) are obtained utilizing NASCET criteria, using the distal internal carotid diameter as the denominator. Multiphase CT imaging of the brain was performed following IV bolus contrast injection. Subsequent parametric perfusion maps were calculated using RAPID software. CONTRAST:  OMNIPAQUE IOHEXOL 350 MG/ML SOLN COMPARISON:  2019 FINDINGS: CTA NECK Aortic arch: Great vessel origins are patent. Right carotid system: Patent. Mild calcified plaque at the ICA origin without measurable stenosis. Left carotid system: Patent. Minimal calcified plaque at the ICA origin without measurable stenosis. Vertebral arteries: Patent. No measurable stenosis or evidence of dissection. Skeleton: Advanced degenerative changes of the cervical spine at C5-C6 and C6-C7. Chronic thoracic compression fractures. Other neck: No mass or adenopathy. Upper chest: Esophageal wall thickening and dilatation also seen previously. No apical lung mass. Trace right pleural effusion. Review of the MIP images confirms the above findings CTA HEAD Anterior circulation: Intracranial internal carotid arteries are patent with calcified plaque causing mild stenosis. Anterior and middle cerebral arteries are patent. Posterior circulation: Intracranial vertebral arteries are patent with calcified plaque on the right causing mild stenosis. Basilar artery is patent. 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 Posterior cerebral arteries are patent. Venous sinuses: Patent as allowed by contrast bolus timing. Review of the MIP images confirms the above findings CT Brain Perfusion Findings: CBF (<30%) Volume: 60mL Perfusion (Tmax>6.0s) volume: 59mL Mismatch Volume: 66mL Infarction Location: None. IMPRESSION: No large vessel occlusion or hemodynamically significant stenosis. Perfusion imaging demonstrates no evidence of core infarction or penumbra. Electronically Signed   By: Guadlupe Spanish M.D.   On: 06/30/2020 10:05   CT Code  Stroke CTA Neck W/WO contrast  Result Date: 06/30/2020 CLINICAL DATA:  Left-sided weakness, code stroke follow-up EXAM: CT ANGIOGRAPHY HEAD AND NECK CT PERFUSION BRAIN TECHNIQUE: Multidetector CT imaging of the head and neck was performed using the standard protocol during bolus administration of intravenous contrast. Multiplanar CT image reconstructions and MIPs were obtained  to evaluate the vascular anatomy. Carotid stenosis measurements (when applicable) are obtained utilizing NASCET criteria, using the distal internal carotid diameter as the denominator. Multiphase CT imaging of the brain was performed following IV bolus contrast injection. Subsequent parametric perfusion maps were calculated using RAPID software. CONTRAST:  100mL OMNIPAQUE IOHEXOL 350 MG/ML SOLN COMPARISON:  2019 FINDINGS: CTA NECK Aortic arch: Great vessel origins are patent. Right carotid system: Patent. Mild calcified plaque at the ICA origin without measurable stenosis. Left carotid system: Patent. Minimal calcified plaque at the ICA origin without measurable stenosis. Vertebral arteries: Patent. No measurable stenosis or evidence of dissection. Skeleton: Advanced degenerative changes of the cervical spine at C5-C6 and C6-C7. Chronic thoracic compression fractures. Other neck: No mass or adenopathy. Upper chest: Esophageal wall thickening and dilatation also seen previously. No apical lung mass. Trace right pleural effusion. Review of the MIP images confirms the above findings CTA HEAD Anterior circulation: Intracranial internal carotid arteries are patent with calcified plaque causing mild stenosis. Anterior and middle cerebral arteries are patent. Posterior circulation: Intracranial vertebral arteries are patent with calcified plaque on the right causing mild stenosis. Basilar artery is patent. 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 Posterior cerebral arteries are patent. Venous sinuses: Patent as allowed by contrast bolus timing. Review  of the MIP images confirms the above findings CT Brain Perfusion Findings: CBF (<30%) Volume: 0mL Perfusion (Tmax>6.0s) volume: 0mL Mismatch Volume: 0mL Infarction Location: None. IMPRESSION: No large vessel occlusion or hemodynamically significant stenosis. Perfusion imaging demonstrates no evidence of core infarction or penumbra. Electronically Signed   By: Guadlupe SpanishPraneil  Patel M.D.   On: 06/30/2020 10:05   CT Code Stroke Cerebral Perfusion with contrast  Result Date: 06/30/2020 CLINICAL DATA:  Left-sided weakness, code stroke follow-up EXAM: CT ANGIOGRAPHY HEAD AND NECK CT PERFUSION BRAIN TECHNIQUE: Multidetector CT imaging of the head and neck was performed using the standard protocol during bolus administration of intravenous contrast. Multiplanar CT image reconstructions and MIPs were obtained to evaluate the vascular anatomy. Carotid stenosis measurements (when applicable) are obtained utilizing NASCET criteria, using the distal internal carotid diameter as the denominator. Multiphase CT imaging of the brain was performed following IV bolus contrast injection. Subsequent parametric perfusion maps were calculated using RAPID software. CONTRAST:  100mL OMNIPAQUE IOHEXOL 350 MG/ML SOLN COMPARISON:  2019 FINDINGS: CTA NECK Aortic arch: Great vessel origins are patent. Right carotid system: Patent. Mild calcified plaque at the ICA origin without measurable stenosis. Left carotid system: Patent. Minimal calcified plaque at the ICA origin without measurable stenosis. Vertebral arteries: Patent. No measurable stenosis or evidence of dissection. Skeleton: Advanced degenerative changes of the cervical spine at C5-C6 and C6-C7. Chronic thoracic compression fractures. Other neck: No mass or adenopathy. Upper chest: Esophageal wall thickening and dilatation also seen previously. No apical lung mass. Trace right pleural effusion. Review of the MIP images confirms the above findings CTA HEAD Anterior circulation:  Intracranial internal carotid arteries are patent with calcified plaque causing mild stenosis. Anterior and middle cerebral arteries are patent. Posterior circulation: Intracranial vertebral arteries are patent with calcified plaque on the right causing mild stenosis. Basilar artery is patent. 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 Posterior cerebral arteries are patent. Venous sinuses: Patent as allowed by contrast bolus timing. Review of the MIP images confirms the above findings CT Brain Perfusion Findings: CBF (<30%) Volume: 0mL Perfusion (Tmax>6.0s) volume: 0mL Mismatch Volume: 0mL Infarction Location: None. IMPRESSION: No large vessel occlusion or hemodynamically significant stenosis. Perfusion imaging demonstrates no evidence of core infarction or penumbra. Electronically  Signed   By: Guadlupe Spanish M.D.   On: 06/30/2020 10:05   DG Chest Portable 1 View  Result Date: 06/30/2020 CLINICAL DATA:  Chest pain. EXAM: PORTABLE CHEST 1 VIEW COMPARISON:  June 09, 2020 FINDINGS: Stable cardiomegaly. The hila and mediastinum are unchanged. No pneumothorax. No nodules or masses. No focal infiltrates. IMPRESSION: No active disease. Electronically Signed   By: Gerome Sam III M.D   On: 06/30/2020 10:20   CT HEAD CODE STROKE WO CONTRAST  Result Date: 06/30/2020 CLINICAL DATA:  Code stroke.  Left-sided weakness EXAM: CT HEAD WITHOUT CONTRAST TECHNIQUE: Contiguous axial images were obtained from the base of the skull through the vertex without intravenous contrast. COMPARISON:  None. FINDINGS: Brain: No acute intracranial hemorrhage mass effect, or edema. No new loss of gray-white differentiation. Small chronic right frontal infarct. Ventricles are stable in size. No hydrocephalus. Patchy hypoattenuation in the supratentorial white matter is nonspecific but may reflect minor chronic microvascular ischemic changes. No extra-axial collection. Vascular: No hyperdense vessel. There is intracranial atherosclerotic  calcification at the skull base Skull: Unremarkable. 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 No acute orbital finding. Other: Mastoid air cells are clear. ASPECTS (Alberta Stroke Program Early CT Score) - Ganglionic level infarction (caudate, lentiform nuclei, internal capsule, insula, M1-M3 cortex): 7 - Supraganglionic infarction (M4-M6 cortex): 3 Total score (0-10 with 10 being normal): 10 IMPRESSION: There is no acute intracranial hemorrhage or evidence of acute infarction. ASPECT score is 10. Small chronic right frontal infarct. These results were communicated to Dr. Amada Jupiter at 9:27 am on 06/30/2020 by text page via the [facility] messaging system. Electronically Signed   By: Guadlupe Spanish M.D.   On: 06/30/2020 09:32    Assessment & Plan by Problem: Active Problems:   * No active hospital problems. *  Altered Mental Status Presenting with altered mental status along with left sided weakness. LKN around 2300 yesterday night. No TPA given as patient appears to be outside window and does not have a LVO as target for intervention. Patient does have history of seizure disorder, so will need to rule this out with EEG. His altered mental status could be from stroke vs seizures vs severe hyponatremia vs hypertensive urgency, or multifactorial from some combination of these. 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 - greatly appreciate neurology consult, see recommendations - STAT EEG - continue home Keppra  BID - f/u MRI brain - on 0.9% NS infusion at 75cc/hr for correction of severe hyponatremia with goal of increasing sodium by 6-72mmol/L within first 24 hours - CIWA w/ ativan, IV thiamine in setting of chronic alcohol abuse - given IV lopressor  for BP control - trend BMP, replete electrolytes as necessary  Chronic hyponatremia Sodium  typically stays in high 120s or low 130s, with prior admissions in 2020 due to low sodium. On admission, sodium 114. 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 Per chart review, patient takes salt pills at home but unsure if he is compliant with medications. - on 0.9% NS at 75 cc/hr - goal correction of 6-8 mmol/L in first 24 hours - random urine sodium 66 - f/u urine osm, urine creatinine - trend BMP  Hypokalemia On admission, K 2.5. - Repleting with 4 runs of IV KCl - trend BMP  Hypertensive Urgency SBPs in 210s since coming to the ED. Patient has a history of  noncompliance, so unsure if he has been regularly taking his home antihypertensives (diltiazem 120mg  daily, lopressor 50mg  BID). Per chart review, patient was also on amlodipine-valsartan 10-160mg  daily as of 08/2019, but unsure if this has since been discontinued. - given 1 dose of IV lopressor 5mg  - will wait to rule out embolic stroke prior to restarting home antihypertensives (may require permissive hypertension)   Dispo: Admit patient to Inpatient with expected length of stay greater than 2 midnights.  Signed: , MD 06/30/2020, 11:11 AM  Pager: [phone] After 5pm on weekdays and 1pm on weekends: On Call pager: [phone]

## 2020-06-30 NOTE — Procedures (Signed)
History: 69 yo M with encephalopathy  Sedation: None  Technique: This is a 21 channel routine scalp EEG performed at the bedside with bipolar and monopolar montages arranged in accordance to the international 10/20 system of electrode placement. One channel was dedicated to EKG recording.    Background: The background is markedly disorganized with high voltage irregular delta and theta range activity. There are periodic discharges with triphasic morphology with a frequency of 2 - 2.5 hz. 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 This pattern is sometimes sharply contoured.   Photic stimulation: Physiologic driving is not performed  EEG Abnormalities: 1) Generalized periodic discharges with triphasic morphology 2) Generalized high voltage slow activity.   Clinical Interpretation: This EEG is most consistent with a generalized encephalopathy.  The triphasic waves seen can rarely be epileptic in origin, and therefore there could be consideration to an Ativan trial to further characterize this pattern.  Ritta Slot, MD [facility] [phone]  If 7pm- 7am, please page neurology on call as listed in AMION.

## 2020-06-30 NOTE — ED Notes (Signed)
pts bp low  Propofol decreased to 

## 2020-06-30 NOTE — ED Triage Notes (Signed)
Pt BIB EMS from home code stroke was called prior to arrival. Pt arrived unable to follow commands. Pt also has left sided weakness with slurred speech. When pt was found by one family member at 0830 he was slumped over. Per one family member his LKN was 0000 , per another family member it was 0600. Neurologist calling family for clarity.

## 2020-06-30 NOTE — Progress Notes (Signed)
eLink Physician-Brief Progress Note Patient Name: Evan Jacobs DOB: 08/12/1951 MRN: [id number]   Date of Service  06/30/2020  HPI/Events of Note  Na 122 so 3% saline was stopped. Was 113 at 1200 today, representing a rise of 9 mEq.  eICU Interventions  Discontinued 3% saline. Give 500cc D5W to bring back down to ~118-119. Continue Q4H Na checks.     Intervention Category Intermediate Interventions: Electrolyte abnormality - evaluation and management  Evan Jacobs 06/30/2020, 10:09 PM

## 2020-06-30 NOTE — Progress Notes (Signed)
LTM EEG hooked up and running - no initial skin breakdown - push button tested - neuro notified.  

## 2020-06-30 NOTE — Consult Note (Signed)
Neurology Consultation Reason for Consult: Altered mental status Referring Physician: Criss Alvine,  CC: altered mental status  History is obtained from: Girlfriend  HPI: Evan Jacobs is a 69 y.o. male with a history of seizures on Keppra who presents with altered mental status that was present on awakening this morning.  His girlfriend states that he initially was acting, finding, but was able to walk around, when she tried to talk to him he would not speak to her.  Subsequently he became less responsive that was at that point that EMS was activated.    LKW: 6:30 PM 10/30 tpa given?: no, outside of window    ROS:  Unable to obtain due to altered mental status.   Past Medical History:  Diagnosis Date  . Acid reflux   . Alcohol abuse   . Gout   . Hypertension   . Seizures (HCC)    Secondary to alcohol withdrawal     Family history: Unable to obtain due to altered mental status.  Social History:  reports that he has never smoked. He has never used smokeless tobacco. He reports current alcohol use. He reports that he does not use drugs.   Exam: Current vital signs: Vitals:   06/30/20 1059 06/30/20 1100  BP:    Pulse: 95 95  Resp: (!) 28 (!) 22  Temp:    SpO2: 98% 98%    Vital signs in last 24 hours:     Physical Exam  Constitutional: Appears well-developed and well-nourished.  Psych: Affect appropriate to situation Eyes: No scleral injection HENT: No OP obstrucion MSK: He has some absent fingers on the left hand Cardiovascular: Normal rate and regular rhythm.  Respiratory: Effort normal, non-labored breathing GI: Soft.  No distension. There is no tenderness. Constant belching.  Skin: WDI  Neuro: Mental Status: Patient is awake, alert, when I call his name, he looks towards me and says "yes" but does not follow commands or tell me his name.  Cranial Nerves: II: He blinks to threat bilaterally pupils are equal, round, and reactive to light.   III,IV, VI: EOMI  without ptosis or diploplia.  V: Facial sensation is symmetric to temperature VII: Facial movement with left facial weakness Motor: He does not cooperate with formal testing, but appears to move the right side slightly more than the left.  He does not keep either leg off the bed, but does counter gravity with both the left and right arm times Sensory: He response to noxious stimulation in all four extremities Cerebellar: Does not perform   I have reviewed labs in epic and the results pertinent to this consultation are: Sodium 114  I have reviewed the images obtained: CTA/CTP-negative  Impression: 69 year old male with altered mental status.  He is not a TPA candidate due to being outside the window and he has no large vessel occlusion as a target for intervention.  Given his history of seizures, I do think that he needs a stat EEG to rule out ongoing status epilepticus.  Is also possible that his current mental state is due to severe hyponatremia  Recommendations: 1) stat EEG 2) continue Keppra 500 twice daily, though may need to adjust if his EEG reveals evidence of ongoing seizure 3) MRI brain 4) treatment of hyponatremia per internal medicine 5) neurology will continue to follow   Ritta Slot, MD [facility] [phone]  If 7pm- 7am, please page neurology on call as listed in AMION.

## 2020-06-30 NOTE — Progress Notes (Addendum)
[facility] ADULT ICU REPLACEMENT PROTOCOL   The patient does apply for the [facility] Adult ICU Electrolyte Replacment Protocol based on the criteria listed below:   1. Is GFR >/= 30 ml/min? Yes.    Patient's GFR today is >60 2. Is SCr </= 2? Yes.   Patient's SCr is 0.78 ml/kg/hr 3. Did SCr increase >/= 0.5 in 24 hours? No. 4. Abnormal electrolyte(s): k 3.2, mag 1.8 5. Ordered repletion with: protocol 6. If a panic level lab has been reported, has the CCM MD in charge been notified? No..   Physician:    Markus Daft A 06/30/2020 8:58 PM   Hypertonic saline on hold for increase in NA level to 122. Repeat Na level pending now

## 2020-07-01 DIAGNOSIS — R4182 Altered mental status, unspecified: Secondary | ICD-10-CM

## 2020-07-01 DIAGNOSIS — E871 Hypo-osmolality and hyponatremia: Secondary | ICD-10-CM | POA: Diagnosis not present

## 2020-07-01 DIAGNOSIS — E440 Moderate protein-calorie malnutrition: Secondary | ICD-10-CM | POA: Insufficient documentation

## 2020-07-01 LAB — GLUCOSE, CAPILLARY
Glucose-Capillary: 96 mg/dL (ref 70–99)
Glucose-Capillary: 53 mg/dL — ABNORMAL LOW (ref 70–99)
Glucose-Capillary: 78 mg/dL (ref 70–99)
Glucose-Capillary: 100 mg/dL — ABNORMAL HIGH (ref 70–99)
Glucose-Capillary: 85 mg/dL (ref 70–99)
Glucose-Capillary: 99 mg/dL (ref 70–99)
Glucose-Capillary: 111 mg/dL — ABNORMAL HIGH (ref 70–99)

## 2020-07-01 LAB — PHOSPHORUS
Phosphorus: 3.3 mg/dL (ref 2.5–4.6)
Phosphorus: 3.5 mg/dL (ref 2.5–4.6)

## 2020-07-01 LAB — BASIC METABOLIC PANEL
Anion gap: 10 (ref 5–15)
BUN: 6 mg/dL — ABNORMAL LOW (ref 8–23)
CO2: 24 mmol/L (ref 22–32)
Calcium: 7.9 mg/dL — ABNORMAL LOW (ref 8.9–10.3)
Chloride: 85 mmol/L — ABNORMAL LOW (ref 98–111)
Creatinine, Ser: 1.01 mg/dL (ref 0.61–1.24)
GFR, Estimated: >60 mL/min (ref >60)
Glucose, Bld: 98 mg/dL (ref 70–99)
Potassium: 3.7 mmol/L (ref 3.5–5.1)
Sodium: 119 mmol/L — CL (ref 135–145)

## 2020-07-01 LAB — CBC
HCT: 34.3 % — ABNORMAL LOW (ref 39.0–52.0)
HCT: 32.7 % — ABNORMAL LOW (ref 39.0–52.0)
Hemoglobin: 11.6 g/dL — ABNORMAL LOW (ref 13.0–17.0)
Hemoglobin: 10.7 g/dL — ABNORMAL LOW (ref 13.0–17.0)
MCH: 26.5 pg (ref 26.0–34.0)
MCH: 26.1 pg (ref 26.0–34.0)
MCHC: 33.8 g/dL (ref 30.0–36.0)
MCHC: 32.7 g/dL (ref 30.0–36.0)
MCV: 78.3 fL — ABNORMAL LOW (ref 80.0–100.0)
MCV: 79.8 fL — ABNORMAL LOW (ref 80.0–100.0)
Platelets: 151 10*3/uL (ref 150–400)
Platelets: 132 10*3/uL — ABNORMAL LOW (ref 150–400)
RBC: 4.38 MIL/uL (ref 4.22–5.81)
RBC: 4.1 MIL/uL — ABNORMAL LOW (ref 4.22–5.81)
RDW: 15 % (ref 11.5–15.5)
RDW: 15.6 % — ABNORMAL HIGH (ref 11.5–15.5)
WBC: 9.2 10*3/uL (ref 4.0–10.5)
WBC: 6.4 10*3/uL (ref 4.0–10.5)
nRBC: 0 % (ref 0.0–0.2)
nRBC: 0 % (ref 0.0–0.2)

## 2020-07-01 LAB — COMPREHENSIVE METABOLIC PANEL
ALT: 17 U/L (ref 0–44)
AST: 49 U/L — ABNORMAL HIGH (ref 15–41)
Albumin: 3.3 g/dL — ABNORMAL LOW (ref 3.5–5.0)
Alkaline Phosphatase: 58 U/L (ref 38–126)
Anion gap: 11 (ref 5–15)
BUN: 5 mg/dL — ABNORMAL LOW (ref 8–23)
CO2: 24 mmol/L (ref 22–32)
Calcium: 7.9 mg/dL — ABNORMAL LOW (ref 8.9–10.3)
Chloride: 90 mmol/L — ABNORMAL LOW (ref 98–111)
Creatinine, Ser: 1.06 mg/dL (ref 0.61–1.24)
GFR, Estimated: >60 mL/min (ref >60)
Glucose, Bld: 99 mg/dL (ref 70–99)
Potassium: 3.7 mmol/L (ref 3.5–5.1)
Sodium: 125 mmol/L — ABNORMAL LOW (ref 135–145)
Total Bilirubin: 1.1 mg/dL (ref 0.3–1.2)
Total Protein: 6.4 g/dL — ABNORMAL LOW (ref 6.5–8.1)

## 2020-07-01 LAB — MAGNESIUM
Magnesium: 2.6 mg/dL — ABNORMAL HIGH (ref 1.7–2.4)
Magnesium: 3.1 mg/dL — ABNORMAL HIGH (ref 1.7–2.4)
Magnesium: 2.1 mg/dL (ref 1.7–2.4)

## 2020-07-01 LAB — SODIUM
Sodium: 119 mmol/L — CL (ref 135–145)
Sodium: 123 mmol/L — ABNORMAL LOW (ref 135–145)
Sodium: 124 mmol/L — ABNORMAL LOW (ref 135–145)
Sodium: 125 mmol/L — ABNORMAL LOW (ref 135–145)
Sodium: 125 mmol/L — ABNORMAL LOW (ref 135–145)
Sodium: 126 mmol/L — ABNORMAL LOW (ref 135–145)

## 2020-07-01 MED ORDER — POTASSIUM CHLORIDE 20 MEQ PO PACK
40.0000 meq | PACK | Freq: Once | ORAL | Status: AC
  Administered 2020-07-01: 40 meq
  Filled 2020-07-01: qty 2

## 2020-07-01 MED ORDER — POLYETHYLENE GLYCOL 3350 17 G PO PACK
17.0000 g | PACK | Freq: Every day | ORAL | Status: DC
  Administered 2020-07-01 – 2020-07-02 (×2): 17 g
  Filled 2020-07-01: qty 1

## 2020-07-01 MED ORDER — VITAL 1.5 CAL PO LIQD
1000.0000 mL | ORAL | Status: DC
  Administered 2020-07-01 – 2020-07-02 (×2): 1000 mL
  Filled 2020-07-01 (×2): qty 1000

## 2020-07-01 MED ORDER — PROSOURCE TF PO LIQD
45.0000 mL | Freq: Two times a day (BID) | ORAL | Status: DC
  Administered 2020-07-01 – 2020-07-03 (×6): 45 mL
  Filled 2020-07-01 (×6): qty 45

## 2020-07-01 MED ORDER — VITAL HIGH PROTEIN PO LIQD
1000.0000 mL | ORAL | Status: DC
  Administered 2020-07-01: 1000 mL

## 2020-07-01 MED ORDER — DOCUSATE SODIUM 50 MG/5ML PO LIQD
100.0000 mg | Freq: Two times a day (BID) | ORAL | Status: DC
  Administered 2020-07-01 – 2020-07-02 (×3): 100 mg
  Filled 2020-07-01 (×4): qty 10

## 2020-07-01 MED ORDER — FOLIC ACID 1 MG PO TABS
1.0000 mg | ORAL_TABLET | Freq: Every day | ORAL | Status: DC
  Administered 2020-07-01 – 2020-07-03 (×3): 1 mg
  Filled 2020-07-01 (×3): qty 1

## 2020-07-01 MED ORDER — DEXMEDETOMIDINE HCL IN NACL 400 MCG/100ML IV SOLN
0.4000 ug/kg/h | INTRAVENOUS | Status: DC
  Administered 2020-07-01: 0.4 ug/kg/h via INTRAVENOUS
  Administered 2020-07-02: 0.9 ug/kg/h via INTRAVENOUS
  Administered 2020-07-02: 1 ug/kg/h via INTRAVENOUS
  Administered 2020-07-02 – 2020-07-03 (×2): 0.4 ug/kg/h via INTRAVENOUS
  Filled 2020-07-01 (×5): qty 100

## 2020-07-01 MED ORDER — ADULT MULTIVITAMIN LIQUID CH
15.0000 mL | Freq: Every day | ORAL | Status: DC
  Administered 2020-07-01 – 2020-07-03 (×3): 15 mL
  Filled 2020-07-01 (×4): qty 15

## 2020-07-01 MED ORDER — METOPROLOL TARTRATE 25 MG/10 ML ORAL SUSPENSION
50.0000 mg | Freq: Two times a day (BID) | ORAL | Status: DC
  Administered 2020-07-01: 50 mg
  Filled 2020-07-01 (×4): qty 20

## 2020-07-01 MED ORDER — DESMOPRESSIN ACETATE 4 MCG/ML IJ SOLN
2.0000 ug | Freq: Once | INTRAMUSCULAR | Status: AC
  Administered 2020-07-01: 2 ug via INTRAVENOUS
  Filled 2020-07-01: qty 1

## 2020-07-01 MED ORDER — DEXTROSE 50 % IV SOLN: 25.0000 g | INTRAVENOUS | Status: AC

## 2020-07-01 MED ORDER — THIAMINE HCL 100 MG PO TABS
100.0000 mg | ORAL_TABLET | Freq: Every day | ORAL | Status: DC
  Administered 2020-07-01 – 2020-07-03 (×3): 100 mg
  Filled 2020-07-01 (×3): qty 1

## 2020-07-01 NOTE — Progress Notes (Signed)
EEG maintanence complete. No skin breakdown noted on Fp1 A1 O1 P7

## 2020-07-01 NOTE — Progress Notes (Addendum)
Attempted to call daughter, Clide Cliff, for update.  No answer, went to voicemail.   Addendum 1630: daughter updated by phone, all questions answered.    Posey Boyer, ACNP [facility] 07/01/2020, 2:45 PM  See Amion for personal pager PCCM on call pager [phone]

## 2020-07-01 NOTE — Progress Notes (Signed)
Subjective: Evan Jacobs. No seizures noted. However, after propofol was stopped, patient was noted to be weaker on left side with left gaze deviation.  Blood glucose at that time was 53.    ROS: Unable to obtain due to poor mental status  Examination  Vital signs in last 24 hours: Temp:  [98.4 F (36.9 C)-99.2 F (37.3 C)] 98.9 F (37.2 C) (11/01 1200) Pulse Rate:  [69-95] 74 (11/01 1300) Resp:  [12-22] 14 (11/01 1300) BP: (96-197)/(53-101) 123/69 (11/01 1300) SpO2:  [95 %-100 %] 100 % (11/01 1300) FiO2 (%):  [40 %-50 %] 40 % (11/01 1240) Weight:  [69.7 kg] 69.7 kg (11/01 0500)  General: lying in bed, NAD CVS: pulse-normal rate and rhythm RS: breathing comfortably, intubated Extremities: normal, warm  Neuro: off sedation, opens eyes to repeated tactile stimuli, does not follow commands, PERRLA, no forced gaze deviation, withdraws to noxious stimuli in all 4 extremities  Basic Metabolic Panel: Recent Labs  Lab 06/30/20 0931 06/30/20 0931 06/30/20 0932 06/30/20 1207 06/30/20 1207 06/30/20 1415 06/30/20 1814 06/30/20 1834 06/30/20 1834 06/30/20 2056 07/01/20 0005 07/01/20 0500 07/01/20 0808 07/01/20 1155  NA 114*   < > 115* 113*   < > 116*   < > 122*   < > 122* 119* 119* 125* 124*  K 2.5*   < > 2.4* 3.6  --  3.5  --  3.2*  --   --   --  3.7 3.7  --   CL 80*   < > 79* 77*  --   --   --  83*  --   --   --  85* 90*  --   CO2 20*  --   --  20*  --   --   --  28  --   --   --  24 24  --   GLUCOSE 137*   < > 139* 106*  --   --   --  96  --   --   --  98 99  --   BUN <5*   < > 4* 6*  --   --   --  6*  --   --   --  6* 5*  --   CREATININE 0.82   < > 0.90 0.73  --   --   --  0.78  --   --   --  1.01 1.06  --   CALCIUM 7.9*   < >  --  8.0*   < >  --   --  8.2*  --   --   --  7.9* 7.9*  --   MG  --   --   --  1.2*  --   --   --  1.8  --   --   --  2.6* 3.1*  --   PHOS  --   --   --   --   --   --   --   --   --   --   --   --  3.3  --    < > = values in this interval not displayed.     CBC: Recent Labs  Lab 06/30/20 0931 06/30/20 0932 06/30/20 1415 06/30/20 1834 07/01/20 0500  WBC 8.1  --   --  10.0 9.2  NEUTROABS 5.5  --   --   --   --   HGB 11.6* 13.6 13.9 12.0* 11.6*  HCT 36.0* 40.0 41.0  35.8* 34.3*  MCV 81.4  --   --  77.5* 78.3*  PLT 130*  --   --  143* 151     Coagulation Studies: Recent Labs    06/30/20 0931  LABPROT 13.4  INR 1.1    Imaging CT head without contrast 06/30/2020: Small chronic right frontal lobe infarct.  No acute abnormality.   ASSESSMENT AND PLAN: 69 year old male with history of chronic right frontal lobe infarct, seizures on Keppra who presented with altered mental status and was noted to have sodium of 114.   Hyponatremia (improving) Epilepsy Chronic infarct Acute encephalopathy (metabolic) Hypermagnesemia Hypoproteinemia with hypoalbuminemia Transaminitis Transient left hemiparesis Alcohol use disorder -Patient's encephalopathy most likely secondary to hyponatremia in the setting of alcohol use.  LTM EEG so far did not show any acute abnormalities.  The transient episode of left gaze deviation as well as weakness this morning was likely secondary to hypoglycemia, effect of propofol weaning off slowly on the side affected with stroke  Recommendations -Does not need to be on propofol from neurology standpoint.  Okay to use Precedex/fentanyl for sedation if needed -We will discontinue LTM EEG tomorrow if continues to be seizure-free off propofol -We will obtain MRI brain without contrast to look for acute abnormalities after discontinuing LTM EEG -Continue Keppra 1000 mg twice daily -Continue correction of sodium slowly, avoid correcting more than 10 mEq/L  in  24 hours. -Management of rest of comorbidities per primary team   CRITICAL CARE Performed by: Charlsie Quest   Total critical care time: 40 minutes  Critical care time was exclusive of separately billable procedures and treating other  patients.  Critical care was necessary to treat or prevent imminent or life-threatening deterioration.  Critical care was time spent personally by me on the following activities: development of treatment plan with patient and/or surrogate as well as nursing, discussions with consultants, evaluation of patient's response to treatment, examination of patient, obtaining history from patient or surrogate, ordering and performing treatments and interventions, ordering and review of laboratory studies, ordering and review of radiographic studies, pulse oximetry and re-evaluation of patient's condition.   Lindie Spruce Epilepsy [facility] For questions after 5pm please refer to AMION to reach the Neurologist on call

## 2020-07-01 NOTE — Progress Notes (Addendum)
Initial Nutrition Assessment  DOCUMENTATION CODES:   Non-severe (moderate) malnutrition in context of chronic illness  INTERVENTION:   Tube Feeding via OG: Change to Vital 1.5 at 50 ml/hr Pro-Source 45 mL BID Provides 1880 kcals, 103 g of protein and 912 mL of free water Meets 100% of estimated calorie and protein needs   NUTRITION DIAGNOSIS:   Moderate Malnutrition related to chronic illness as evidenced by mild muscle depletion, mild fat depletion, moderate fat depletion.   GOAL:   Patient will meet greater than or equal to 90% of their needs  MONITOR:   TF tolerance, Vent status, Labs, Weight trends  REASON FOR ASSESSMENT:   Consult, Ventilator Enteral/tube feeding initiation and management  ASSESSMENT:   69 yo male admitted with acute respiratory failure, acute encephalopathy with hyponatremia requiring intubation. PMH includes chronic EtOH abuse, seizures, chronic hyponatremia (120-130)   Patient is currently intubated on ventilator support MV: 9.1 L/min Temp (24hrs), Avg:98.8 F (37.1 C), Min:98.4 F (36.9 C), Max:99.2 F (37.3 C)  Vital High Protein at 20 ml/hr with Pro-Source 45 mL BID via OG  Hypoglycemic this AM, should improving with TF  Current wt 69.7 kg  Hyponatremic but chronic issue  Labs: sodium 124 (L), CBGs 53-100 Meds: folic acid, MVI, miralax, thiamine  NUTRITION - FOCUSED PHYSICAL EXAM:    Most Recent Value  Orbital Region Mild depletion  Upper Arm Region Moderate depletion  Thoracic and Lumbar Region Moderate depletion  Buccal Region Unable to assess  Temple Region Unable to assess  Clavicle Bone Region Mild depletion  Clavicle and Acromion Bone Region Mild depletion  Scapular Bone Region Mild depletion  Dorsal Hand Unable to assess  Patellar Region Mild depletion  Anterior Thigh Region Mild depletion  Posterior Calf Region Mild depletion       Diet Order:   Diet Order            Diet NPO time specified  Diet  effective now                 EDUCATION NEEDS:   Not appropriate for education at this time  Skin:  Skin Assessment: Reviewed RN Assessment  Last BM:  no documented BM  Height:   Ht Readings from Last 1 Encounters:  06/30/20 5\' 6"  (1.676 m)    Weight:   Wt Readings from Last 1 Encounters:  07/01/20 69.7 kg    BMI:  Body mass index is 24.8 kg/m.  Estimated Nutritional Needs:   Kcal:  1750-2070 kcals  Protein:  85-105 g  Fluid:  >/= 1.8 L   13/01/21 MS, RDN, LDN, CNSC Registered Dietitian III Clinical Nutrition RD Pager and On-Call Pager Number Located in [place]

## 2020-07-01 NOTE — Progress Notes (Signed)
NAME:  Evan Jacobs, MRN:  [id number], DOB:  12-Feb-1951, LOS: 1 ADMISSION DATE:  06/30/2020, CONSULTATION DATE:  10/31 REFERRING MD:  10/31, CHIEF COMPLAINT:  Acute respiratory failure and acute metabolic encephalopathy   Brief History   69 year old male w/ sig h/o heavy ETOH abuse and chronic hyponatremia (120s and felt w/ h/o beer potomania). Was to be admitted w/working dx of hypertensive crisis, symptomatic hyponatremia (114) and new left sided weakness and acute encephalopathy w/ concern for seizure. Shortly after being treated w/ ativan for his seizure had large volume emesis. Was intubated for airway protection.   Past Medical History  Chronic ETOH abuse, HTN, chronic hyponatremia (120-130), seizure disorder, chronic intractable hiccups.   Significant Hospital Events   10/31: Evaluated, and to be admitted.  Urine osmolality 228, urine sodium 66, initial sodium 114, systolic blood pressure 200s was to the to be admitted for further evaluation and treatment of multifocal metabolic encephalopathy felt likely secondary to hyponatremia, hypertension, possible seizure, and also concern for new stroke.  Was started on normal saline resuscitation, while in ER had large vomiting episode of coffee-ground appearing emesis he was not protecting his airway and therefore was intubated subsequent film concerning for possible aspiration  Consults:  Pulmonary/critical care Neurology  Procedures:  Endotracheal tube 10/31 >> Right IJ triple-lumen catheter 10/31 >>  Significant Diagnostic Tests:  UA 10/31:spec grave 1.021, uosmo: 228, Una 66.   10/31 CTH >> There is no acute intracranial hemorrhage or evidence of acute infarction. ASPECT score is 10.  Small chronic right frontal infarct.  10/31 EEG >>  1) Generalized periodic discharges with triphasic morphology 2) Generalized high voltage slow activity.  Clinical Interpretation: This EEG is most consistent with a generalized encephalopathy.  The  triphasic waves seen can rarely be epileptic in origin, and therefore there could be consideration to an Ativan trial to further characterize this pattern.  (f/u: no definite response to ativan challenge by neurology)  Micro Data:  10/31 SARS 2/ Flu >> neg 10/31 MRSA PCR >> neg  Antimicrobials:  Unasyn 10/31 >>  Interim history/subjective:  Afebrile  3% stopped overnight, Na 122 s/p D5W bolus, Na 119 Hypoglycemic episode this morning Propofol gtt off at 7am  Remains on cEEG, no clinical evidence of seizures  Objective   Blood pressure (!) 149/76, pulse 94, temperature 98.6 F (37 C), temperature source Axillary, resp. rate 19, height 5\' 6"  (1.676 m), weight 69.7 kg, SpO2 100 %.    Vent Mode: PRVC FiO2 (%):  [40 %-100 %] 40 % Set Rate:  [18 bmp] 18 bmp Vt Set:  [510 mL] 510 mL PEEP:  [5 cmH20] 5 cmH20 Plateau Pressure:  [15 cmH20-17 cmH20] 16 cmH20   Intake/Output Summary (Last 24 hours) at 07/01/2020 0730 Last data filed at 07/01/2020 0700 Gross per 24 hour  Intake 3151.38 ml  Output 3950 ml  Net -798.62 ml   Filed Weights   06/30/20 1300 07/01/20 0500  Weight: 70 kg 69.7 kg   Examination: General:  Critically ill elderly male intubated on MV in NAD HEENT: MM pink/moist, pupils 3/reactive, left pupil with left upper gaze, ETT/ OGT Neuro: attempts to open eyes (left open more than right) to touch, moves RLE to command, RUE some gross movement limited to gravity, flicker in LUE/ LLE CV: NSR, rr, no murmur PULM:  Non labored on MV, coarse on right, otherwise clear, failed SBT this am- low MV GI: soft, bs active, foley Extremities: warm/dry, no LE edema  Skin: no rashes   Resolved Hospital Problem list    Assessment & Plan:  Acute metabolic encephalopathy.  Suspect this is multifactorial.  Primary contributing factors as follows: Hyponatremia, hypertension, possible seizure, and rule out stroke. 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  DDAVP 2 mcg now and trend Na q 2 hours   Acute hypoxic respiratory failure secondary to metabolic encephalopathy and aspiration after emesis episode P:  Full MV support, PRVC VAP / PPI bundle Daily PSV trials, currently mental status precludes any extubation trials  Trend CXR/ ABG Continue Unasyn for   Severe hypokalemia, hypomagnesemia  P:  Mag 2.6 K 3.7-> will replete with additional KCL 40 meq Trend daily BMET/ mag  Coffee ground appearing emesis. Suspect probably has element of gastritis from ETOH. At risk for ileus 2/2 hypokalemia P:  K goal > 4 PPI BID Serial CBCs- stable H/H  Macrocytic anemia  P:  Trend CBC  Hypoglycemia  P:  D/c SSI Start TF per RD recs  Hx HTN P:  Restart metoprolol, consider restarting home diltiazem if BP remains stable   Chronic hiccups P:  Holding home horazine  Best practice:  Diet: NPO; Starting TF Pain/Anxiety/Delirium protocol (if indicated): propofol/ prn fentanyl  VAP protocol (if indicated): yes DVT prophylaxis: SCDs for now given coffee ground emesis; consider adding VTE if H/H stable  GI prophylaxis: PPI BID  Glucose control: trend CBC Mobility: BR Code Status: full code  Family Communication: pending  Disposition: ICU   Labs   CBC: Recent Labs  Lab 06/30/20 0931  06/30/20 0932 06/30/20 1415 06/30/20 1834 07/01/20 0500  WBC 8.1  --   --  10.0 9.2  NEUTROABS 5.5  --   --   --   --   HGB 11.6* 13.6 13.9 12.0* 11.6*  HCT 36.0* 40.0 41.0 35.8* 34.3*  MCV 81.4  --   --  77.5* 78.3*  PLT 130*  --   --  143* 151    Basic Metabolic Panel: Recent Labs  Lab 06/30/20 0931 06/30/20 0931 06/30/20 0932 06/30/20 0932 06/30/20 1207 06/30/20 1207 06/30/20 1415 06/30/20 1415 06/30/20 1814 06/30/20 1834 06/30/20 2056 07/01/20 0005 07/01/20 0500  NA 114*   < > 115*   < > 113*   < > 116*   < > 121* 122* 122* 119* 119*  K 2.5*   < > 2.4*  --  3.6  --  3.5  --   --  3.2*  --   --  3.7  CL 80*  --  79*  --  77*  --   --   --   --  83*  --   --  85*  CO2 20*  --   --   --  20*  --   --   --   --  28  --   --  24  GLUCOSE 137*  --  139*  --  106*  --   --   --   --  96  --   --  98  BUN <5*  --  4*  --  6*  --   --   --   --  6*  --   --  6*  CREATININE 0.82  --  0.90  --  0.73  --   --   --   --  0.78  --   --  1.01  CALCIUM 7.9*  --   --   --  8.0*  --   --   --   --  8.2*  --   --  7.9*  MG  --   --   --   --  1.2*  --   --   --   --  1.8  --   --  2.6*   < > = values in this interval not displayed.   GFR: Estimated Creatinine Clearance: 60.5 mL/min (by C-G formula based on SCr of 1.01 mg/dL). Recent Labs  Lab 06/30/20 0931 06/30/20 1834 07/01/20 0500  WBC 8.1 10.0 9.2    Liver Function Tests: Recent Labs  Lab 06/30/20 0931  AST 29  ALT 12  ALKPHOS 64  BILITOT 1.2  PROT 7.1  ALBUMIN 4.0   No results for input(s): LIPASE, AMYLASE in the last 168 hours. No results for input(s): AMMONIA in the last 168 hours.  ABG    Component Value Date/Time   PHART 7.388 06/30/2020 1415   PCO2ART 45.1 06/30/2020 1415   PO2ART 266 (H) 06/30/2020 1415   HCO3 27.2 06/30/2020 1415   TCO2 29 06/30/2020 1415   O2SAT 100.0 06/30/2020 1415     Coagulation Profile: Recent Labs  Lab 06/30/20 0931  INR 1.1    Cardiac Enzymes: No results for  input(s): CKTOTAL, CKMB, CKMBINDEX, TROPONINI in the last 168 hours.  HbA1C: Hgb A1c MFr Bld  Date/Time Value Ref Range Status  06/30/2020 06:34 PM 5.2 4.8 - 5.6 % Final    Comment:    (NOTE) Pre diabetes:          5.7%-6.4%  Diabetes:              >6.4%  Glycemic control for   <7.0% adults with diabetes     CBG: Recent Labs  Lab 06/30/20 0911 06/30/20 1633 06/30/20 1922 06/30/20 2312 07/01/20 0316  GLUCAP 152* 93 83 126* 96     Critical care time: 45 min     Posey Boyer, ACNP [facility] 07/01/2020, 7:30 AM  See Loretha Stapler for personal pager PCCM on call pager [phone]

## 2020-07-01 NOTE — Procedures (Signed)
Patient Name: Evan Jacobs  MRN: [id number]  Epilepsy Attending: Charlsie Quest  Referring Physician/Provider: Dr. Onalee Hua Duration: 06/30/2020 1242 to 11/1/20211242  Patient history: 69 year old male with right frontal stroke and history of seizures who presented with breakthrough seizure in the setting of sodium of 114.  EEG to evaluate for seizures.  Level of alertness: Comatose  AEDs during EEG study: Propofol, Keppra  Technical aspects: This EEG study was done with scalp electrodes positioned according to the 10-20 International system of electrode placement. Electrical activity was acquired at a sampling rate of 500Hz  and reviewed with a high frequency filter of 70Hz  and a low frequency filter of 1Hz . EEG data were recorded continuously and digitally stored.   Description: No posterior dominant rhythm was seen.  EEG showed continuous generalized polymorphic 2 to 3 Hz delta slowing admixed with 8 to 9 Hz of activity.  Hyperventilation and photic stimulation were not performed.     ABNORMALITY -Continuous slow, generalized  IMPRESSION: This study is suggestive of severe diffuse encephalopathy, nonspecific etiology but likely related to sedation.  No seizures or epileptiform discharges were seen throughout the recording.   [name] 

## 2020-07-02 ENCOUNTER — Inpatient Hospital Stay (HOSPITAL_COMMUNITY): Payer: Medicare Other

## 2020-07-02 DIAGNOSIS — E871 Hypo-osmolality and hyponatremia: Secondary | ICD-10-CM | POA: Diagnosis not present

## 2020-07-02 LAB — TSH: TSH: 1.517 u[IU]/mL (ref 0.350–4.500)

## 2020-07-02 LAB — CBC
HCT: 28.8 % — ABNORMAL LOW (ref 39.0–52.0)
Hemoglobin: 9.7 g/dL — ABNORMAL LOW (ref 13.0–17.0)
MCH: 27 pg (ref 26.0–34.0)
MCHC: 33.7 g/dL (ref 30.0–36.0)
MCV: 80.2 fL (ref 80.0–100.0)
Platelets: 121 10*3/uL — ABNORMAL LOW (ref 150–400)
RBC: 3.59 MIL/uL — ABNORMAL LOW (ref 4.22–5.81)
RDW: 15.8 % — ABNORMAL HIGH (ref 11.5–15.5)
WBC: 6.4 10*3/uL (ref 4.0–10.5)
nRBC: 0 % (ref 0.0–0.2)

## 2020-07-02 LAB — SODIUM
Sodium: 124 mmol/L — ABNORMAL LOW (ref 135–145)
Sodium: 124 mmol/L — ABNORMAL LOW (ref 135–145)
Sodium: 125 mmol/L — ABNORMAL LOW (ref 135–145)
Sodium: 126 mmol/L — ABNORMAL LOW (ref 135–145)
Sodium: 126 mmol/L — ABNORMAL LOW (ref 135–145)
Sodium: 127 mmol/L — ABNORMAL LOW (ref 135–145)
Sodium: 127 mmol/L — ABNORMAL LOW (ref 135–145)
Sodium: 128 mmol/L — ABNORMAL LOW (ref 135–145)
Sodium: 133 mmol/L — ABNORMAL LOW (ref 135–145)

## 2020-07-02 LAB — GLUCOSE, CAPILLARY
Glucose-Capillary: 157 mg/dL — ABNORMAL HIGH (ref 70–99)
Glucose-Capillary: 161 mg/dL — ABNORMAL HIGH (ref 70–99)
Glucose-Capillary: 138 mg/dL — ABNORMAL HIGH (ref 70–99)
Glucose-Capillary: 131 mg/dL — ABNORMAL HIGH (ref 70–99)
Glucose-Capillary: 95 mg/dL (ref 70–99)
Glucose-Capillary: 129 mg/dL — ABNORMAL HIGH (ref 70–99)

## 2020-07-02 LAB — PHOSPHORUS
Phosphorus: 3.6 mg/dL (ref 2.5–4.6)
Phosphorus: 3.8 mg/dL (ref 2.5–4.6)

## 2020-07-02 LAB — CREATININE, URINE, 24 HOUR
Collection Interval-UCRE24: 24 hours
Creatinine, 24H Ur: 1224 mg/d (ref 800–2000)
Creatinine, Urine: 174.88 mg/dL
Urine Total Volume-UCRE24: 700 mL

## 2020-07-02 LAB — POTASSIUM: Potassium: 3 mmol/L — ABNORMAL LOW (ref 3.5–5.1)

## 2020-07-02 LAB — MAGNESIUM
Magnesium: 2.2 mg/dL (ref 1.7–2.4)
Magnesium: 2 mg/dL (ref 1.7–2.4)

## 2020-07-02 MED ORDER — LORAZEPAM 2 MG/ML IJ SOLN
2.0000 mg | Freq: Once | INTRAMUSCULAR | Status: AC | PRN
  Administered 2020-07-02: 2 mg via INTRAVENOUS
  Filled 2020-07-02: qty 1

## 2020-07-02 MED ORDER — DILTIAZEM HCL ER BEADS 120 MG PO CP24
120.0000 mg | ORAL_CAPSULE | Freq: Every day | ORAL | Status: DC
  Filled 2020-07-02 (×2): qty 1

## 2020-07-02 MED ORDER — ENOXAPARIN SODIUM 40 MG/0.4ML ~~LOC~~ SOLN
40.0000 mg | SUBCUTANEOUS | Status: DC
  Administered 2020-07-02 – 2020-07-06 (×5): 40 mg via SUBCUTANEOUS
  Filled 2020-07-02 (×5): qty 0.4

## 2020-07-02 MED ORDER — PANTOPRAZOLE SODIUM 40 MG IV SOLR: 40.0000 mg | INTRAVENOUS | Status: DC

## 2020-07-02 MED ORDER — DILTIAZEM 12 MG/ML ORAL SUSPENSION
30.0000 mg | Freq: Four times a day (QID) | ORAL | Status: DC
  Administered 2020-07-02 – 2020-07-03 (×3): 30 mg
  Filled 2020-07-02 (×4): qty 3

## 2020-07-02 MED ORDER — LORAZEPAM 2 MG/ML IJ SOLN
2.0000 mg | INTRAMUSCULAR | Status: DC | PRN
  Administered 2020-07-03: 2 mg via INTRAVENOUS
  Filled 2020-07-02: qty 1

## 2020-07-02 NOTE — Procedures (Addendum)
Patient Name: Evan Jacobs  MRN: [id number]  Epilepsy Attending: Charlsie Quest  Referring Physician/Provider: Dr. Onalee Hua Duration: 07/01/2020 1242 to 07/02/2020 1130  Patient history: 69 year old male with right frontal stroke and history of seizures who presented with breakthrough seizure in the setting of sodium of 114.  EEG to evaluate for seizures.  Level of alertness: awake/lethargic, asleep  AEDs during EEG study:  Keppra  Technical aspects: This EEG study was done with scalp electrodes positioned according to the 10-20 International system of electrode placement. Electrical activity was acquired at a sampling rate of 500Hz  and reviewed with a high frequency filter of 70Hz  and a low frequency filter of 1Hz . EEG data were recorded continuously and digitally stored.   Description: No posterior dominant rhythm was seen. Sleep was characterized by vertex waves, sleep channels (12 to 14 Hz), EEG showed continuous generalized 3-6Hz  theta-delta slowing as well as intermittent generalized rhythmic 2 to 3 Hz delta slowing. Hyperventilation and photic stimulation were not performed.     ABNORMALITY -Continuous slow, generalized -Intermittent rhythmic delta slow, generalized  IMPRESSION: This study is suggestive of moderate to severe diffuse encephalopathy, nonspecific etiology but likely related to sedation.  No seizures or epileptiform discharges were seen throughout the recording.  EEG appears to be improving compared to previous day.  Trevious Rampey 

## 2020-07-02 NOTE — Progress Notes (Signed)
Gave 50 mcg Fentanyl in MRI today, but threw away vial before scanning. Witnessed by Kyra Searles, RN.

## 2020-07-02 NOTE — Progress Notes (Addendum)
Subjective: No clinical seizures.  Patient was waking up, following commands per RN but was agitated and trying to remove tube.  Therefore received Ativan and started on Precedex again.  ROS: Unable to obtain due to poor mental status  Examination  Vital signs in last 24 hours: Temp:  [97.5 F (36.4 C)-98.3 F (36.8 C)] 97.5 F (36.4 C) (11/02 1246) Pulse Rate:  [53-74] 54 (11/02 1400) Resp:  [5-20] 16 (11/02 1400) BP: (87-178)/(53-75) 178/65 (11/02 1400) SpO2:  [97 %-100 %] 100 % (11/02 1400) FiO2 (%):  [30 %-40 %] 30 % (11/02 1400) Weight:  [69.4 kg] 69.4 kg (11/02 0500)  General: lying in bed, NAD CVS: pulse-normal rate and rhythm RS: breathing comfortably, intubated Extremities: normal, warm Neuro:  On Precedex, winces to noxious stimuli, does not open eyes, does not follow commands, withdraws to noxious stimuli in all 4 extremities.  Basic Metabolic Panel: Recent Labs  Lab 06/30/20 0931 06/30/20 0931 06/30/20 0932 06/30/20 1207 06/30/20 1207 06/30/20 1415 06/30/20 1814 06/30/20 1834 06/30/20 2056 07/01/20 0500 07/01/20 0500 07/01/20 0808 07/01/20 1155 07/01/20 1700 07/01/20 1830 07/02/20 0306 07/02/20 0458 07/02/20 0645 07/02/20 0851 07/02/20 1115  NA 114*   < > 115* 113*   < > 116*   < > 122*   < > 119*   < > 125*   < > 125*   < > 124* 125* 126* 127* 127*  K 2.5*   < > 2.4* 3.6   < > 3.5  --  3.2*  --  3.7  --  3.7  --   --   --   --   --   --  3.0*  --   CL 80*   < > 79* 77*  --   --   --  83*  --  85*  --  90*  --   --   --   --   --   --   --   --   CO2 20*  --   --  20*  --   --   --  28  --  24  --  24  --   --   --   --   --   --   --   --   GLUCOSE 137*   < > 139* 106*  --   --   --  96  --  98  --  99  --   --   --   --   --   --   --   --   BUN <5*   < > 4* 6*  --   --   --  6*  --  6*  --  5*  --   --   --   --   --   --   --   --   CREATININE 0.82   < > 0.90 0.73  --   --   --  0.78  --  1.01  --  1.06  --   --   --   --   --   --   --   --    CALCIUM 7.9*   < >  --  8.0*   < >  --   --  8.2*  --  7.9*  --  7.9*  --   --   --   --   --   --   --   --  MG  --   --   --  1.2*   < >  --   --  1.8  --  2.6*  --  3.1*  --  2.1  --   --  2.2  --   --   --   PHOS  --   --   --   --   --   --   --   --   --   --   --  3.3  --  3.5  --   --  3.6  --   --   --    < > = values in this interval not displayed.    CBC: Recent Labs  Lab 06/30/20 0931 06/30/20 0932 06/30/20 1415 06/30/20 1834 07/01/20 0500 07/01/20 1700 07/02/20 0458  WBC 8.1  --   --  10.0 9.2 6.4 6.4  NEUTROABS 5.5  --   --   --   --   --   --   HGB 11.6*   < > 13.9 12.0* 11.6* 10.7* 9.7*  HCT 36.0*   < > 41.0 35.8* 34.3* 32.7* 28.8*  MCV 81.4  --   --  77.5* 78.3* 79.8* 80.2  PLT 130*  --   --  143* 151 132* 121*   < > = values in this interval not displayed.     Coagulation Studies: Recent Labs    06/30/20 0931  LABPROT 13.4  INR 1.1    Imaging MRI brain ordered and pending  ASSESSMENT AND PLAN: 69 year old male with history of chronic right frontal lobe infarct, seizures on Keppra who presented with altered mental status and was noted to have sodium of 114.   Hyponatremia (improving) Epilepsy Chronic infarct Acute encephalopathy (metabolic) Alcohol use disorder -Patient's encephalopathy most likely secondary to hyponatremia in the setting of alcohol use.   Recommendations -Discontinue LTM EEG as patient has not had any seizures. -We will obtain MRI brain without contrast to look for acute abnormalities  -Continue Keppra 1000 mg twice daily -Continue correction of sodium slowly, avoid correcting more than 10 mEq/L  in  24 hours. -Management of rest of comorbidities per primary team -Discussed my plan with bedside RN   ADDENDED - MRI Brain: No acute abnormality  Thank you for allowing [place] to contribute in the care of this patient.  Neurology will follow peripherally.  Please call if you have any further questions.  I have spent a total  of 35  minutes with the patient reviewing hospital notes,  test results, labs and examining the patient as well as establishing an assessment and plan.  > 50% of time was spent in direct patient care.  Lindie Spruce Epilepsy [facility] For questions after 5pm please refer to AMION to reach the Neurologist on call

## 2020-07-02 NOTE — Progress Notes (Signed)
Patient transported to MRI and back without complications. RN at bedside. 

## 2020-07-02 NOTE — Progress Notes (Signed)
LTM discontinued; no skin breakdown was seen. [facility] notified of D/C. 

## 2020-07-02 NOTE — Progress Notes (Addendum)
NAME:  Evan Jacobs, MRN:  [id number], DOB:  06/26/1951, LOS: 2 ADMISSION DATE:  06/30/2020, CONSULTATION DATE:  10/31 REFERRING MD:  10/31, CHIEF COMPLAINT:  Acute respiratory failure and acute metabolic encephalopathy   Brief History   69 year old male w/ sig h/o heavy ETOH abuse and chronic hyponatremia (120s and felt w/ h/o beer potomania). Was to be admitted w/working dx of hypertensive crisis, symptomatic hyponatremia (114) and new left sided weakness and acute encephalopathy w/ concern for seizure. Shortly after being treated w/ ativan for his seizure had large volume emesis. Was intubated for airway protection.   Past Medical History  Chronic ETOH abuse, HTN, chronic hyponatremia (120-130), seizure disorder, chronic intractable hiccups.   Significant Hospital Events   10/31: Evaluated, and to be admitted.  Urine osmolality 228, urine sodium 66, initial sodium 114, systolic blood pressure 200s was to the to be admitted for further evaluation and treatment of multifocal metabolic encephalopathy felt likely secondary to hyponatremia, hypertension, possible seizure, and also concern for new stroke.  Was started on normal saline resuscitation, while in ER had large vomiting episode of coffee-ground appearing emesis he was not protecting his airway and therefore was intubated subsequent film concerning for possible aspiration  Consults:  Pulmonary/critical care Neurology  Procedures:  Endotracheal tube 10/31 >> Right IJ triple-lumen catheter 10/31 >>  Significant Diagnostic Tests:  UA 10/31:spec grave 1.021, uosmo: 228, Una 66.   10/31 CTH >> There is no acute intracranial hemorrhage or evidence of acute infarction. ASPECT score is 10.  Small chronic right frontal infarct.  10/31 EEG >>  1) Generalized periodic discharges with triphasic morphology 2) Generalized high voltage slow activity.  Clinical Interpretation: This EEG is most consistent with a generalized encephalopathy.  The  triphasic waves seen can rarely be epileptic in origin, and therefore there could be consideration to an Ativan trial to further characterize this pattern.  (f/u: no definite response to ativan challenge by neurology)  Micro Data:  10/31 SARS 2/ Flu >> neg 10/31 MRSA PCR >> neg  Antimicrobials:  Unasyn 10/31-11/1  Interim history/subjective:  No overnight events, Na stabilized 124-126 after 3% stopped and dose of DDABP yesterday No further seizure events  Objective   Blood pressure (!) 156/64, pulse (!) 56, temperature 97.7 F (36.5 C), temperature source Axillary, resp. rate 18, height 5\' 6"  (1.676 m), weight 69.4 kg, SpO2 100 %.    Vent Mode: PRVC FiO2 (%):  [30 %-40 %] 30 % Set Rate:  [18 bmp] 18 bmp Vt Set:  [510 mL] 510 mL PEEP:  [5 cmH20] 5 cmH20 Plateau Pressure:  [13 cmH20-15 cmH20] 15 cmH20   Intake/Output Summary (Last 24 hours) at 07/02/2020 0734 Last data filed at 07/02/2020 0700 Gross per 24 hour  Intake 1618.7 ml  Output 1745 ml  Net -126.3 ml   Filed Weights   06/30/20 1300 07/01/20 0500 07/02/20 0500  Weight: 70 kg 69.7 kg 69.4 kg   General:  Elderly M, intubated and in no distress HEENT: MM pink/moist. ETT in place Neuro: arousable on Precedex, was following commands, but reaching for ETT, more somnolent after Precedex increased, pupils 39mm and reactive to light bilaterally,  CV: s1s2 rrr, no m/r/g PULM:  CTAB, on full vent support GI/GU: soft, bsx4 active, foley in place Extremities: warm/dry, no edema  Skin: no rashes or lesions   Resolved Hospital Problem list    Assessment & Plan:    Acute metabolic encephalopathy.  Suspect this is multifactorial.  Primary  contributing factors as follows: Hyponatremia, hypertension, possible seizure, and rule out stroke. - UDS neg - ETOH neg on admit P:  Arousable and following commands this AM on precedex Appreciate Neurology input, plan to continue continuous EEG today off propofol MRI Continue Serial  neuro exams  On Keppra  Seizure precautions  Trending Na, 126 this AM, up 7012mmol/L in 48hrs, slightly over-corrected yesterday and given DDAVP.  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  - reportedly non complaint at home with fluid restriction/ salt tablets  P:  Continue Na checks q 4hrs, corrected 12 mmol/L in 48hrs, 126 this AM, historical baseline appears to be 126-133 Ongoing fluid restriction ETOH cessation when appropriate     Acute hypoxic respiratory failure secondary to metabolic encephalopathy and aspiration after emesis episode On minimal vent settings this AM P:  Trial SBT as mental status improving Full MV support, PRVC VAP / PPI bundle Trend CXR/ ABG CXR clearing, procal negative and afebrile, d/c Unasyn  Severe hypokalemia, hypomagnesemia  P:  repleted and improved, continue to trent  Coffee ground appearing emesis.  Suspect probably has element of gastritis from ETOH. At risk for ileus 2/2 hypokalemia P:  K goal > 4, pending this AM PPI BID Serial CBCs- stable hemoglobin  Macrocytic anemia  P:  Stable, follow CBC  Hypoglycemia  Improved  P:  Glucose improved with TF, monitor, will likely need SSI resumed  Hx HTN P:  On home Metoprolol, hypertensive today so resume home Cardizem  Chronic hiccups P:  Holding home horazine  Best practice:  Diet: NPO;  TF Pain/Anxiety/Delirium protocol (if indicated): propofol/ prn fentanyl  VAP protocol (if indicated): yes DVT prophylaxis: no further coffee ground emesis, start prophylactic lovenox GI prophylaxis: PPI BID  Glucose control: trend poc gluc Mobility: BR Code Status: full code  Family Communication: pt's daughter Clide CliffLaverne updated Disposition: ICU   Labs   CBC: Recent Labs  Lab 06/30/20 0931 06/30/20 0932 06/30/20 1415  06/30/20 1834 07/01/20 0500 07/01/20 1700 07/02/20 0458  WBC 8.1  --   --  10.0 9.2 6.4 6.4  NEUTROABS 5.5  --   --   --   --   --   --   HGB 11.6*   < > 13.9 12.0* 11.6* 10.7* 9.7*  HCT 36.0*   < > 41.0 35.8* 34.3* 32.7* 28.8*  MCV 81.4  --   --  77.5* 78.3* 79.8* 80.2  PLT 130*  --   --  143* 151 132* 121*   < > = values in this interval not displayed.    Basic Metabolic Panel: Recent Labs  Lab 06/30/20 0931 06/30/20 0931 06/30/20 0932 06/30/20 0932 06/30/20 1207 06/30/20 1207 06/30/20 1415 06/30/20 1814 06/30/20 1834 06/30/20 2056 07/01/20 0500 07/01/20 0500 07/01/20 0808 07/01/20 1155 07/01/20 1700 07/01/20 1830 07/01/20 2100 07/01/20 2258 07/02/20 0058 07/02/20 0306 07/02/20 0458  NA 114*   < > 115*   < > 113*   < > 116*   < > 122*   < > 119*   < > 125*   < > 125*   < > 125* 124* 126* 124* 125*  K 2.5*   < > 2.4*   < > 3.6  --  3.5  --  3.2*  --  3.7  --  3.7  --   --   --   --   --   --   --   --  CL 80*   < > 79*  --  77*  --   --   --  83*  --  85*  --  90*  --   --   --   --   --   --   --   --   CO2 20*  --   --   --  20*  --   --   --  28  --  24  --  24  --   --   --   --   --   --   --   --   GLUCOSE 137*   < > 139*  --  106*  --   --   --  96  --  98  --  99  --   --   --   --   --   --   --   --   BUN <5*   < > 4*  --  6*  --   --   --  6*  --  6*  --  5*  --   --   --   --   --   --   --   --   CREATININE 0.82   < > 0.90  --  0.73  --   --   --  0.78  --  1.01  --  1.06  --   --   --   --   --   --   --   --   CALCIUM 7.9*  --   --   --  8.0*  --   --   --  8.2*  --  7.9*  --  7.9*  --   --   --   --   --   --   --   --   MG  --   --   --   --  1.2*   < >  --   --  1.8  --  2.6*  --  3.1*  --  2.1  --   --   --   --   --  2.2  PHOS  --   --   --   --   --   --   --   --   --   --   --   --  3.3  --  3.5  --   --   --   --   --  3.6   < > = values in this interval not displayed.   GFR: Estimated Creatinine Clearance: 57.7 mL/min (by C-G formula based  on SCr of 1.06 mg/dL). Recent Labs  Lab 06/30/20 1834 07/01/20 0500 07/01/20 1700 07/02/20 0458  WBC 10.0 9.2 6.4 6.4    Liver Function Tests: Recent Labs  Lab 06/30/20 0931 07/01/20 0808  AST 29 49*  ALT 12 17  ALKPHOS 64 58  BILITOT 1.2 1.1  PROT 7.1 6.4*  ALBUMIN 4.0 3.3*   No results for input(s): LIPASE, AMYLASE in the last 168 hours. No results for input(s): AMMONIA in the last 168 hours.  ABG    Component Value Date/Time   PHART 7.388 06/30/2020 1415   PCO2ART 45.1 06/30/2020 1415   PO2ART 266 (H) 06/30/2020 1415   HCO3 27.2 06/30/2020 1415   TCO2 29 06/30/2020 1415   O2SAT 100.0 06/30/2020 1415     Coagulation Profile: Recent Labs  Lab 06/30/20 0931  INR 1.1    Cardiac Enzymes: No results for input(s): CKTOTAL, CKMB, CKMBINDEX, TROPONINI in the last 168 hours.  HbA1C: Hgb A1c MFr Bld  Date/Time Value Ref Range Status  06/30/2020 06:34 PM 5.2 4.8 - 5.6 % Final    Comment:    (NOTE) Pre diabetes:          5.7%-6.4%  Diabetes:              >6.4%  Glycemic control for   <7.0% adults with diabetes     CBG: Recent Labs  Lab 07/01/20 1116 07/01/20 1509 07/01/20 1925 07/01/20 2311 07/02/20 0314  GLUCAP 100* 85 99 111* 157*     Critical care time: 35 min    CRITICAL CARE Performed by: Darcella Gasman [facility]   Total critical care time: 35 minutes  Critical care time was exclusive of separately billable procedures and treating other patients.  Critical care was necessary to treat or prevent imminent or life-threatening deterioration.  Critical care was time spent personally by me on the following activities: development of treatment plan with patient and/or surrogate as well as nursing, discussions with consultants, evaluation of patient's response to treatment, examination of patient, obtaining history from patient or surrogate, ordering and performing treatments and interventions, ordering and review of laboratory studies, ordering and  review of radiographic studies, pulse oximetry and re-evaluation of patient's condition.   Darcella Gasman Nyellie Yetter, PA-C [facility]  Pager# [phone], if no answer [phone]

## 2020-07-03 DIAGNOSIS — E871 Hypo-osmolality and hyponatremia: Secondary | ICD-10-CM | POA: Diagnosis not present

## 2020-07-03 LAB — BASIC METABOLIC PANEL
Anion gap: 9 (ref 5–15)
Anion gap: 9 (ref 5–15)
BUN: 8 mg/dL (ref 8–23)
BUN: 10 mg/dL (ref 8–23)
CO2: 25 mmol/L (ref 22–32)
CO2: 26 mmol/L (ref 22–32)
Calcium: 8.6 mg/dL — ABNORMAL LOW (ref 8.9–10.3)
Calcium: 8.9 mg/dL (ref 8.9–10.3)
Chloride: 99 mmol/L (ref 98–111)
Chloride: 100 mmol/L (ref 98–111)
Creatinine, Ser: 0.79 mg/dL (ref 0.61–1.24)
Creatinine, Ser: 0.97 mg/dL (ref 0.61–1.24)
GFR, Estimated: >60 mL/min (ref >60)
GFR, Estimated: >60 mL/min (ref >60)
Glucose, Bld: 156 mg/dL — ABNORMAL HIGH (ref 70–99)
Glucose, Bld: 122 mg/dL — ABNORMAL HIGH (ref 70–99)
Potassium: 3.2 mmol/L — ABNORMAL LOW (ref 3.5–5.1)
Potassium: 3.9 mmol/L (ref 3.5–5.1)
Sodium: 133 mmol/L — ABNORMAL LOW (ref 135–145)
Sodium: 135 mmol/L (ref 135–145)

## 2020-07-03 LAB — GLUCOSE, CAPILLARY
Glucose-Capillary: 124 mg/dL — ABNORMAL HIGH (ref 70–99)
Glucose-Capillary: 132 mg/dL — ABNORMAL HIGH (ref 70–99)
Glucose-Capillary: 100 mg/dL — ABNORMAL HIGH (ref 70–99)
Glucose-Capillary: 66 mg/dL — ABNORMAL LOW (ref 70–99)
Glucose-Capillary: 98 mg/dL (ref 70–99)
Glucose-Capillary: 74 mg/dL (ref 70–99)
Glucose-Capillary: 92 mg/dL (ref 70–99)

## 2020-07-03 LAB — CBC
HCT: 31 % — ABNORMAL LOW (ref 39.0–52.0)
Hemoglobin: 10.1 g/dL — ABNORMAL LOW (ref 13.0–17.0)
MCH: 26.4 pg (ref 26.0–34.0)
MCHC: 32.6 g/dL (ref 30.0–36.0)
MCV: 80.9 fL (ref 80.0–100.0)
Platelets: 119 10*3/uL — ABNORMAL LOW (ref 150–400)
RBC: 3.83 MIL/uL — ABNORMAL LOW (ref 4.22–5.81)
RDW: 15.5 % (ref 11.5–15.5)
WBC: 4.5 10*3/uL (ref 4.0–10.5)
nRBC: 0 % (ref 0.0–0.2)

## 2020-07-03 LAB — MAGNESIUM: Magnesium: 1.8 mg/dL (ref 1.7–2.4)

## 2020-07-03 LAB — SODIUM: Sodium: 135 mmol/L (ref 135–145)

## 2020-07-03 MED ORDER — PANTOPRAZOLE SODIUM 40 MG PO PACK
40.0000 mg | PACK | Freq: Every day | ORAL | Status: DC
  Administered 2020-07-03: 40 mg
  Filled 2020-07-03: qty 20

## 2020-07-03 MED ORDER — AMLODIPINE BESYLATE 10 MG PO TABS: 10.0000 mg | ORAL_TABLET | Freq: Every day | ORAL | Status: DC

## 2020-07-03 MED ORDER — ORAL CARE MOUTH RINSE
15.0000 mL | Freq: Two times a day (BID) | OROMUCOSAL | Status: DC
  Administered 2020-07-03: 15 mL via OROMUCOSAL

## 2020-07-03 MED ORDER — POTASSIUM CHLORIDE 20 MEQ/15ML (10%) PO SOLN
40.0000 meq | Freq: Two times a day (BID) | ORAL | Status: AC
  Administered 2020-07-03 (×2): 40 meq
  Filled 2020-07-03 (×2): qty 30

## 2020-07-03 MED ORDER — LEVETIRACETAM 100 MG/ML PO SOLN
1000.0000 mg | Freq: Two times a day (BID) | ORAL | Status: DC
  Administered 2020-07-03: 1000 mg
  Filled 2020-07-03: qty 10

## 2020-07-03 MED ORDER — DEXTROSE 50 % IV SOLN
12.5000 g | Freq: Once | INTRAVENOUS | Status: AC
  Administered 2020-07-03: 12.5 g via INTRAVENOUS
  Filled 2020-07-03: qty 50

## 2020-07-03 MED ORDER — MAGNESIUM SULFATE 2 GM/50ML IV SOLN
2.0000 g | Freq: Once | INTRAVENOUS | Status: AC
  Administered 2020-07-03: 2 g via INTRAVENOUS
  Filled 2020-07-03: qty 50

## 2020-07-03 NOTE — Progress Notes (Signed)
  Patient continues to do well after self-extubation, remains on room air.  Daughter, Ameer, updated by phone.    Patient remains at risk for reintubation.  Will continue to monitor in ICU.  Remains NPO for now.        Posey Boyer, ACNP [facility] 07/03/2020, 4:25 PM  See Amion for personal pager PCCM on call pager [phone]

## 2020-07-03 NOTE — Progress Notes (Signed)
Patient self extubated. Patients vitals are stable at this time on room air. RT will continue to monitor if needed.

## 2020-07-03 NOTE — Progress Notes (Addendum)
NAME:  Evan Jacobs, MRN:  [id number], DOB:  27-Oct-1950, LOS: 3 ADMISSION DATE:  06/30/2020, CONSULTATION DATE:  10/31 REFERRING MD:  10/31, CHIEF COMPLAINT:  Acute respiratory failure and acute metabolic encephalopathy   Brief History   69 year old male w/ sig h/o heavy ETOH abuse and chronic hyponatremia (120s and felt w/ h/o beer potomania). Was to be admitted w/working dx of hypertensive crisis, symptomatic hyponatremia (114) and new left sided weakness and acute encephalopathy w/ concern for seizure. Shortly after being treated w/ ativan for his seizure had large volume emesis. Was intubated for airway protection.   Past Medical History  Chronic ETOH abuse, HTN, chronic hyponatremia (120-130), seizure disorder, chronic intractable hiccups.   Significant Hospital Events   10/31: Evaluated, and to be admitted.  Urine osmolality 228, urine sodium 66, initial sodium 114, systolic blood pressure 200s was to the to be admitted for further evaluation and treatment of multifocal metabolic encephalopathy felt likely secondary to hyponatremia, hypertension, possible seizure, and also concern for new stroke.  Was started on normal saline resuscitation, while in ER had large vomiting episode of coffee-ground appearing emesis he was not protecting his airway and therefore was intubated subsequent film concerning for possible aspiration 11/1 Sodium overcorrected overnight and 3% stopped and bolused with d5w which improved sodium transiently now higher at 125.  11/2 No overnight events, Na stabilized 124-126 after 3% stopped and dose of DDABP yesterday,  no further seizure events, cEEG stopped; MRI neg   Consults:  Pulmonary/critical care Neurology  Procedures:  Endotracheal tube 10/31 >> Right IJ triple-lumen catheter 10/31 >> 10/31 foley >>   Significant Diagnostic Tests:  UA 10/31:spec grave 1.021, uosmo: 228, Una 66.   10/31 CTH >> There is no acute intracranial hemorrhage or evidence of  acute infarction. ASPECT score is 10.  Small chronic right frontal infarct.  10/31 EEG >>  1) Generalized periodic discharges with triphasic morphology 2) Generalized high voltage slow activity.  Clinical Interpretation: This EEG is most consistent with a generalized encephalopathy.  The triphasic waves seen can rarely be epileptic in origin, and therefore there could be consideration to an Ativan trial to further characterize this pattern.  (f/u: no definite response to ativan challenge by neurology)  cEEG 11/1 >> 11/2 suggestive of moderate to severe diffuse encephalopathy, nonspecific etiology but likely related to sedation.No seizures or epileptiform discharges were seen   11/2 MR brain w/o >> No acute infarction, hemorrhage, or mass.  Chronic right maxillary sinus. Superimposed acute sinusitis is possible in the appropriate clinical setting.  Micro Data:  10/31 SARS 2/ Flu >> neg 10/31 MRSA PCR >> neg  Antimicrobials:  Unasyn 10/31-11/2  Interim history/subjective:  Afebrile  Weaned PSV 4 hrs yesterday  Required ativan for agitation overnight and precedex restarted, since off this morning.  Following commands in all extremities Failed SBT this am due to poor effort Ongoing sinus bradycardia, into the 40's without hemodynamic changes. (metoprolol held since 11/1 am)  Objective   Blood pressure (!) 178/61, pulse (!) 56, temperature 97.7 F (36.5 C), temperature source Oral, resp. rate 19, height 5\' 6"  (1.676 m), weight 70.5 kg, SpO2 100 %.    Vent Mode: PRVC FiO2 (%):  [30 %-40 %] 30 % Set Rate:  [18 bmp] 18 bmp Vt Set:  [510 mL] 510 mL PEEP:  [5 cmH20] 5 cmH20 Pressure Support:  [10 cmH20-12 cmH20] 10 cmH20 Plateau Pressure:  [15 cmH20-18 cmH20] 16 cmH20   Intake/Output Summary (Last 24 hours)  at 07/03/2020 0727 Last data filed at 07/03/2020 0700 Gross per 24 hour  Intake 1830.85 ml  Output 2220 ml  Net -389.15 ml   Filed Weights   07/01/20 0500 07/02/20 0500  07/03/20 0457  Weight: 69.7 kg 69.4 kg 70.5 kg   General:  Elderly male intubated on MV in NAD HEENT: MM pink/moist, ETT/ OGT, pupils 2-3/reactive Neuro: calm, does not open eyes but will follow commands in all extremitites CV: SB, rr, no murmur PULM:  MV supported breaths, clear bilaterally  GI: soft, bs+, foley   Extremities: warm/dry, no LE edema Skin: no rashes   Resolved Hospital Problem list    Assessment & Plan:   Acute metabolic encephalopathy.  Suspect this is multifactorial.  Primary contributing factors as follows: Hyponatremia, hypertension, possible seizure, and rule out stroke. 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  Will attempt again near lunch VAP / PPI bundle Trend CXR  Severe hypokalemia, hypomagnesemia  P:  Mag 2gm and KCL 40 meg x 2 today   Coffee ground appearing emesis.  Suspect probably has element of gastritis from ETOH. At risk for ileus 2/2 hypokalemia P:  K goal > 4 PPI BID Serial CBCs- stable hemoglobin trend  Macrocytic anemia  P:  Stable, follow CBC  Hypoglycemia  Improved  P:  Glucose improved with TF,  remains within goal off SSI  Hx HTN P:  Given bradycardia, will d/c metoprolol (held since 11/1 due to bradycardia) and Cardizem and try Norvasc    Chronic hiccups P:  Holding home horazine  Moderate malnutrition P:  TF per RD recs   Additional: will look for PIV and d/c CVL and also remove foley today with prn bladder scanning to monitor for urinary retention  Best practice:  Diet: NPO;  TF Pain/Anxiety/Delirium protocol (if indicated):  prn fentanyl  VAP protocol (if indicated): yes DVT prophylaxis:  lovenox GI prophylaxis: PPI BID  Glucose control: q4, remains within goal  Mobility: BR Code Status: full code  Family Communication: daughter, Clide CliffLaverne updated by phone 11/3 Disposition: ICU   Labs   CBC: Recent Labs  Lab 06/30/20 0931 06/30/20 0932 06/30/20 1834 07/01/20 0500 07/01/20 1700 07/02/20 0458 07/03/20 0438  WBC 8.1   < > 10.0 9.2 6.4 6.4 4.5  NEUTROABS 5.5  --   --   --   --   --   --   HGB 11.6*   < > 12.0* 11.6* 10.7* 9.7* 10.1*  HCT 36.0*   < > 35.8* 34.3* 32.7* 28.8* 31.0*  MCV 81.4   < > 77.5* 78.3* 79.8* 80.2 80.9  PLT 130*   < > 143* 151 132* 121* 119*   < > =  values in this interval not displayed.    Basic Metabolic Panel: Recent Labs  Lab 06/30/20 0931 06/30/20 0931 06/30/20 0932 06/30/20 0932 06/30/20 1207 06/30/20 1207 06/30/20 1415 06/30/20 1814 06/30/20 1834 06/30/20 2056 07/01/20 0500 07/01/20 0500 07/01/20 0808 07/01/20 1155 07/01/20 1700 07/01/20 1830 07/02/20 0458 07/02/20 0645 07/02/20 0851 07/02/20 1115 07/02/20 1658 07/02/20 1700 07/02/20 2015 07/03/20 0242 07/03/20 0438  NA 114*   < > 115*   < > 113*   < > 116*   < > 122*   < > 119*   < > 125*   < > 125*   < > 125*   < > 127* 127* 133*  --  128* 135  --   K 2.5*   < > 2.4*   < > 3.6   < > 3.5  --  3.2*  --  3.7  --  3.7  --   --   --   --   --  3.0*  --   --   --   --   --   --   CL 80*   < > 79*  --  77*  --   --   --  83*  --  85*  --  90*  --   --   --    --   --   --   --   --   --   --   --   --   CO2 20*  --   --   --  20*  --   --   --  28  --  24  --  24  --   --   --   --   --   --   --   --   --   --   --   --   GLUCOSE 137*   < > 139*  --  106*  --   --   --  96  --  98  --  99  --   --   --   --   --   --   --   --   --   --   --   --   BUN <5*   < > 4*  --  6*  --   --   --  6*  --  6*  --  5*  --   --   --   --   --   --   --   --   --   --   --   --   CREATININE 0.82   < > 0.90  --  0.73  --   --   --  0.78  --  1.01  --  1.06  --   --   --   --   --   --   --   --   --   --   --   --   CALCIUM 7.9*  --   --   --  8.0*  --   --   --  8.2*  --  7.9*  --  7.9*  --   --   --   --   --   --   --   --   --   --   --   --   MG  --   --   --   --  1.2*   < >  --   --  1.8   < > 2.6*   < > 3.1*  --  2.1  --  2.2  --   --   --   --  2.0  --   --  1.8  PHOS  --   --   --   --   --   --   --   --   --   --   --   --  3.3  --  3.5  --  3.6  --   --   --   --  3.8  --   --   --    < > = values in this interval not displayed.   GFR: Estimated Creatinine Clearance: 57.7 mL/min (by C-G formula based on SCr of 1.06 mg/dL). Recent Labs  Lab 07/01/20 0500 07/01/20 1700 07/02/20 0458 07/03/20 0438  WBC 9.2 6.4 6.4 4.5    Liver Function Tests: Recent Labs  Lab 06/30/20 0931 07/01/20 0808  AST 29 49*  ALT 12 17  ALKPHOS 64 58  BILITOT 1.2 1.1  PROT 7.1 6.4*  ALBUMIN 4.0 3.3*   No results for input(s): LIPASE, AMYLASE in the last 168 hours. No results for input(s): AMMONIA in the last 168 hours.  ABG    Component Value Date/Time   PHART 7.388 06/30/2020 1415   PCO2ART 45.1 06/30/2020 1415   PO2ART 266 (H) 06/30/2020 1415   HCO3 27.2 06/30/2020 1415   TCO2 29 06/30/2020 1415   O2SAT 100.0 06/30/2020 1415     Coagulation Profile: Recent Labs  Lab 06/30/20 0931  INR 1.1    Cardiac Enzymes: No results for input(s): CKTOTAL, CKMB, CKMBINDEX, TROPONINI in the last 168 hours.  HbA1C: Hgb A1c MFr Bld  Date/Time Value Ref Range  Status  06/30/2020 06:34 PM 5.2 4.8 - 5.6 % Final    Comment:    (NOTE) Pre diabetes:          5.7%-6.4%  Diabetes:              >6.4%  Glycemic control for   <7.0% adults with diabetes     CBG: Recent Labs  Lab 07/02/20 1113 07/02/20 1541 07/02/20 1921 07/02/20 2318 07/03/20 0313  GLUCAP 138* 131* 95 129* 124*     Critical care time: 35 min    CRITICAL CARE Performed by: Posey Boyer   Total critical care time: 35 minutes  Critical care time was exclusive of separately billable procedures and treating other patients.  Critical care was necessary to treat or prevent imminent or life-threatening deterioration.  Critical care was time spent personally by me on the following activities: development of treatment plan with patient and/or surrogate as well as nursing, discussions with consultants, evaluation of patient's response to treatment, examination of patient, obtaining history from patient or surrogate, ordering and performing treatments and interventions, ordering and review of laboratory studies, ordering and review of radiographic studies, pulse oximetry and re-evaluation of patient's condition.   Posey Boyer, ACNP [facility] 07/03/2020, 7:27 AM  See Loretha Stapler for personal pager PCCM on call pager [phone]

## 2020-07-03 NOTE — Progress Notes (Signed)
Kelly RN aware of order to d/c CVC.  

## 2020-07-04 DIAGNOSIS — E871 Hypo-osmolality and hyponatremia: Secondary | ICD-10-CM | POA: Diagnosis not present

## 2020-07-04 LAB — CBC
HCT: 33.9 % — ABNORMAL LOW (ref 39.0–52.0)
Hemoglobin: 10.6 g/dL — ABNORMAL LOW (ref 13.0–17.0)
MCH: 25.9 pg — ABNORMAL LOW (ref 26.0–34.0)
MCHC: 31.3 g/dL (ref 30.0–36.0)
MCV: 82.9 fL (ref 80.0–100.0)
Platelets: 168 10*3/uL (ref 150–400)
RBC: 4.09 MIL/uL — ABNORMAL LOW (ref 4.22–5.81)
RDW: 16 % — ABNORMAL HIGH (ref 11.5–15.5)
WBC: 5.5 10*3/uL (ref 4.0–10.5)
nRBC: 0 % (ref 0.0–0.2)

## 2020-07-04 LAB — BASIC METABOLIC PANEL
Anion gap: 11 (ref 5–15)
BUN: 10 mg/dL (ref 8–23)
CO2: 23 mmol/L (ref 22–32)
Calcium: 9.1 mg/dL (ref 8.9–10.3)
Chloride: 102 mmol/L (ref 98–111)
Creatinine, Ser: 0.78 mg/dL (ref 0.61–1.24)
GFR, Estimated: >60 mL/min (ref >60)
Glucose, Bld: 91 mg/dL (ref 70–99)
Potassium: 4.7 mmol/L (ref 3.5–5.1)
Sodium: 136 mmol/L (ref 135–145)

## 2020-07-04 LAB — GLUCOSE, CAPILLARY
Glucose-Capillary: 105 mg/dL — ABNORMAL HIGH (ref 70–99)
Glucose-Capillary: 124 mg/dL — ABNORMAL HIGH (ref 70–99)
Glucose-Capillary: 88 mg/dL (ref 70–99)
Glucose-Capillary: 88 mg/dL (ref 70–99)
Glucose-Capillary: 92 mg/dL (ref 70–99)
Glucose-Capillary: 94 mg/dL (ref 70–99)

## 2020-07-04 LAB — PHOSPHORUS: Phosphorus: 4.6 mg/dL (ref 2.5–4.6)

## 2020-07-04 LAB — MAGNESIUM: Magnesium: 1.8 mg/dL (ref 1.7–2.4)

## 2020-07-04 MED ORDER — LEVETIRACETAM 100 MG/ML PO SOLN: 1000.0000 mg | Freq: Two times a day (BID) | ORAL | Status: DC

## 2020-07-04 MED ORDER — HYDRALAZINE HCL 20 MG/ML IJ SOLN
10.0000 mg | Freq: Four times a day (QID) | INTRAMUSCULAR | Status: DC | PRN
  Administered 2020-07-04: 10 mg via INTRAVENOUS
  Filled 2020-07-04: qty 1

## 2020-07-04 MED ORDER — ACETAMINOPHEN 325 MG PO TABS
650.0000 mg | ORAL_TABLET | Freq: Four times a day (QID) | ORAL | Status: DC | PRN
  Administered 2020-07-04 – 2020-07-06 (×3): 650 mg via ORAL
  Filled 2020-07-04 (×3): qty 2

## 2020-07-04 MED ORDER — CHLORPROMAZINE HCL 25 MG PO TABS
25.0000 mg | ORAL_TABLET | Freq: Three times a day (TID) | ORAL | Status: DC
  Administered 2020-07-05 – 2020-07-06 (×4): 25 mg via ORAL
  Filled 2020-07-04 (×7): qty 1

## 2020-07-04 MED ORDER — FOLIC ACID 1 MG PO TABS
1.0000 mg | ORAL_TABLET | Freq: Every day | ORAL | Status: DC
  Administered 2020-07-04 – 2020-07-06 (×3): 1 mg via ORAL
  Filled 2020-07-04 (×3): qty 1

## 2020-07-04 MED ORDER — THIAMINE HCL 100 MG PO TABS
100.0000 mg | ORAL_TABLET | Freq: Every day | ORAL | Status: DC
  Administered 2020-07-04 – 2020-07-06 (×3): 100 mg via ORAL
  Filled 2020-07-04 (×3): qty 1

## 2020-07-04 MED ORDER — LEVETIRACETAM 500 MG PO TABS
1000.0000 mg | ORAL_TABLET | Freq: Two times a day (BID) | ORAL | Status: DC
  Administered 2020-07-04 – 2020-07-06 (×5): 1000 mg via ORAL
  Filled 2020-07-04 (×5): qty 2

## 2020-07-04 MED ORDER — DOCUSATE SODIUM 100 MG PO CAPS
100.0000 mg | ORAL_CAPSULE | Freq: Every day | ORAL | Status: DC
  Administered 2020-07-05 – 2020-07-06 (×2): 100 mg via ORAL
  Filled 2020-07-04 (×2): qty 1

## 2020-07-04 MED ORDER — ADULT MULTIVITAMIN LIQUID CH
15.0000 mL | Freq: Every day | ORAL | Status: DC
  Administered 2020-07-04 – 2020-07-06 (×3): 15 mL via ORAL
  Filled 2020-07-04 (×3): qty 15

## 2020-07-04 MED ORDER — METOPROLOL TARTRATE 50 MG PO TABS
50.0000 mg | ORAL_TABLET | Freq: Two times a day (BID) | ORAL | Status: DC
  Administered 2020-07-05 – 2020-07-06 (×4): 50 mg via ORAL
  Filled 2020-07-04 (×4): qty 1

## 2020-07-04 MED ORDER — PANTOPRAZOLE SODIUM 40 MG PO PACK: 40.0000 mg | PACK | Freq: Every day | ORAL | Status: DC

## 2020-07-04 MED ORDER — DOCUSATE SODIUM 50 MG/5ML PO LIQD: 100.0000 mg | Freq: Two times a day (BID) | ORAL | Status: DC

## 2020-07-04 MED ORDER — PANTOPRAZOLE SODIUM 40 MG PO TBEC
40.0000 mg | DELAYED_RELEASE_TABLET | Freq: Two times a day (BID) | ORAL | Status: DC
  Administered 2020-07-04 – 2020-07-06 (×4): 40 mg via ORAL
  Filled 2020-07-04 (×4): qty 1

## 2020-07-04 MED ORDER — AMLODIPINE BESYLATE 10 MG PO TABS
10.0000 mg | ORAL_TABLET | Freq: Every day | ORAL | Status: DC
  Administered 2020-07-04 – 2020-07-06 (×3): 10 mg via ORAL
  Filled 2020-07-04 (×3): qty 1
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
## 2020-07-04 NOTE — Progress Notes (Signed)
NAME:  Evan Jacobs, MRN:  [id number], DOB:  [birth year], LOS: 4 ADMISSION DATE:  06/30/2020, CONSULTATION DATE:  10/31 REFERRING MD:  10/31, CHIEF COMPLAINT:  Acute respiratory failure and acute metabolic encephalopathy   Brief History   69 year old male w/ sig h/o heavy ETOH abuse and chronic hyponatremia (120s and felt w/ h/o beer potomania). Was to be admitted w/working dx of hypertensive crisis, symptomatic hyponatremia (114) and new left sided weakness and acute encephalopathy w/ concern for seizure. Shortly after being treated w/ ativan for his seizure had large volume emesis. Was intubated for airway protection.   Past Medical History  Chronic ETOH abuse, HTN, chronic hyponatremia (120-130), seizure disorder, chronic intractable hiccups.   Significant Hospital Events   10/31: Evaluated, and to be admitted.  Urine osmolality 228, urine sodium 66, initial sodium 114, systolic blood pressure 200s was to the to be admitted for further evaluation and treatment of multifocal metabolic encephalopathy felt likely secondary to hyponatremia, hypertension, possible seizure, and also concern for new stroke.  Was started on normal saline resuscitation, while in ER had large vomiting episode of coffee-ground appearing emesis he was not protecting his airway and therefore was intubated subsequent film concerning for possible aspiration 11/1 Sodium overcorrected overnight and 3% stopped and bolused with d5w which improved sodium transiently now higher at 125.  11/2 No overnight events, Na stabilized 124-126 after 3% stopped and dose of DDABP yesterday,  no further seizure events, cEEG stopped; MRI neg   Consults:  Pulmonary/critical care Neurology  Procedures:  Endotracheal tube 10/31 >> Right IJ triple-lumen catheter 10/31 >> 10/31 foley >>   Significant Diagnostic Tests:  UA 10/31:spec grave 1.021, uosmo: 228, Una 66.   10/31 CTH >> There is no acute intracranial hemorrhage or evidence of  acute infarction. ASPECT score is 10.  Small chronic right frontal infarct.  10/31 EEG >>  1) Generalized periodic discharges with triphasic morphology 2) Generalized high voltage slow activity.  Clinical Interpretation: This EEG is most consistent with a generalized encephalopathy.  The triphasic waves seen can rarely be epileptic in origin, and therefore there could be consideration to an Ativan trial to further characterize this pattern.  (f/u: no definite response to ativan challenge by neurology)  cEEG 11/1 >> 11/2 suggestive of moderate to severe diffuse encephalopathy, nonspecific etiology but likely related to sedation.No seizures or epileptiform discharges were seen   11/2 MR brain w/o >> No acute infarction, hemorrhage, or mass.  Chronic right maxillary sinus. Superimposed acute sinusitis is possible in the appropriate clinical setting.  Micro Data:  10/31 SARS 2/ Flu >> neg 10/31 MRSA PCR >> neg  Antimicrobials:  Unasyn 10/31-11/2  Interim history/subjective:  Self extubated yesterday.  Tol well.  Awake and alert. C/o mild HA, otherwise no c/o.  VSS.   Objective   Blood pressure (!) 210/83, pulse 94, temperature 99.6 F (37.6 C), temperature source Oral, resp. rate (!) 29, height 5\' 6"  (1.676 m), weight 70.7 kg, SpO2 100 %.    Vent Mode: PRVC FiO2 (%):  [30 %] 30 % Set Rate:  [18 bmp] 18 bmp Vt Set:  [510 mL] 510 mL PEEP:  [5 cmH20] 5 cmH20 Plateau Pressure:  [17 cmH20] 17 cmH20   Intake/Output Summary (Last 24 hours) at 07/04/2020 0904 Last data filed at 07/04/2020 0500 Gross per 24 hour  Intake 420.07 ml  Output 1050 ml  Net -629.93 ml   Filed Weights   07/02/20 0500 07/03/20 0457 07/04/20 0500  Weight:  69.4 kg 70.5 kg 70.7 kg   General:  Elderly male NAD, pleasant HEENT: MM pink/moist Neuro:awake, alert, mostly appropriate, gen weakness, MAE CV: SB, rr, no murmur PULM:  resps even non labored on RA, diminished bases, otherwise clear  GI: soft, bs+,  foley   Extremities: warm/dry, no LE edema Skin: no rashes   Resolved Hospital Problem list    Assessment & Plan:   Acute metabolic encephalopathy.  Suspect this is multifactorial.  Primary contributing factors as follows: Hyponatremia, hypertension, possible seizure, and rule out stroke. 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  Suspect probably has element of gastritis from ETOH. At risk for ileus 2/2 hypokalemia P:  K goal > 4 PPI BID Serial CBCs- stable hemoglobin trend  Macrocytic anemia  P:  Stable, follow CBC  Hypoglycemia  Improved  P:  SSI   Hx HTN P:  Continue norvasc  Metoprolol and diltiazem d/c r/t bradycardia   Chronic hiccups P:  Holding home horazine  Moderate malnutrition P:  TF per RD recs   PT/OT  tx out of ICU  Will ask TRH to assume care 11/5   Best practice:  Diet: advance diet  Pain/Anxiety/Delirium protocol (if indicated):  n/a VAP protocol (if indicated): yes DVT prophylaxis:  lovenox GI prophylaxis: PPI BID  Glucose control: q4, remains within goal  Mobility: BR Code Status: full code  Family  Communication: daughter, Clide CliffLaverne updated by phone 11/3 Disposition: ICU   Labs   CBC: Recent Labs  Lab 06/30/20 0931 06/30/20 0932 07/01/20 0500 07/01/20 1700 07/02/20 0458 07/03/20 0438 07/04/20 0349  WBC 8.1   < > 9.2 6.4 6.4 4.5 5.5  NEUTROABS 5.5  --   --   --   --   --   --   HGB 11.6*   < > 11.6* 10.7* 9.7* 10.1* 10.6*  HCT 36.0*   < > 34.3* 32.7* 28.8* 31.0* 33.9*  MCV 81.4   < > 78.3* 79.8* 80.2 80.9 82.9  PLT 130*   < > 151 132* 121* 119* 168   < > = values in this interval not displayed.    Basic Metabolic Panel: Recent Labs  Lab 07/01/20 0500 07/01/20 0500 07/01/20 0808 07/01/20 1155 07/01/20 1700 07/01/20 1830 07/02/20 0458 07/02/20 0645 07/02/20 0851 07/02/20 1115 07/02/20 1658 07/02/20 1700 07/02/20 2015 07/03/20 0242 07/03/20 0438 07/03/20 1745 07/04/20 0349  NA 119*   < > 125*   < > 125*   < > 125*   < > 127*   < >   < >  --  128* 135 133* 135 136  K 3.7   < > 3.7  --   --   --   --   --  3.0*  --   --   --   --   --  3.2* 3.9 4.7  CL 85*  --  90*  --   --   --   --   --   --   --   --   --   --   --  99 100 102  CO2 24  --  24  --   --   --   --   --   --   --   --   --   --   --  25 26 23   GLUCOSE 98  --  99  --   --   --   --   --   --   --   --   --   --   --  156* 122* 91  BUN 6*  --  5*  --   --   --   --   --   --   --   --   --   --   --  8 10 10   CREATININE 1.01  --  1.06  --   --   --   --   --   --   --   --   --   --   --  0.79 0.97 0.78  CALCIUM 7.9*  --  7.9*  --   --   --   --   --   --   --   --   --   --   --  8.6* 8.9 9.1  MG 2.6*   < > 3.1*   < > 2.1  --  2.2  --   --   --   --  2.0  --   --  1.8  --  1.8  PHOS  --   --  3.3  --  3.5  --  3.6  --   --   --   --  3.8  --   --   --   --  4.6   < > = values in this interval not displayed.   GFR: Estimated Creatinine Clearance: 76.4 mL/min (by C-G formula based on SCr of 0.78 mg/dL). Recent Labs  Lab 07/01/20 1700 07/02/20 0458 07/03/20 0438 07/04/20 0349  WBC 6.4 6.4 4.5  5.5    Liver Function Tests: Recent Labs  Lab 06/30/20 0931 07/01/20 0808  AST 29 49*  ALT 12 17  ALKPHOS 64 58  BILITOT 1.2 1.1  PROT 7.1 6.4*  ALBUMIN 4.0 3.3*   No results for input(s): LIPASE, AMYLASE in the last 168 hours. No results for input(s): AMMONIA in the last 168 hours.  ABG    Component Value Date/Time   PHART 7.388 06/30/2020 1415   PCO2ART 45.1 06/30/2020 1415   PO2ART 266 (H) 06/30/2020 1415   HCO3 27.2 06/30/2020 1415   TCO2 29 06/30/2020 1415   O2SAT 100.0 06/30/2020 1415     Coagulation Profile: Recent Labs  Lab 06/30/20 0931  INR 1.1    Cardiac Enzymes: No results for input(s): CKTOTAL, CKMB, CKMBINDEX, TROPONINI in the last 168 hours.  HbA1C: Hgb A1c MFr Bld  Date/Time Value Ref Range Status  06/30/2020 06:34 PM 5.2 4.8 - 5.6 % Final    Comment:    (NOTE) Pre diabetes:          5.7%-6.4%  Diabetes:              >  6.4%  Glycemic control for   <7.0% adults with diabetes     CBG: Recent Labs  Lab 07/03/20 1624 07/03/20 1904 07/03/20 2306 07/04/20 0309 07/04/20 0738  GLUCAP 98 74 92 88 88    Dirk Dress, NP Pulmonary[facility] Care Medicine  07/04/2020  9:04 AM

## 2020-07-04 NOTE — Progress Notes (Addendum)
HOSPITAL MEDICINE OVERNIGHT EVENT NOTE    Notified by nursing that patient has been exhibiting progressively increasing hypertension since arrival from the intensive care unit earlier today. This is associated with severe intractable hiccups.  Chart reviewed including patient's home medications.  Patient is typically on Thorazine 3 times daily in the outpatient setting. This will be resumed. I note the patient is already receiving PPI therapy as well which is appropriate in a patient with chronic intractable hiccups.  Concerning the hypertension, as needed hydralazine was administered without significant improvement in blood pressures. Patient is already receiving amlodipine 10 mg daily. According to CCM notes, patient was on metoprolol and diltiazem at time of admission, both which were discontinued due to bradycardia. Patient is now tachycardic. Will restart home regimen metoprolol 50 mg twice daily.   Monitoring closely for clinical improvement of blood pressures, hiccups and heart rate.  Marinda Elk  MD [facility]
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
## 2020-07-04 NOTE — Evaluation (Signed)
Physical Therapy Evaluation Patient Details Name: Evan Jacobs MRN: [id number] DOB: May 16, 1951 Today's Date: 07/04/2020   History of Present Illness  69 year old male w/ sig h/o heavy ETOH abuse and chronic hyponatremia (120s and felt w/ h/o beer potomania). Was to be admitted w/working dx of hypertensive crisis, symptomatic hyponatremia (114) and new left sided weakness and acute encephalopathy w/ concern for seizure. Shortly after being treated w/ ativan for his seizure had large volume emesis. Was intubated for airway protection on 10/31. Pt self extubated on 11/3.  Clinical Impression  Pt presents to PT with deficits in cognition, functional mobility, gait, balance, memory, safety awareness. PT unsure of pt's cognitive baseline, pt reports he did not go through much schooling but is disoriented to place/time and demonstrates very limited awareness of deficits. Pt with increased postural sway and staggering gait, requiring PT assistance to prevent multiple potential falls. Pt will benefit from continued acute PT services to reduce falls risk. PT recommends SNF placement a this time due to high falls risk and poor safety awareness.    Follow Up Recommendations SNF    Equipment Recommendations  None recommended by PT    Recommendations for Other Services       Precautions / Restrictions Precautions Precautions: Fall Restrictions Weight Bearing Restrictions: No      Mobility  Bed Mobility Overal bed mobility: Needs Assistance Bed Mobility: Supine to Sit;Sit to Supine     Supine to sit: Supervision Sit to supine: Supervision        Transfers Overall transfer level: Needs assistance Equipment used: None Transfers: Sit to/from Stand Sit to Stand: Min guard            Ambulation/Gait Ambulation/Gait assistance: Min assist;Mod assist Gait Distance (Feet): 50 Feet Assistive device: None Gait Pattern/deviations: Step-through pattern;Drifts right/left;Staggering  left;Staggering right Gait velocity: reduced Gait velocity interpretation: <1.8 ft/sec, indicate of risk for recurrent falls General Gait Details: pt with increased lateral sway and multiple losses of balance, requiring constant minA and modA for losses of balance  Stairs            Wheelchair Mobility    Modified Rankin (Stroke Patients Only)       Balance Overall balance assessment: Needs assistance Sitting-balance support: No upper extremity supported;Feet supported Sitting balance-Leahy Scale: Good     Standing balance support: No upper extremity supported Standing balance-Leahy Scale: Poor Standing balance comment: minG-minA for static standing balance                             Pertinent Vitals/Pain Pain Assessment: No/denies pain    Home Living Family/patient expects to be discharged to:: Private residence Living Arrangements: Alone Available Help at Discharge: Friend(s);Available PRN/intermittently (significant other, possible 24/7?) Type of Home: Apartment Home Access: Stairs to enter Entrance Stairs-Rails: Right;Left Entrance Stairs-Number of Steps: 3 Home Layout: One level Home Equipment: Cane - single point;Walker - 2 wheels Additional Comments: Pt frequently stays with his girlfriend per chart review    Prior Function Level of Independence: Needs assistance   Gait / Transfers Assistance Needed: pt ambulates with use of cane  ADL's / Homemaking Assistance Needed: pt reports his significant other has been performing most IADLs over the past few months        Hand Dominance   Dominant Hand: Right    Extremity/Trunk Assessment   Upper Extremity Assessment Upper Extremity Assessment: Overall WFL for tasks assessed (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)    Lower Extremity Assessment Lower Extremity Assessment: Overall WFL for tasks assessed    Cervical / Trunk Assessment Cervical / Trunk Assessment: Normal   Communication   Communication: No difficulties  Cognition Arousal/Alertness: Awake/alert Behavior During Therapy: WFL for tasks assessed/performed Overall Cognitive Status: No family/caregiver present to determine baseline cognitive functioning Area of Impairment: Orientation;Memory;Safety/judgement;Awareness;Following commands;Problem solving                 Orientation Level: Disoriented to;Place;Time   Memory: Decreased recall of precautions;Decreased short-term memory Following Commands: Follows one step commands consistently Safety/Judgement: Decreased awareness of safety;Decreased awareness of deficits Awareness: Intellectual Problem Solving: Slow processing;Requires verbal cues        General Comments General comments (skin integrity, edema, etc.): VSS on RA    Exercises     Assessment/Plan    PT Assessment Patient needs continued PT services  PT Problem List Decreased balance;Decreased mobility;Decreased cognition;Decreased knowledge of use of DME;Decreased safety awareness;Decreased knowledge of precautions       PT Treatment Interventions Gait training;DME instruction;Stair training;Functional mobility training;Therapeutic activities;Therapeutic exercise;Neuromuscular re-education;Balance training;Patient/family education;Cognitive remediation    PT Goals (Current goals can be found in the Care Plan section)  Acute Rehab PT Goals Patient Stated Goal: To return to prior level of function PT Goal Formulation: With patient Time For Goal Achievement: 07/18/20 Potential to Achieve Goals: Good    Frequency Min 3X/week   Barriers to discharge        Co-evaluation               AM-PAC PT "6 Clicks" Mobility  Outcome Measure Help needed turning from your back to your side while in a flat bed without using bedrails?: None Help needed moving from lying on your back to sitting on the side of a flat bed without using bedrails?: None Help needed moving to  and from a bed to a chair (including a wheelchair)?: A Little Help needed standing up from a chair using your arms (e.g., wheelchair or bedside chair)?: A Little Help needed to walk in hospital room?: A Lot Help needed climbing 3-5 steps with a railing? : Total 6 Click Score: 17    End of Session   Activity Tolerance: Patient tolerated treatment well Patient left: in bed;with call bell/phone within reach;with bed alarm set Nurse Communication: Mobility status PT Visit Diagnosis: Unsteadiness on feet (R26.81);Other abnormalities of gait and mobility (R26.89)    Time: 4098-1191 PT Time Calculation (min) (ACUTE ONLY): 18 min   Charges:   PT Evaluation $PT Eval Moderate Complexity: 1 Mod          Arlyss Gandy, PT, DPT Acute Rehabilitation Pager: [phone]   Arlyss Gandy 07/04/2020, 4:52 PM

## 2020-07-05 LAB — BASIC METABOLIC PANEL
Anion gap: 10 (ref 5–15)
BUN: 6 mg/dL — ABNORMAL LOW (ref 8–23)
CO2: 25 mmol/L (ref 22–32)
Calcium: 9.3 mg/dL (ref 8.9–10.3)
Chloride: 97 mmol/L — ABNORMAL LOW (ref 98–111)
Creatinine, Ser: 0.78 mg/dL (ref 0.61–1.24)
GFR, Estimated: >60 mL/min (ref >60)
Glucose, Bld: 96 mg/dL (ref 70–99)
Potassium: 3.8 mmol/L (ref 3.5–5.1)
Sodium: 132 mmol/L — ABNORMAL LOW (ref 135–145)

## 2020-07-05 LAB — GLUCOSE, CAPILLARY
Glucose-Capillary: 95 mg/dL (ref 70–99)
Glucose-Capillary: 96 mg/dL (ref 70–99)
Glucose-Capillary: 157 mg/dL — ABNORMAL HIGH (ref 70–99)
Glucose-Capillary: 98 mg/dL (ref 70–99)
Glucose-Capillary: 99 mg/dL (ref 70–99)
Glucose-Capillary: 98 mg/dL (ref 70–99)

## 2020-07-05 LAB — CBC
HCT: 32.3 % — ABNORMAL LOW (ref 39.0–52.0)
Hemoglobin: 10.4 g/dL — ABNORMAL LOW (ref 13.0–17.0)
MCH: 26.3 pg (ref 26.0–34.0)
MCHC: 32.2 g/dL (ref 30.0–36.0)
MCV: 81.6 fL (ref 80.0–100.0)
Platelets: 190 10*3/uL (ref 150–400)
RBC: 3.96 MIL/uL — ABNORMAL LOW (ref 4.22–5.81)
RDW: 15.9 % — ABNORMAL HIGH (ref 11.5–15.5)
WBC: 6.6 10*3/uL (ref 4.0–10.5)
nRBC: 0 % (ref 0.0–0.2)

## 2020-07-05 MED ORDER — HYDROCHLOROTHIAZIDE 25 MG PO TABS: 25.0000 mg | ORAL_TABLET | Freq: Every day | ORAL | Status: DC

## 2020-07-05 MED ORDER — HYDROCHLOROTHIAZIDE 50 MG PO TABS: 50.0000 mg | ORAL_TABLET | Freq: Every day | ORAL | Status: DC

## 2020-07-05 MED ORDER — ENSURE MAX PROTEIN PO LIQD
11.0000 [oz_av] | Freq: Two times a day (BID) | ORAL | Status: DC
  Administered 2020-07-05 – 2020-07-06 (×3): 11 [oz_av] via ORAL
  Filled 2020-07-05 (×4): qty 330

## 2020-07-05 MED ORDER — LISINOPRIL 10 MG PO TABS
10.0000 mg | ORAL_TABLET | Freq: Every day | ORAL | Status: DC
  Administered 2020-07-05 – 2020-07-06 (×2): 10 mg via ORAL
  Filled 2020-07-05 (×2): qty 1

## 2020-07-05 NOTE — Progress Notes (Signed)
PT Cancellation Note  Patient Details Name: Evan Jacobs MRN: [id number] DOB: 1950-12-23   Cancelled Treatment:    Reason Eval/Treat Not Completed: Medical issues which prohibited therapy.  Pt is vomiting and hiccupping, will re-attempt at another time.   Ivar Drape 07/05/2020, 11:32 AM   Samul Dada, PT MS Acute Rehab Dept. Number: [facility] [id number] and [facility] [phone]

## 2020-07-05 NOTE — Plan of Care (Signed)

## 2020-07-05 NOTE — Progress Notes (Signed)
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  On presentation he was also complaining of shortness of breath.  Stroke was suspected on presentation and code stroke was called.  He was hypotensive on presentation, severely hyponatremic.  Currently he is hemodynamically stable.  He has been transferred to [facility] service from [facility] service on 07/05/20.  PT/OT recommended skilled nursing facility.   Assessment & Plan:   Active Problems:   AMS (altered mental status)   Acute encephalopathy   Malnutrition of moderate degree   Altered mental status: Suspected to have a stroke on presentation.  Stroke work-up completed.  MRI did not show any evidence of a stroke, CTA head and neck did not show any large vessel occlusion or stenosis.  Mental status has significantly improved but he is disoriented to place.  Follows commands and communicates well.  Acute respiratory failure with hypoxia/aspiration pneumonia: Had to be intubated for the protection of airway due to altered mental status and aspiration.  He self extubated on 11/4.   On room air. He was treated with Unasyn.  Chest x-ray showed improvement in the infiltrates.  Hyponatremia: Significantly improved now.  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  Hyponatremic on presentation which improved with IV fluids.  Hypertensive urgency: Remains severely hypertensive.  We will continue to monitor blood pressure and titrate medications.  Currently on amlodipine and metoprolol.  Added lisinopril.  Chronic ethanol abuse/alcohol withdrawal seizures: Counseled for cessation.  Continue thiamine and folic acid.  On Keppra  for seizure prophylaxis.  Chronic normocytic anemia: Currently hemoglobin stable.  Continue to monitor.  History of chronic hiccups: On Thorazine at home.  Electrolyte abnormalities: Continue to monitor and supplement.  Coffee-ground emesis: Happened while on ICU service.  Suspected alcoholic gastritis.  On PPI.  Moderate protein calorie moderation: Dietitian following.  Debility/deconditioning: Evan Jacobs seen by PT and recommended skilled nursing facility on discharge.  TOC consulted.  Nutrition Problem: Moderate Malnutrition Etiology: chronic illness      DVT prophylaxis: Lovenox Code Status: Full Family Communication: None at bedside Status is: Inpatient  Remains inpatient appropriate because:Inpatient level of care appropriate due to severity of illness   Dispo: The Evan Jacobs is from: Home              Anticipated d/c is to: SNF              Anticipated d/c date is: 2 days              Evan Jacobs currently is not stable for discharge due to severe hypertension    Consultants: pCCm  Procedures:Intubation  Antimicrobials:  Anti-infectives (From admission, onward)   Start     Dose/Rate Route Frequency Ordered Stop   06/30/20 1630  Ampicillin-Sulbactam (UNASYN) 3 g in sodium chloride 0.9 % 100 mL IVPB  Status:  Discontinued        3 g 200 mL/hr over 30 Minutes Intravenous Every 6 hours 06/30/20 1622 07/02/20 1120      Subjective: Evan Jacobs seen and examined at the bedside this morning.  Hypotensive this morning.  Looks comfortable during my evaluation.  Alert, awake, communicates very well, oriented to place only.  Follows commands.  Denies any complaints.  Was hiccuping during my evaluation  Objective: Vitals:   07/04/20 2124 07/04/20 2328 07/05/20 0349 07/05/20 0518  BP: (!) 204/88 (!) 190/90 (!) 183/84 (!) 192/78  Pulse: (!) 103 (!) 116 90 93  Resp:    18  Temp: 99.7 F (37.6 C)  99 F (37.2 C) 98.8 F (37.1 C)  TempSrc: Oral  Oral Oral  SpO2: 99%  97% 92%    Weight:      Height:        Intake/Output Summary (Last 24 hours) at 07/05/2020 0845 Last data filed at 07/05/2020 0356 Gross per 24 hour  Intake --  Output 1620 ml  Net -1620 ml   Filed Weights   07/02/20 0500 07/03/20 0457 07/04/20 0500  Weight: 69.4 kg 70.5 kg 70.7 kg    Examination:  General exam: Appears calm and comfortable ,Not in distress, having hiccups  HEENT:PERRL,Oral mucosa moist, Ear/Nose normal on gross exam Respiratory system: Bilateral equal air entry, normal vesicular breath sounds, no wheezes or crackles  Cardiovascular system: S1 & S2 heard, RRR. No JVD, murmurs, rubs, gallops or clicks. No pedal edema. Gastrointestinal system: Abdomen is nondistended, soft and nontender. No organomegaly or masses felt. Normal bowel sounds heard. Central nervous system: Alert and awake, oriented to place only. No focal neurological deficits. Extremities: No edema, no clubbing ,no cyanosis Skin: No rashes, lesions or ulcers,no icterus ,no pallor    Data Reviewed: I have personally reviewed following labs and imaging studies  CBC: Recent Labs  Lab 06/30/20 0931 06/30/20 0932 07/01/20 1700 07/02/20 0458 07/03/20 0438 07/04/20 0349 07/05/20 0303  WBC 8.1   < > 6.4 6.4 4.5 5.5 6.6  NEUTROABS 5.5  --   --   --   --   --   --   HGB 11.6*   < > 10.7* 9.7* 10.1* 10.6* 10.4*  HCT 36.0*   < > 32.7* 28.8* 31.0* 33.9* 32.3*  MCV 81.4   < > 79.8* 80.2 80.9 82.9 81.6  PLT 130*   < > 132* 121* 119* 168 190   < > = values in this interval not displayed.   Basic Metabolic Panel: Recent Labs  Lab 07/01/20 0808 07/01/20 1155 07/01/20 1700 07/01/20 1830 07/02/20 0458 07/02/20 0645 07/02/20 0851 07/02/20 1115 07/02/20 1700 07/02/20 2015 07/03/20 0242 07/03/20 0438 07/03/20 1745 07/04/20 0349 07/05/20 0303  NA 125*   < > 125*   < > 125*   < > 127*   < >  --    < > 135 133* 135 136 132*  K 3.7   < >  --   --   --   --  3.0*  --   --   --   --  3.2* 3.9 4.7 3.8  CL 90*   --   --   --   --   --   --   --   --   --   --  99 100 102 97*  CO2 24  --   --   --   --   --   --   --   --   --   --  25 26 23 25   GLUCOSE 99  --   --   --   --   --   --   --   --   --   --  156* 122* 91 96  BUN 5*  --   --   --   --   --   --   --   --   --   --  8 10 10  6*  CREATININE 1.06  --   --   --   --   --   --   --   --   --   --  0.79 0.97 0.78 0.78  CALCIUM 7.9*  --   --   --   --   --   --   --   --   --   --  8.6* 8.9 9.1 9.3  MG 3.1*   < > 2.1  --  2.2  --   --   --  2.0  --   --  1.8  --  1.8  --   PHOS 3.3  --  3.5  --  3.6  --   --   --  3.8  --   --   --   --  4.6  --    < > = values in this interval not displayed.   GFR: Estimated Creatinine Clearance: 76.4 mL/min (by C-G formula based on SCr of 0.78 mg/dL). Liver Function Tests: Recent Labs  Lab 06/30/20 0931 07/01/20 0808  AST 29 49*  ALT 12 17  ALKPHOS 64 58  BILITOT 1.2 1.1  PROT 7.1 6.4*  ALBUMIN 4.0 3.3*   No results for input(s): LIPASE, AMYLASE in the last 168 hours. No results for input(s): AMMONIA in the last 168 hours. Coagulation Profile: Recent Labs  Lab 06/30/20 0931  INR 1.1   Cardiac Enzymes: No results for input(s): CKTOTAL, CKMB, CKMBINDEX, TROPONINI in the last 168 hours. BNP (last 3 results) No results for input(s): PROBNP in the last 8760 hours. HbA1C: No results for input(s): HGBA1C in the last 72 hours. CBG: Recent Labs  Lab 07/04/20 1603 07/04/20 1924 07/04/20 2344 07/05/20 0302 07/05/20 0726  GLUCAP 92 124* 94 95 96   Lipid Profile: No results for input(s): CHOL, HDL, LDLCALC, TRIG, CHOLHDL, LDLDIRECT in the last 72 hours. Thyroid Function Tests: No results for input(s): TSH, T4TOTAL, FREET4, T3FREE, THYROIDAB in the last 72 hours. Anemia Panel: No results for input(s): VITAMINB12, FOLATE, FERRITIN, TIBC, IRON, RETICCTPCT in the last 72 hours. Sepsis Labs: No results for input(s): PROCALCITON, LATICACIDVEN in the last 168 hours.  Recent Results (from the past  240 hour(s))  Respiratory Panel by RT PCR (Flu A&B, Covid) - Nasopharyngeal Swab     Status: None   Collection Time: 06/30/20 10:13 AM   Specimen: Nasopharyngeal Swab  Result Value Ref Range Status   SARS Coronavirus 2 by RT PCR NEGATIVE NEGATIVE Final    Comment: (NOTE) SARS-CoV-2 target nucleic acids are NOT DETECTED.  The SARS-CoV-2 RNA is generally detectable in upper respiratoy specimens during the acute phase of infection. The lowest concentration of SARS-CoV-2 viral copies this assay can detect is 131 copies/mL. A negative result does not preclude SARS-Cov-2 infection and should not be used as the sole basis for treatment or other Evan Jacobs management decisions. A negative result may occur with  improper specimen collection/handling, submission of specimen other than nasopharyngeal swab, presence of viral mutation(s) within the areas targeted by this assay, and inadequate number of viral copies (<131 copies/mL). A negative result must be combined with clinical observations, Evan Jacobs history, and epidemiological information. The expected result is Negative.  Fact Sheet for Patients:  07/02/20  Fact Sheet for Healthcare Providers:  https://www.moore.com/  This test is no t yet approved or cleared by the https://www.young.biz/ FDA and  has been authorized for detection and/or diagnosis of SARS-CoV-2 by FDA under  an Emergency Use Authorization (EUA). This EUA will remain  in effect (meaning this test can be used) for the duration of the COVID-19 declaration under Section 564(b)(1) of the Act, 21 U.S.C. section 360bbb-3(b)(1), unless the authorization is terminated or revoked sooner.     Influenza A by PCR NEGATIVE NEGATIVE Final   Influenza B by PCR NEGATIVE NEGATIVE Final    Comment: (NOTE) The Xpert Xpress SARS-CoV-2/FLU/RSV assay is intended as an aid in  the diagnosis of influenza from Nasopharyngeal swab specimens and  should  not be used as a sole basis for treatment. Nasal washings and  aspirates are unacceptable for Xpert Xpress SARS-CoV-2/FLU/RSV  testing.  Fact Sheet for Patients: https://www.moore.com/  Fact Sheet for Healthcare Providers: https://www.young.biz/  This test is not yet approved or cleared by the [place] FDA and  has been authorized for detection and/or diagnosis of SARS-CoV-2 by  FDA under an Emergency Use Authorization (EUA). This EUA will remain  in effect (meaning this test can be used) for the duration of the  Covid-19 declaration under Section 564(b)(1) of the Act, 21  U.S.C. section 360bbb-3(b)(1), unless the authorization is  terminated or revoked. Performed at [facility], [address] [place], [place] [address]   MRSA PCR Screening     Status: None   Collection Time: 06/30/20  5:29 PM   Specimen: Nasal Mucosa; Nasopharyngeal  Result Value Ref Range Status   MRSA by PCR NEGATIVE NEGATIVE Final    Comment:        The GeneXpert MRSA Assay (FDA approved for NASAL specimens only), is one component of a comprehensive MRSA colonization surveillance program. It is not intended to diagnose MRSA infection nor to guide or monitor treatment for MRSA infections. Performed at [facility], [address] [place], [place] [address]          Radiology Studies: No results found.      Scheduled Meds: . amLODipine  10 mg Oral Daily  . Chlorhexidine Gluconate Cloth  6 each Topical Daily  . chlorproMAZINE  25 mg Oral TID  . docusate sodium  100 mg Oral Daily  . enoxaparin (LOVENOX) injection  40 mg Subcutaneous Q24H  . folic acid  1 mg Oral Daily  . levETIRAcetam  1,000 mg Oral BID  . metoprolol tartrate  50 mg Oral BID  . multivitamin  15 mL Oral Daily  . pantoprazole  40 mg Oral BID AC  . thiamine  100 mg Oral Daily   Continuous Infusions:   LOS: 5 days    Time spent: 35 mins,More than 50% of that  time was spent in counseling and/or coordination of care.      Burnadette Pop, MD [facility] P11/12/2019, 8:45 AM

## 2020-07-05 NOTE — Progress Notes (Signed)
Physical Therapy Treatment Patient Details Name: Evan Jacobs MRN: [id number] DOB: [birth year] Today's Date: 07/05/2020    History of Present Illness 69 year old male w/ sig h/o heavy ETOH abuse and chronic hyponatremia (120s and felt w/ h/o beer potomania). Was to be admitted w/working dx of hypertensive crisis, symptomatic hyponatremia (114) and new left sided weakness and acute encephalopathy w/ concern for seizure. Shortly after being treated w/ ativan for his seizure had large volume emesis. Was intubated for airway protection on 10/31. Pt self extubated on 11/3.    PT Comments    Pt was seen earlier to attempt therapy but was vomiting, and now is lethargic from meds to manage symptoms.  He is also coming from not having slept last night.  Pt was assisted to exercise BLE's with good performance, tolerating full ROM with light resistance.  Pt is moving well despite limits of earlier illness and his meds which have made him sleepy.  Follow acutely for goals of PT and focus on his deficits of strength, endurance and balance.  Pt is agreeable to attempt.   Follow Up Recommendations  SNF     Equipment Recommendations  None recommended by PT    Recommendations for Other Services       Precautions / Restrictions Precautions Precautions: Fall Restrictions Weight Bearing Restrictions: No    Mobility  Bed Mobility Overal bed mobility: Needs Assistance Bed Mobility: Supine to Sit;Sit to Supine     Supine to sit: Supervision Sit to supine: Supervision   General bed mobility comments: declined OOB  Transfers Overall transfer level: Needs assistance Equipment used: Rolling walker (2 wheeled) Transfers: Sit to/from Stand Sit to Stand: Min assist         General transfer comment: min assist to power up and steady, cueing for hand placement   Ambulation/Gait                 Stairs             Wheelchair Mobility    Modified Rankin (Stroke Patients Only)        Balance Overall balance assessment: Needs assistance Sitting-balance support: No upper extremity supported;Feet supported Sitting balance-Leahy Scale: Good Sitting balance - Comments: dynamically reaching to adjust socks with supervision    Standing balance support: No upper extremity supported;Bilateral upper extremity supported;During functional activity Standing balance-Leahy Scale: Poor Standing balance comment: relies on UE and external support                            Cognition Arousal/Alertness: Lethargic Behavior During Therapy: Flat affect Overall Cognitive Status: No family/caregiver present to determine baseline cognitive functioning Area of Impairment: Problem solving;Awareness;Following commands                 Orientation Level: Place;Time Current Attention Level: Selective Memory: Decreased short-term memory Following Commands: Follows one step commands inconsistently;Follows one step commands with increased time Safety/Judgement: Decreased awareness of deficits;Decreased awareness of safety Awareness: Intellectual Problem Solving: Slow processing;Requires verbal cues;Requires tactile cues General Comments: re-oriented to time and place, follows simple commands with increased time but poor awareness to safety, deficits and poor problem solving       Exercises General Exercises - Lower Extremity Ankle Circles/Pumps: AAROM;5 reps Quad Sets: AROM;AAROM;10 reps Heel Slides: AAROM;10 reps Hip ABduction/ADduction: AAROM;10 reps Straight Leg Raises: AAROM;10 reps    General Comments General comments (skin integrity, edema, etc.): Pt was quite lethargic, had meds and  was unable to sleep last night.  Pt followed verbal and tactile cues with repetition only      Pertinent Vitals/Pain Pain Assessment: No/denies pain    Home Living Family/patient expects to be discharged to:: Private residence Living Arrangements: Alone Available Help at  Discharge: Friend(s);Available PRN/intermittently Type of Home: Apartment Home Access: Stairs to enter Entrance Stairs-Rails: Right;Left Home Layout: One level Home Equipment: Cane - single point;Walker - 2 wheels;Grab bars - tub/shower Additional Comments: pt reports spending a lot of time with his SO     Prior Function Level of Independence: Needs assistance  Gait / Transfers Assistance Needed: pt ambulates with use of cane ADL's / Homemaking Assistance Needed: independent ADLs, limited IADLs, no driving  Comments: reports signficant other assist with IADls as needed   PT Goals (current goals can now be found in the care plan section) Acute Rehab PT Goals Patient Stated Goal: to get some rest     Frequency    Min 3X/week      PT Plan Current plan remains appropriate    Co-evaluation              AM-PAC PT "6 Clicks" Mobility   Outcome Measure  Help needed turning from your back to your side while in a flat bed without using bedrails?: A Little Help needed moving from lying on your back to sitting on the side of a flat bed without using bedrails?: A Little Help needed moving to and from a bed to a chair (including a wheelchair)?: A Little Help needed standing up from a chair using your arms (e.g., wheelchair or bedside chair)?: A Little Help needed to walk in hospital room?: A Little Help needed climbing 3-5 steps with a railing? : Total 6 Click Score: 16    End of Session   Activity Tolerance: Patient tolerated treatment well Patient left: in bed;with call bell/phone within reach;with bed alarm set Nurse Communication: Mobility status PT Visit Diagnosis: Unsteadiness on feet (R26.81);Other abnormalities of gait and mobility (R26.89)     Time: 4540-9811 PT Time Calculation (min) (ACUTE ONLY): 14 min  Charges:                      Ivar Drape 07/05/2020, 5:13 PM  Samul Dada, PT MS Acute Rehab Dept. Number: [facility] [id number] and [facility] [phone]

## 2020-07-05 NOTE — Progress Notes (Signed)
Nutrition Follow-up  DOCUMENTATION CODES:   Non-severe (moderate) malnutrition in context of chronic illness  INTERVENTION:  Ensure Max po BID, each supplement provides 150 kcal and 30 grams of protein  NUTRITION DIAGNOSIS:   Moderate Malnutrition related to chronic illness as evidenced by mild muscle depletion, mild fat depletion, moderate fat depletion.  ongoing  GOAL:   Patient will meet greater than or equal to 90% of their needs  progressing  MONITOR:   TF tolerance, Vent status, Labs, Weight trends  REASON FOR ASSESSMENT:   Consult, Ventilator Enteral/tube feeding initiation and management  ASSESSMENT:   69 yo male admitted with acute respiratory failure, acute encephalopathy with hyponatremia requiring intubation. PMH includes chronic EtOH abuse, seizures, chronic hyponatremia (120-130)  Pt self-extubated yesterday and has been doing well; pt A/O and was transferred out of the ICU. Pt passed BSE and was initiated on a carb modified diet. Pt reports appetite has been great and that he has been eating 100%. RN confirms. Will order Ensure Max to provide pt with additional kcals/protein given he is malnourished.   UOP: x24 hours  Admit wt: 70 kg Current wt: 70.7 kg  Labs: 132 (L), CBGs [phone] Medications: colace, folvite, mvi, protonix, thiamine  Diet Order:   Diet Order            Diet Carb Modified Fluid consistency: Thin; Room service appropriate? Yes  Diet effective now                 EDUCATION NEEDS:   Not appropriate for education at this time  Skin:  Skin Assessment: Reviewed RN Assessment  Last BM:  11/4  Height:   Ht Readings from Last 1 Encounters:  06/30/20 5\' 6"  (1.676 m)    Weight:   Wt Readings from Last 1 Encounters:  07/04/20 70.7 kg    BMI:  Body mass index is 25.16 kg/m.  Estimated Nutritional Needs:   Kcal:  1750-2070 kcals  Protein:  85-105 g  Fluid:  >/= 1.8 L    13/04/21, MS, RD, LDN RD  pager number and weekend/on-call pager number located in [place].

## 2020-07-05 NOTE — Evaluation (Addendum)
Occupational Therapy Evaluation Patient Details Name: Evan Jacobs MRN: [id number] DOB: 1950-11-21 Today's Date: 07/05/2020    History of Present Illness 69 year old male w/ sig h/o heavy ETOH abuse and chronic hyponatremia (120s and felt w/ h/o beer potomania). Was to be admitted w/working dx of hypertensive crisis, symptomatic hyponatremia (114) and new left sided weakness and acute encephalopathy w/ concern for seizure. Shortly after being treated w/ ativan for his seizure had large volume emesis. Was intubated for airway protection on 10/31. Pt self extubated on 11/3.   Clinical Impression   PTA patient reports independent with ADLs, limited IADLs, not driving. Admitted for above and limited by problem list below, including impaired cognition, generalized weakness, impaired balance and decreased activity tolerance. Patient currently completing bed mobility with supervision, transfers with min assist, ADLs with min assist to supervision.  He is oriented to self only, follows simple 1 step commands but difficulty with multiple step commands, problem solving and awareness to safety; noted lethargic today, reports 'not sleeping last night'- therefore limited eval as patient laying back down quickly after minimal EOB activity.  Patient will benefit from continued OT services while admitted and after dc at SNF level to optimize independence, safety and return to PLOF with ADLs, mobility.     Follow Up Recommendations  SNF;Supervision/Assistance - 24 hour    Equipment Recommendations  3 in 1 bedside commode    Recommendations for Other Services       Precautions / Restrictions Precautions Precautions: Fall Restrictions Weight Bearing Restrictions: No      Mobility Bed Mobility Overal bed mobility: Needs Assistance Bed Mobility: Supine to Sit;Sit to Supine     Supine to sit: Supervision Sit to supine: Supervision   General bed mobility comments: increased time and effort, no  physical assist required    Transfers Overall transfer level: Needs assistance Equipment used: Rolling walker (2 wheeled) Transfers: Sit to/from Stand Sit to Stand: Min assist         General transfer comment: min assist to power up and steady, cueing for hand placement     Balance Overall balance assessment: Needs assistance Sitting-balance support: No upper extremity supported;Feet supported Sitting balance-Leahy Scale: Good Sitting balance - Comments: dynamically reaching to adjust socks with supervision    Standing balance support: No upper extremity supported;Bilateral upper extremity supported;During functional activity Standing balance-Leahy Scale: Poor Standing balance comment: relies on UE and external support                           ADL either performed or assessed with clinical judgement   ADL Overall ADL's : Needs assistance/impaired     Grooming: Set up;Sitting;Supervision/safety   Upper Body Bathing: Set up;Supervision/ safety;Sitting   Lower Body Bathing: Minimal assistance;Sit to/from stand   Upper Body Dressing : Set up;Supervision/safety;Sitting   Lower Body Dressing: Minimal assistance;Sit to/from Market researcher Details (indicate cue type and reason): min assist for simulated side stepping/marching at EOB          Functional mobility during ADLs: Minimal assistance;Min guard;Rolling walker;Cueing for safety;Cueing for sequencing General ADL Comments: pt limited by weakness, impaired balance, decreased activity tolerance and cognition      Vision         Perception     Praxis      Pertinent Vitals/Pain Pain Assessment: No/denies pain     Hand Dominance Right   Extremity/Trunk Assessment Upper Extremity Assessment Upper  Extremity Assessment: Generalized weakness (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 )   Lower Extremity Assessment Lower Extremity Assessment: Defer to PT evaluation   Cervical  / Trunk Assessment Cervical / Trunk Assessment: Normal   Communication Communication Communication: No difficulties   Cognition Arousal/Alertness: Lethargic Behavior During Therapy: WFL for tasks assessed/performed Overall Cognitive Status: No family/caregiver present to determine baseline cognitive functioning Area of Impairment: Orientation;Memory;Safety/judgement;Awareness;Following commands;Problem solving;Attention                 Orientation Level: Disoriented to;Place;Time Current Attention Level: Sustained Memory: Decreased recall of precautions;Decreased short-term memory Following Commands: Follows one step commands consistently;Follows one step commands with increased time Safety/Judgement: Decreased awareness of safety;Decreased awareness of deficits Awareness: Intellectual Problem Solving: Slow processing;Requires verbal cues General Comments: re-oriented to time and place, follows simple commands with increased time but poor awareness to safety, deficits and poor problem solving    General Comments  HR ranged from 90-100    Exercises     Shoulder Instructions      Home Living Family/patient expects to be discharged to:: Private residence Living Arrangements: Alone Available Help at Discharge: Friend(s);Available PRN/intermittently Type of Home: Apartment Home Access: Stairs to enter Entrance Stairs-Number of Steps: 3 Entrance Stairs-Rails: Right;Left Home Layout: One level     Bathroom Shower/Tub: Chief Strategy Officer: Standard     Home Equipment: Cane - single point;Walker - 2 wheels;Grab bars - tub/shower   Additional Comments: pt reports spending a lot of time with his SO       Prior Functioning/Environment Level of Independence: Needs assistance  Gait / Transfers Assistance Needed: pt ambulates with use of cane ADL's / Homemaking Assistance Needed: independent ADLs, limited IADLs, no driving    Comments: reports signficant  other assist with IADls as needed        OT Problem List: Decreased strength;Decreased activity tolerance;Impaired balance (sitting and/or standing);Decreased cognition;Decreased safety awareness;Decreased knowledge of use of DME or AE;Decreased knowledge of precautions      OT Treatment/Interventions: Self-care/ADL training;DME and/or AE instruction;Therapeutic activities;Patient/family education;Balance training;Cognitive remediation/compensation;Therapeutic exercise    OT Goals(Current goals can be found in the care plan section) Acute Rehab OT Goals Patient Stated Goal: to get some rest  OT Goal Formulation: With patient Time For Goal Achievement: 07/19/20 Potential to Achieve Goals: Good  OT Frequency: Min 2X/week   Barriers to D/C:            Co-evaluation              AM-PAC OT "6 Clicks" Daily Activity     Outcome Measure Help from another person eating meals?: A Little Help from another person taking care of personal grooming?: A Little Help from another person toileting, which includes using toliet, bedpan, or urinal?: A Little Help from another person bathing (including washing, rinsing, drying)?: A Little Help from another person to put on and taking off regular upper body clothing?: A Little Help from another person to put on and taking off regular lower body clothing?: A Little 6 Click Score: 18   End of Session Equipment Utilized During Treatment: Gait belt;Rolling walker Nurse Communication: Mobility status  Activity Tolerance: Patient limited by fatigue Patient left: in bed;with call bell/phone within reach;with bed alarm set  OT Visit Diagnosis: Other abnormalities of gait and mobility (R26.89);Muscle weakness (generalized) (M62.81);Other symptoms and signs involving cognitive function  Time: 1610-9604 OT Time Calculation (min): 14 min Charges:  OT General Charges $OT Visit: 1 Visit OT Evaluation $OT Eval Moderate Complexity: 1  Mod  Barry Brunner, OT Acute Rehabilitation Services Pager [phone] Office [phone]   Chancy Milroy 07/05/2020, 2:47 PM

## 2020-07-05 NOTE — Care Management Important Message (Signed)
Important Message  Patient Details  Name: Renne Platts MRN: [id number] Date of Birth: [birth year]   Medicare Important Message Given:  Yes     Kataleya Zaugg 07/05/2020, 2:02 PM

## 2020-07-06 LAB — GLUCOSE, CAPILLARY
Glucose-Capillary: 94 mg/dL (ref 70–99)
Glucose-Capillary: 125 mg/dL — ABNORMAL HIGH (ref 70–99)
Glucose-Capillary: 126 mg/dL — ABNORMAL HIGH (ref 70–99)

## 2020-07-06 MED ORDER — POLYSACCHARIDE IRON COMPLEX 150 MG PO CAPS: 150.0000 mg | ORAL_CAPSULE | Freq: Every day | ORAL | 2 refills | Status: AC

## 2020-07-06 MED ORDER — THIAMINE HCL 100 MG PO TABS: 100.0000 mg | ORAL_TABLET | Freq: Every day | ORAL | 2 refills | Status: AC

## 2020-07-06 MED ORDER — LISINOPRIL 5 MG PO TABS
5.0000 mg | ORAL_TABLET | Freq: Every day | ORAL | 1 refills | Status: DC
Start: 2020-07-07 — End: 2022-09-03

## 2020-07-06 MED ORDER — AMLODIPINE BESYLATE 10 MG PO TABS
10.0000 mg | ORAL_TABLET | Freq: Every day | ORAL | 1 refills | Status: DC
Start: 2020-07-07 — End: 2022-09-03

## 2020-07-06 MED ORDER — METOPROLOL TARTRATE 50 MG PO TABS: 50.0000 mg | ORAL_TABLET | Freq: Two times a day (BID) | ORAL | 1 refills | Status: DC

## 2020-07-06 MED ORDER — PANTOPRAZOLE SODIUM 40 MG PO TBEC
40.0000 mg | DELAYED_RELEASE_TABLET | Freq: Two times a day (BID) | ORAL | 1 refills | Status: DC
Start: 2020-07-06 — End: 2020-07-06

## 2020-07-06 MED ORDER — PANTOPRAZOLE SODIUM 40 MG PO TBEC
40.0000 mg | DELAYED_RELEASE_TABLET | Freq: Every day | ORAL | 1 refills | Status: DC
Start: 2020-07-06 — End: 2022-09-03

## 2020-07-06 NOTE — TOC Transition Note (Signed)
Transition of Care [facility]) - CM/SW Discharge Note   Patient Details  Name: Muhannad Bignell MRN: [id number] Date of Birth: 07-19-1951  Transition of Care [facility]) CM/SW Contact:  Lawerance Sabal, RN Phone Number: 07/06/2020, 1:19 PM   Clinical Narrative:   Sherron Monday w patient re PCP. He states that he has a PCP on [address], but could not remember the name of the practice. Spoke w his S.O. [name], with his permission, She states the first name ws Reuel Boom and the last started w N sounded like [place]. Google results that [facility] on [address] in [place] staffs Robyne Peers NP.           Patient Goals and CMS Choice        Discharge Placement                       Discharge Plan and Services                                     Social Determinants of Health (SDOH) Interventions     Readmission Risk Interventions No flowsheet data found.

## 2020-07-06 NOTE — Discharge Summary (Signed)
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  On presentation he was also complaining of shortness of breath.  Stroke was suspected on presentation and code stroke was called.  He was hypotensive on presentation, severely hyponatremic.  Currently he is hemodynamically stable.  He has been transferred to [facility] service from [facility] service on 07/05/20.    He has remained hemodynamically stable for last 48 hours.  His mental status has improved.  PT recommended no follow-up today.  Medically stable for discharge to home today.  Following problems were addressed during his hospitalization:   Altered mental status: Suspected to have a stroke on presentation.  Stroke work-up completed.  MRI did not show any evidence of a stroke, CTA head and neck did not show any large vessel occlusion or stenosis.  Mental status has significantly improved but not that oriented to time. Follows commands and communicates well.  Acute respiratory failure with hypoxia/aspiration pneumonia: Had to be intubated for the protection of airway due to altered mental status and aspiration.  He self extubated on 11/4.   On room air. He was treated with Unasyn.  Chest x-ray showed improvement in the infiltrates.  Hyponatremia: Significantly improved now.  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  Hyponatremic on presentation, which improved with IV fluids.  Hypertensive urgency: Bp improved  .Currently on amlodipine and metoprolol.  Added  lisinopril.  Chronic ethanol abuse/alcohol withdrawal seizures: Counseled for cessation.  Continue thiamine and folic acid.  On Keppra for for history of seizures.  Chronic normocytic anemia: Currently hemoglobin stable.    History of chronic hiccups: On Thorazine at home..  Coffee-ground emesis: Happened while on ICU service.  Suspected alcoholic gastritis.  On PPI.  Moderate protein calorie moderation: Dietitian following.  Debility/deconditioning: Seen by PT today with no follow-up recommendation.  Discharge Diagnoses:  Active Problems:   AMS (altered mental status)   Acute encephalopathy   Malnutrition of moderate degree    Discharge Instructions  Discharge Instructions    Diet - low sodium heart healthy   Complete by: As directed    Discharge instructions   Complete by: As directed    1)Please take prescribed medications as instructed. 2)Follow up with your PCP in a week. 3)Please stop taking alcohol,stop smoking   Increase activity slowly   Complete by: As directed      Allergies as of 07/06/2020      Reactions   Lisinopril Other (See Comments)   Kidney Dysfunction.      Medication List    STOP taking these medications   diltiazem 120 MG 24 hr capsule Commonly known as: TIAZAC     TAKE these medications   amLODipine 10 MG tablet Commonly known as: NORVASC Take 1 tablet (10 mg total) by mouth daily. Start taking on: July 07, 2020   chlorproMAZINE 25 MG tablet Commonly known as: THORAZINE Take 25 mg by mouth 3 (three) times daily.   folic acid 1 MG tablet Commonly known as: FOLVITE Take 1 tablet (1 mg total) by mouth daily.   iron polysaccharides 150 MG capsule Commonly known as: NIFEREX Take 1  capsule (150 mg total) by mouth daily.   levETIRAcetam 1000 MG tablet Commonly known as: KEPPRA Take 1 tablet (1,000 mg total) by mouth 2 (two) times daily.   lisinopril 5 MG tablet Commonly known as: ZESTRIL Take 1 tablet (5 mg total) by  mouth daily. Start taking on: July 07, 2020   Magnesium 250 MG Tabs Take 250 mg by mouth daily.   metoprolol tartrate 50 MG tablet Commonly known as: LOPRESSOR Take 1 tablet (50 mg total) by mouth 2 (two) times daily.   pantoprazole 40 MG tablet Commonly known as: PROTONIX Take 1 tablet (40 mg total) by mouth daily.   sodium chloride 1 g tablet Take 1 g by mouth 3 (three) times daily.   thiamine 100 MG tablet Take 1 tablet (100 mg total) by mouth daily. Start taking on: July 07, 2020       Allergies  Allergen Reactions  . Lisinopril Other (See Comments)    Kidney Dysfunction.     Consultations:  PCCM   Procedures/Studies: CT Code Stroke CTA Head W/WO contrast  Result Date: 06/30/2020 CLINICAL DATA:  Left-sided weakness, code stroke follow-up EXAM: CT ANGIOGRAPHY HEAD AND NECK CT PERFUSION BRAIN TECHNIQUE: Multidetector CT imaging of the head and neck was performed using the standard protocol during bolus administration of intravenous contrast. Multiplanar CT image reconstructions and MIPs were obtained to evaluate the vascular anatomy. Carotid stenosis measurements (when applicable) are obtained utilizing NASCET criteria, using the distal internal carotid diameter as the denominator. Multiphase CT imaging of the brain was performed following IV bolus contrast injection. Subsequent parametric perfusion maps were calculated using RAPID software. CONTRAST:  OMNIPAQUE IOHEXOL 350 MG/ML SOLN COMPARISON:  2019 FINDINGS: CTA NECK Aortic arch: Great vessel origins are patent. Right carotid system: Patent. Mild calcified plaque at the ICA origin without measurable stenosis. Left carotid system: Patent. Minimal calcified plaque at the ICA origin without measurable stenosis. Vertebral arteries: Patent. No measurable stenosis or evidence of dissection. Skeleton: Advanced degenerative changes of the cervical spine at C5-C6 and C6-C7. Chronic thoracic compression fractures.  Other neck: No mass or adenopathy. Upper chest: Esophageal wall thickening and dilatation also seen previously. No apical lung mass. Trace right pleural effusion. Review of the MIP images confirms the above findings CTA HEAD Anterior circulation: Intracranial internal carotid arteries are patent with calcified plaque causing mild stenosis. Anterior and middle cerebral arteries are patent. Posterior circulation: Intracranial vertebral arteries are patent with calcified plaque on the right causing mild stenosis. Basilar artery is patent. 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 Posterior cerebral arteries are patent. Venous sinuses: Patent as allowed by contrast bolus timing. Review of the MIP images confirms the above findings CT Brain Perfusion Findings: CBF (<30%) Volume: 0mL Perfusion (Tmax>6.0s) volume: 0mL Mismatch Volume: 0mL Infarction Location: None. IMPRESSION: No large vessel occlusion or hemodynamically significant stenosis. Perfusion imaging demonstrates no evidence of core infarction or penumbra. Electronically Signed   By: Guadlupe Spanish M.D.   On: 06/30/2020 10:05   CT Code Stroke CTA Neck W/WO contrast  Result Date: 06/30/2020 CLINICAL DATA:  Left-sided weakness, code stroke follow-up EXAM: CT ANGIOGRAPHY HEAD AND NECK CT PERFUSION BRAIN TECHNIQUE: Multidetector CT imaging of the head and neck was performed using the standard protocol during bolus administration of intravenous contrast. Multiplanar CT image reconstructions and MIPs were obtained to evaluate the vascular anatomy. Carotid stenosis measurements (when applicable) are obtained utilizing NASCET criteria, using the distal internal carotid diameter as the denominator. Multiphase CT imaging of the brain  was performed following IV bolus contrast injection. Subsequent parametric perfusion maps were calculated using RAPID software. CONTRAST:  OMNIPAQUE IOHEXOL 350 MG/ML SOLN COMPARISON:  2019 FINDINGS: CTA NECK Aortic arch: Great vessel  origins are patent. Right carotid system: Patent. Mild calcified plaque at the ICA origin without measurable stenosis. Left carotid system: Patent. Minimal calcified plaque at the ICA origin without measurable stenosis. Vertebral arteries: Patent. No measurable stenosis or evidence of dissection. Skeleton: Advanced degenerative changes of the cervical spine at C5-C6 and C6-C7. Chronic thoracic compression fractures. Other neck: No mass or adenopathy. Upper chest: Esophageal wall thickening and dilatation also seen previously. No apical lung mass. Trace right pleural effusion. Review of the MIP images confirms the above findings CTA HEAD Anterior circulation: Intracranial internal carotid arteries are patent with calcified plaque causing mild stenosis. Anterior and middle cerebral arteries are patent. Posterior circulation: Intracranial vertebral arteries are patent with calcified plaque on the right causing mild stenosis. Basilar artery is patent. 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 Posterior cerebral arteries are patent. Venous sinuses: Patent as allowed by contrast bolus timing. Review of the MIP images confirms the above findings CT Brain Perfusion Findings: CBF (<30%) Volume: 27mL Perfusion (Tmax>6.0s) volume: 48mL Mismatch Volume: 84mL Infarction Location: None. IMPRESSION: No large vessel occlusion or hemodynamically significant stenosis. Perfusion imaging demonstrates no evidence of core infarction or penumbra. Electronically Signed   By: Guadlupe Spanish M.D.   On: 06/30/2020 10:05   MR BRAIN WO CONTRAST  Result Date: 07/02/2020 CLINICAL DATA:  Stroke, follow-up EXAM: MRI HEAD WITHOUT CONTRAST TECHNIQUE: Multiplanar, multiecho pulse sequences of the brain and surrounding structures were obtained without intravenous contrast. COMPARISON:  December 2020 FINDINGS: Motion artifact is present. Brain: There is no acute infarction or intracranial hemorrhage. There is no intracranial mass, mass effect, or edema.  There is no hydrocephalus or extra-axial fluid collection. Prominence of the ventricles and sulci reflects generalized parenchymal volume loss. Small chronic right frontal infarct. Patchy T2 hyperintensity in the supratentorial white matter is nonspecific but likely reflects stable chronic microvascular ischemic changes. Vascular: Major vessel flow voids at the skull base are preserved. Skull and upper cervical spine: Normal marrow signal is preserved. Sinuses/Orbits: Opacified right maxillary sinus. Partially opacified left frontal sinus. Mild ethmoid mucosal thickening. Orbits are unremarkable. Other: Sella is unremarkable. Mild patchy mastoid fluid opacification. IMPRESSION: No acute infarction, hemorrhage, or mass. Chronic right maxillary sinus. Superimposed acute sinusitis is possible in the appropriate clinical setting. Electronically Signed   By: Guadlupe Spanish M.D.   On: 07/02/2020 14:42   CT Code Stroke Cerebral Perfusion with contrast  Result Date: 06/30/2020 CLINICAL DATA:  Left-sided weakness, code stroke follow-up EXAM: CT ANGIOGRAPHY HEAD AND NECK CT PERFUSION BRAIN TECHNIQUE: Multidetector CT imaging of the head and neck was performed using the standard protocol during bolus administration of intravenous contrast. Multiplanar CT image reconstructions and MIPs were obtained to evaluate the vascular anatomy. Carotid stenosis measurements (when applicable) are obtained utilizing NASCET criteria, using the distal internal carotid diameter as the denominator. Multiphase CT imaging of the brain was performed following IV bolus contrast injection. Subsequent parametric perfusion maps were calculated using RAPID software. CONTRAST:  OMNIPAQUE IOHEXOL 350 MG/ML SOLN COMPARISON:  2019 FINDINGS: CTA NECK Aortic arch: Great vessel origins are patent. Right carotid system: Patent. Mild calcified plaque at the ICA origin without measurable stenosis. Left carotid system: Patent. Minimal calcified plaque  at the ICA origin without measurable stenosis. Vertebral arteries: Patent. No measurable stenosis or evidence of dissection.  Skeleton: Advanced degenerative changes of the cervical spine at C5-C6 and C6-C7. Chronic thoracic compression fractures. Other neck: No mass or adenopathy. Upper chest: Esophageal wall thickening and dilatation also seen previously. No apical lung mass. Trace right pleural effusion. Review of the MIP images confirms the above findings CTA HEAD Anterior circulation: Intracranial internal carotid arteries are patent with calcified plaque causing mild stenosis. Anterior and middle cerebral arteries are patent. Posterior circulation: Intracranial vertebral arteries are patent with calcified plaque on the right causing mild stenosis. Basilar artery is patent. 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 Posterior cerebral arteries are patent. Venous sinuses: Patent as allowed by contrast bolus timing. Review of the MIP images confirms the above findings CT Brain Perfusion Findings: CBF (<30%) Volume: 0mL Perfusion (Tmax>6.0s) volume: 0mL Mismatch Volume: 0mL Infarction Location: None. IMPRESSION: No large vessel occlusion or hemodynamically significant stenosis. Perfusion imaging demonstrates no evidence of core infarction or penumbra. Electronically Signed   By: Guadlupe Spanish M.D.   On: 06/30/2020 10:05   DG CHEST PORT 1 VIEW  Result Date: 07/02/2020 CLINICAL DATA:  Evaluation for pneumonia. EXAM: PORTABLE CHEST 1 VIEW COMPARISON:  06/30/2020. FINDINGS: Endotracheal tube, NG tube, right IJ line in stable position. Heart size stable. Improvement in aeration in the right perihilar region with mild residual right perihilar right base atelectasis. No pleural effusion or pneumothorax. IMPRESSION: 1. Lines and tubes in stable position. 2. Interim improvement in aeration of the right perihilar region with mild residual right perihilar and right base atelectasis. Electronically Signed   By: Maisie Fus  Register    On: 07/02/2020 05:18   DG Chest Portable 1 View  Result Date: 06/30/2020 CLINICAL DATA:  Evaluate ETT placement. EXAM: PORTABLE CHEST 1 VIEW COMPARISON:  June 30, 2020 FINDINGS: The ETT terminates in good position. The right central line terminates in the central SVC. No pneumothorax. The NG tube terminates within the stomach. Stable cardiomegaly. The hila and mediastinum are unchanged. The left lung remains clear. Mild right perihilar opacity is seen. IMPRESSION: 1. Support apparatus as above. 2. Mild right perihilar opacity. Recommend attention on follow-up. Electronically Signed   By: Gerome Sam III M.D   On: 06/30/2020 14:18   DG Chest Portable 1 View  Result Date: 06/30/2020 CLINICAL DATA:  Chest pain. EXAM: PORTABLE CHEST 1 VIEW COMPARISON:  June 09, 2020 FINDINGS: Stable cardiomegaly. The hila and mediastinum are unchanged. No pneumothorax. No nodules or masses. No focal infiltrates. IMPRESSION: No active disease. Electronically Signed   By: Gerome Sam III M.D   On: 06/30/2020 10:20   EEG adult  Result Date: 06/30/2020 Rejeana Brock, MD     06/30/2020  1:47 PM History: 69 yo M with encephalopathy Sedation: None Technique: This is a 21 channel routine scalp EEG performed at the bedside with bipolar and monopolar montages arranged in accordance to the international 10/20 system of electrode placement. One channel was dedicated to EKG recording. Background: The background is markedly disorganized with high voltage irregular delta and theta range activity. There are periodic discharges with triphasic morphology with a frequency of 2 - 2.5 hz. 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 This pattern is sometimes sharply contoured. Photic stimulation: Physiologic driving is not performed EEG Abnormalities: 1) Generalized periodic discharges with  triphasic morphology 2) Generalized high voltage slow activity. Clinical Interpretation: This EEG is most consistent with a generalized encephalopathy.  The triphasic  waves seen can rarely be epileptic in origin, and therefore there could be consideration to an Ativan trial to further characterize this pattern. Ritta SlotMcNeill Kirkpatrick, MD [facility] [phone] If 7pm- 7am, please page neurology on call as listed in AMION.   CT HEAD CODE STROKE WO CONTRAST  Result Date: 06/30/2020 CLINICAL DATA:  Code stroke.  Left-sided weakness EXAM: CT HEAD WITHOUT CONTRAST TECHNIQUE: Contiguous axial images were obtained from the base of the skull through the vertex without intravenous contrast. COMPARISON:  None. FINDINGS: Brain: No acute intracranial hemorrhage mass effect, or edema. No new loss of gray-white differentiation. Small chronic right frontal infarct. Ventricles are stable in size. No hydrocephalus. Patchy hypoattenuation in the supratentorial white matter is nonspecific but may reflect minor chronic microvascular ischemic changes. No extra-axial collection. Vascular: No hyperdense vessel. There is intracranial atherosclerotic calcification at the skull base Skull: Unremarkable. 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 No acute orbital finding. Other: Mastoid air cells are clear. ASPECTS ([place] Stroke Program Early CT Score) - Ganglionic level infarction (caudate, lentiform nuclei, internal capsule, insula, M1-M3 cortex): 7 - Supraganglionic infarction (M4-M6 cortex): 3 Total score (0-10 with 10 being normal): 10 IMPRESSION: There is no acute intracranial hemorrhage or evidence of acute infarction. ASPECT score is 10. Small chronic right frontal infarct. These results were communicated to Dr. Amada JupiterKirkpatrick at 9:27 am on 06/30/2020 by text page via the [facility] messaging system. Electronically Signed   By: Guadlupe SpanishPraneil  Patel M.D.   On: 06/30/2020 09:32      Subjective: Patient  seen and examined at the bedside this morning.  Medically stable for discharge today.  Discharge Exam: Vitals:   07/06/20 0414 07/06/20 0838  BP: 97/62 108/72  Pulse: 83 82  Resp: 20   Temp: 98.7 F (37.1 C)   SpO2: 99%    Vitals:   07/05/20 1422 07/05/20 2100 07/06/20 0414 07/06/20 0838  BP: 135/77 (!) 142/79 97/62 108/72  Pulse: 90 98 83 82  Resp: 18 18 20    Temp: 99.8 F (37.7 C) 99.5 F (37.5 C) 98.7 F (37.1 C)   TempSrc:  Oral Oral   SpO2: 98% 100% 99%   Weight:      Height:        General: Pt is alert, awake, not in acute distress Cardiovascular: RRR, S1/S2 +, no rubs, no gallops Respiratory: CTA bilaterally, no wheezing, no rhonchi Abdominal: Soft, NT, ND, bowel sounds + Extremities: no edema, no cyanosis    The results of significant diagnostics from this hospitalization (including imaging, microbiology, ancillary and laboratory) are listed below for reference.     Microbiology: Recent Results (from the past 240 hour(s))  Respiratory Panel by RT PCR (Flu A&B, Covid) - Nasopharyngeal Swab     Status: None   Collection Time: 06/30/20 10:13 AM   Specimen: Nasopharyngeal Swab  Result Value Ref Range Status   SARS Coronavirus 2 by RT PCR NEGATIVE NEGATIVE Final    Comment: (NOTE) SARS-CoV-2 target nucleic acids are NOT DETECTED.  The SARS-CoV-2 RNA is generally detectable in upper respiratoy specimens during the acute phase of infection. The lowest concentration of SARS-CoV-2 viral copies this assay can detect is 131 copies/mL. A negative result does not preclude SARS-Cov-2 infection and should not be used as the sole basis for treatment or other patient management decisions. A negative result may occur with  improper specimen collection/handling, submission of specimen other than nasopharyngeal swab, presence of viral mutation(s) within the areas targeted by this assay, and inadequate  number of viral copies (<131 copies/mL). A negative result must be  combined with clinical observations, patient history, and epidemiological information. The expected result is Negative.  Fact Sheet for Patients:  https://www.moore.com/  Fact Sheet for Healthcare Providers:  https://www.young.biz/  This test is no t yet approved or cleared by the [place] FDA and  has been authorized for detection and/or diagnosis of SARS-CoV-2 by FDA under an Emergency Use Authorization (EUA). This EUA will remain  in effect (meaning this test can be used) for the duration of the COVID-19 declaration under Section 564(b)(1) of the Act, 21 U.S.C. section 360bbb-3(b)(1), unless the authorization is terminated or revoked sooner.     Influenza A by PCR NEGATIVE NEGATIVE Final   Influenza B by PCR NEGATIVE NEGATIVE Final    Comment: (NOTE) The Xpert Xpress SARS-CoV-2/FLU/RSV assay is intended as an aid in  the diagnosis of influenza from Nasopharyngeal swab specimens and  should not be used as a sole basis for treatment. Nasal washings and  aspirates are unacceptable for Xpert Xpress SARS-CoV-2/FLU/RSV  testing.  Fact Sheet for Patients: https://www.moore.com/  Fact Sheet for Healthcare Providers: https://www.young.biz/  This test is not yet approved or cleared by the [place] FDA and  has been authorized for detection and/or diagnosis of SARS-CoV-2 by  FDA under an Emergency Use Authorization (EUA). This EUA will remain  in effect (meaning this test can be used) for the duration of the  Covid-19 declaration under Section 564(b)(1) of the Act, 21  U.S.C. section 360bbb-3(b)(1), unless the authorization is  terminated or revoked. Performed at [facility], [address], [place], [place] [address]   MRSA PCR Screening     Status: None   Collection Time: 06/30/20  5:29 PM   Specimen: Nasal Mucosa; Nasopharyngeal  Result Value Ref Range Status   MRSA by PCR NEGATIVE  NEGATIVE Final    Comment:        The GeneXpert MRSA Assay (FDA approved for NASAL specimens only), is one component of a comprehensive MRSA colonization surveillance program. It is not intended to diagnose MRSA infection nor to guide or monitor treatment for MRSA infections. Performed at [facility], [address], [place], [place] [address]   Culture, blood (routine x 2)     Status: None (Preliminary result)   Collection Time: 07/05/20  3:03 AM   Specimen: BLOOD  Result Value Ref Range Status   Specimen Description BLOOD RIGHT ANTECUBITAL  Final   Special Requests   Final    BOTTLES DRAWN AEROBIC AND ANAEROBIC Blood Culture adequate volume   Culture   Final    NO GROWTH 1 DAY Performed at [facility], [address], [place], [place] [address]    Report Status PENDING  Incomplete  Culture, blood (routine x 2)     Status: None (Preliminary result)   Collection Time: 07/05/20  3:04 AM   Specimen: BLOOD RIGHT HAND  Result Value Ref Range Status   Specimen Description BLOOD RIGHT HAND  Final   Special Requests   Final    BOTTLES DRAWN AEROBIC AND ANAEROBIC Blood Culture adequate volume   Culture   Final    NO GROWTH 1 DAY Performed at [facility], [address] [place], [place] [address]    Report Status PENDING  Incomplete     Labs: BNP (last 3 results) No results for input(s): BNP in the last 8760 hours. Basic Metabolic Panel: Recent Labs  Lab 07/01/20 0808 07/01/20 1155 07/01/20 1700 07/01/20 1830 07/02/20 0458 07/02/20 0645 07/02/20 0851 07/02/20 1115 07/02/20 1700 07/02/20 2015 07/03/20 0242 07/03/20 0438 07/03/20 1745 07/04/20 0349 07/05/20 0303  NA 125*   < > 125*   < > 125*   < > 127*   < >  --    < > 135 133* 135 136 132*  K 3.7   < >  --   --   --   --  3.0*  --   --   --   --  3.2* 3.9 4.7 3.8  CL 90*  --   --   --   --   --   --   --   --   --   --  99 100 102 97*  CO2 24  --   --   --   --   --   --   --   --   --   --   25 26 23 25   GLUCOSE 99  --   --   --   --   --   --   --   --   --   --  156* 122* 91 96  BUN 5*  --   --   --   --   --   --   --   --   --   --  8 10 10  6*  CREATININE 1.06  --   --   --   --   --   --   --   --   --   --  0.79 0.97 0.78 0.78  CALCIUM 7.9*  --   --   --   --   --   --   --   --   --   --  8.6* 8.9 9.1 9.3  MG 3.1*   < > 2.1  --  2.2  --   --   --  2.0  --   --  1.8  --  1.8  --   PHOS 3.3  --  3.5  --  3.6  --   --   --  3.8  --   --   --   --  4.6  --    < > = values in this interval not displayed.   Liver Function Tests: Recent Labs  Lab 06/30/20 0931 07/01/20 0808  AST 29 49*  ALT 12 17  ALKPHOS 64 58  BILITOT 1.2 1.1  PROT 7.1 6.4*  ALBUMIN 4.0 3.3*   No results for input(s): LIPASE, AMYLASE in the last 168 hours. No results for input(s): AMMONIA in the last 168 hours. CBC: Recent Labs  Lab 06/30/20 0931 06/30/20 0932 07/01/20 1700 07/02/20 0458 07/03/20 0438 07/04/20 0349 07/05/20 0303  WBC 8.1   < > 6.4 6.4 4.5 5.5 6.6  NEUTROABS 5.5  --   --   --   --   --   --   HGB 11.6*   < > 10.7* 9.7* 10.1* 10.6* 10.4*  HCT 36.0*   < > 32.7* 28.8* 31.0* 33.9* 32.3*  MCV 81.4   < > 79.8* 80.2 80.9 82.9 81.6  PLT 130*   < > 132* 121* 119* 168 190   < > = values in this interval not displayed.   Cardiac Enzymes: No results for input(s): CKTOTAL, CKMB, CKMBINDEX, TROPONINI in the last 168 hours. BNP: Invalid input(s): POCBNP  CBG: Recent Labs  Lab 07/05/20 1918 07/05/20 2317 07/06/20 0312 07/06/20 0748 07/06/20 1141  GLUCAP 99 98 94 125* 126*   D-Dimer No results for input(s): DDIMER in the last 72 hours. Hgb A1c No results for input(s): HGBA1C in the last 72 hours. Lipid Profile No results for input(s): CHOL, HDL, LDLCALC, TRIG, CHOLHDL, LDLDIRECT in the last 72 hours. Thyroid function studies No results for input(s): TSH, T4TOTAL, T3FREE, THYROIDAB in the last 72 hours.  Invalid input(s): FREET3 Anemia work up No results for input(s):  VITAMINB12, FOLATE, FERRITIN, TIBC, IRON, RETICCTPCT in the last 72 hours. Urinalysis    Component Value Date/Time   COLORURINE COLORLESS (A) 06/30/2020 1013   APPEARANCEUR CLEAR 06/30/2020 1013   LABSPEC 1.021 06/30/2020 1013   PHURINE 7.0 06/30/2020 1013   GLUCOSEU NEGATIVE 06/30/2020 1013   HGBUR NEGATIVE 06/30/2020 1013   BILIRUBINUR NEGATIVE 06/30/2020 1013   KETONESUR NEGATIVE 06/30/2020 1013   PROTEINUR NEGATIVE 06/30/2020 1013   NITRITE NEGATIVE 06/30/2020 1013   LEUKOCYTESUR NEGATIVE 06/30/2020 1013   Sepsis Labs Invalid input(s): PROCALCITONIN,  WBC,  LACTICIDVEN Microbiology Recent Results (from the past 240 hour(s))  Respiratory Panel by RT PCR (Flu A&B, Covid) - Nasopharyngeal Swab     Status: None   Collection Time: 06/30/20 10:13 AM   Specimen: Nasopharyngeal Swab  Result Value Ref Range Status   SARS Coronavirus 2 by RT PCR NEGATIVE NEGATIVE Final    Comment: (NOTE) SARS-CoV-2 target nucleic acids are NOT DETECTED.  The SARS-CoV-2 RNA is generally detectable in upper respiratoy specimens during the acute phase of infection. The lowest concentration of SARS-CoV-2 viral copies this assay can detect is 131 copies/mL. A negative result does not preclude SARS-Cov-2 infection and should not be used as the sole basis for treatment or other patient management decisions. A negative result may occur with  improper specimen collection/handling, submission of specimen other than nasopharyngeal swab, presence of viral mutation(s) within the areas targeted by this assay, and inadequate number of viral copies (<131 copies/mL). A negative result must be combined with clinical observations, patient history, and epidemiological information. The expected result is Negative.  Fact Sheet for Patients:  https://www.moore.com/  Fact Sheet for Healthcare Providers:  https://www.young.biz/  This test is no t yet approved or cleared by the  [place] FDA and  has been authorized for detection and/or diagnosis of SARS-CoV-2 by FDA under an Emergency Use Authorization (EUA). This EUA will remain  in effect (meaning this test can be used) for the duration of the COVID-19 declaration under Section 564(b)(1) of the Act, 21 U.S.C. section 360bbb-3(b)(1), unless the authorization is terminated or revoked sooner.     Influenza A by PCR NEGATIVE NEGATIVE Final   Influenza B by PCR NEGATIVE NEGATIVE Final    Comment: (NOTE) The Xpert Xpress SARS-CoV-2/FLU/RSV assay is intended as an aid in  the diagnosis of influenza from Nasopharyngeal swab specimens and  should not be used as a sole basis for treatment. Nasal washings and  aspirates are unacceptable for Xpert Xpress SARS-CoV-2/FLU/RSV  testing.  Fact Sheet for Patients: https://www.moore.com/  Fact Sheet for Healthcare Providers: https://www.young.biz/  This test is not yet approved or cleared by the [place] FDA and  has been authorized for detection and/or diagnosis of SARS-CoV-2 by  FDA under an Emergency Use Authorization (EUA). This EUA will remain  in effect (meaning this test can be used) for the duration of the  Covid-19 declaration under Section 564(b)(1) of the Act, 21  U.S.C.  section 360bbb-3(b)(1), unless the authorization is  terminated or revoked. Performed at [facility], [address] [place], [place] [address]   MRSA PCR Screening     Status: None   Collection Time: 06/30/20  5:29 PM   Specimen: Nasal Mucosa; Nasopharyngeal  Result Value Ref Range Status   MRSA by PCR NEGATIVE NEGATIVE Final    Comment:        The GeneXpert MRSA Assay (FDA approved for NASAL specimens only), is one component of a comprehensive MRSA colonization surveillance program. It is not intended to diagnose MRSA infection nor to guide or monitor treatment for MRSA infections. Performed at [facility], [address], [place], [place] [address]   Culture, blood (routine x 2)     Status: None (Preliminary result)   Collection Time: 07/05/20  3:03 AM   Specimen: BLOOD  Result Value Ref Range Status   Specimen Description BLOOD RIGHT ANTECUBITAL  Final   Special Requests   Final    BOTTLES DRAWN AEROBIC AND ANAEROBIC Blood Culture adequate volume   Culture   Final    NO GROWTH 1 DAY Performed at [facility], [address] [place], [place] [address]    Report Status PENDING  Incomplete  Culture, blood (routine x 2)     Status: None (Preliminary result)   Collection Time: 07/05/20  3:04 AM   Specimen: BLOOD RIGHT HAND  Result Value Ref Range Status   Specimen Description BLOOD RIGHT HAND  Final   Special Requests   Final    BOTTLES DRAWN AEROBIC AND ANAEROBIC Blood Culture adequate volume   Culture   Final    NO GROWTH 1 DAY Performed at [facility], [address] [place], [place] [address]    Report Status PENDING  Incomplete    Please note: You were cared for by a hospitalist during your hospital stay. Once you are discharged, your primary care physician will handle any further medical issues. Please note that NO REFILLS for any discharge medications will be authorized once you are discharged, as it is imperative that you return to your primary care physician (or establish a relationship with a primary care physician if you do not have one) for your post hospital discharge needs so that they can reassess your need for medications and monitor your lab values.    Time coordinating discharge: 40 minutes  SIGNED:   Burnadette Pop, MD  [facility] 07/06/2020, 1:10 PM Pager [phone]  If 7PM-7AM, please contact night-coverage [email or web address] Password [email or web address]
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
## 2020-07-10 LAB — CULTURE, BLOOD (ROUTINE X 2)
Culture: NO GROWTH
Culture: NO GROWTH
Special Requests: ADEQUATE
Special Requests: ADEQUATE

## 2022-01-11 IMAGING — CT CT HEAD CODE STROKE
4 series · 16 of 47 positions shown, 18 images · non-contrast
Comparison: None.

CLINICAL DATA: Code stroke.  Left-sided weakness

EXAM:
CT HEAD WITHOUT CONTRAST
TECHNIQUE: Contiguous axial images were obtained from the base of the skull
through the vertex without intravenous contrast.

[Series 3: head wo · axial · 0.44mm/px · z∈[-78,+42]mm · 7 of 32 slices shown, 9 images]
[im 4/32  brain]
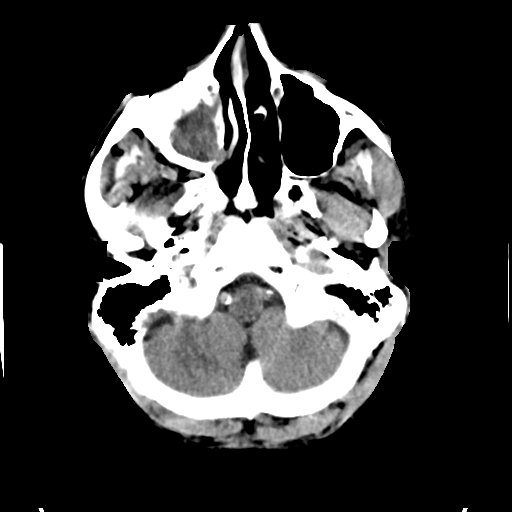
[im 4/32  bone]
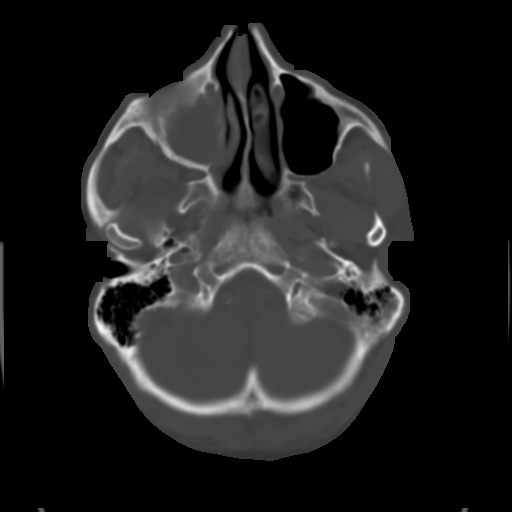
[im 8/32  brain]
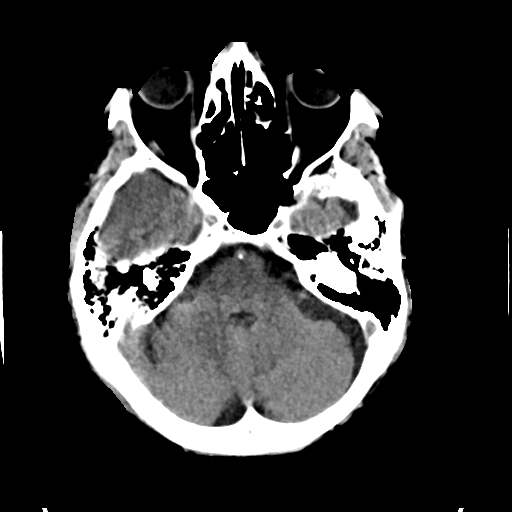
[im 12/32  brain]
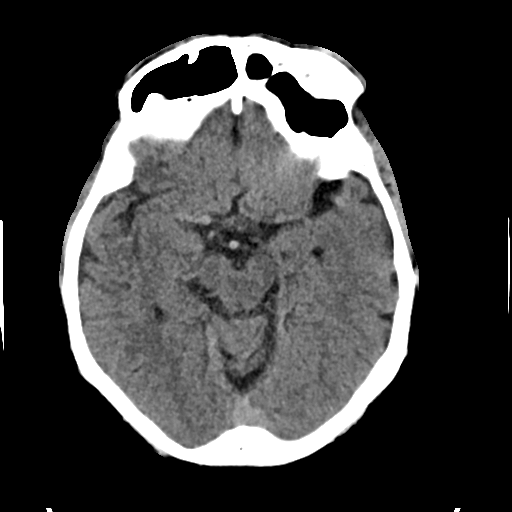
[im 16/32  brain]
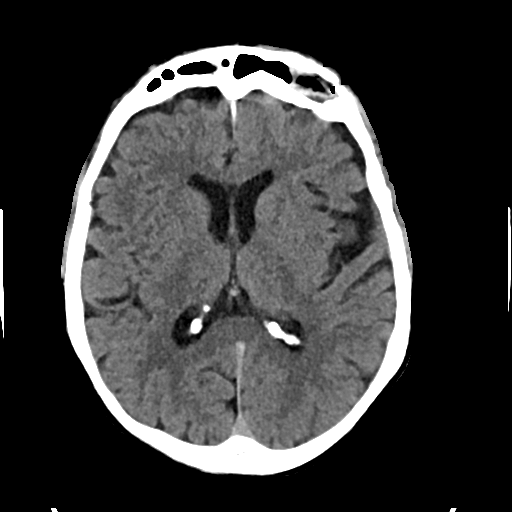
[im 20/32  brain]
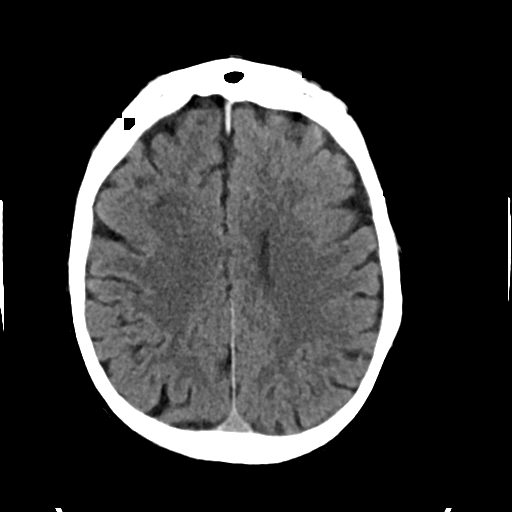
[im 20/32  bone]
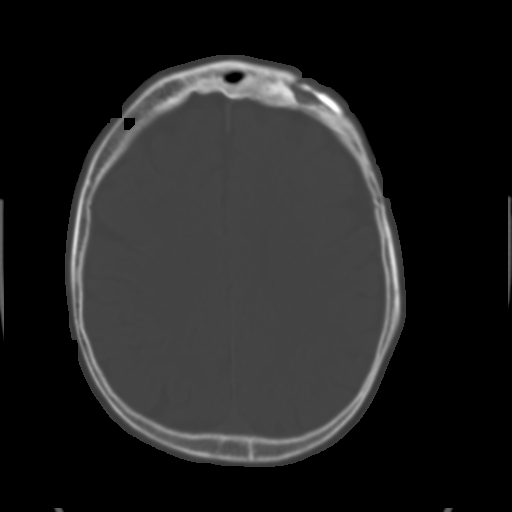
[im 24/32  brain]
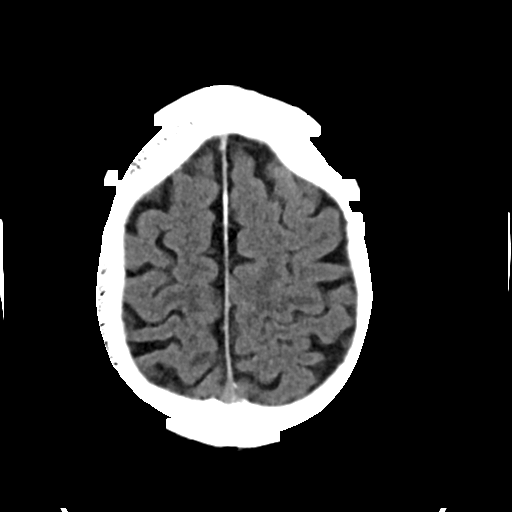
[im 28/32  brain]
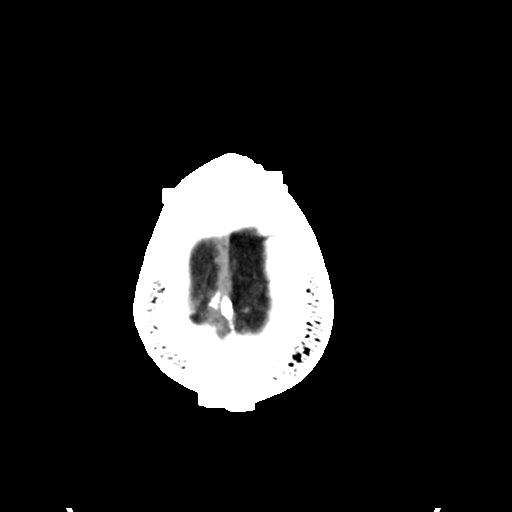

[Series 4: head bone · axial · 0.44mm/px · z∈[-80,-48]mm · 3 of 80 slices shown]
[im 8/80  bone]
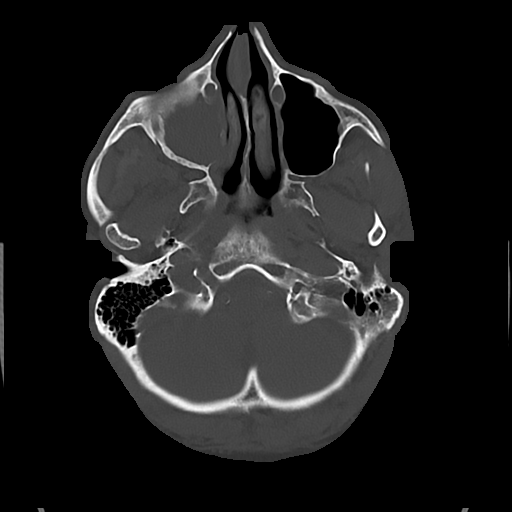
[im 16/80  bone]
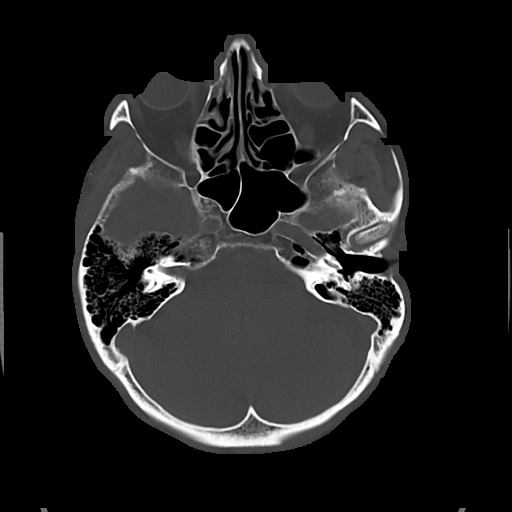
[im 24/80  bone]
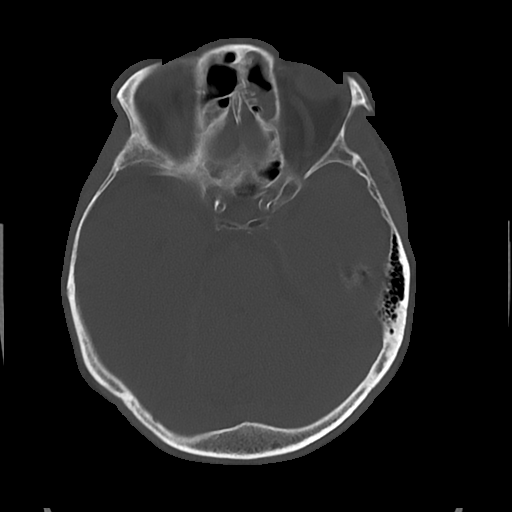

[Series 5: cor soft · coronal · 0.34mm/px · 3 of 68 slices shown]
[im 23/68  brain]
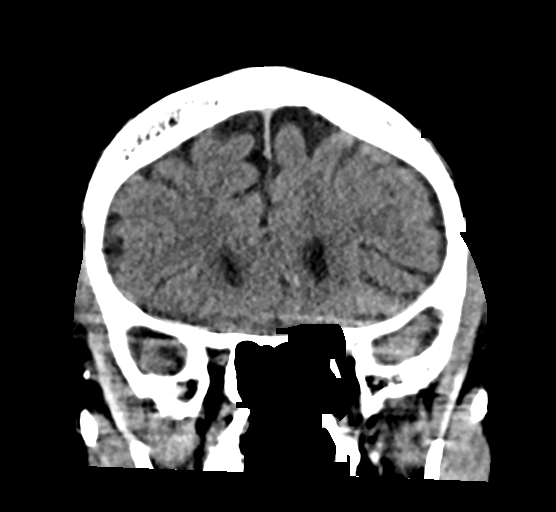
[im 30/68  brain]
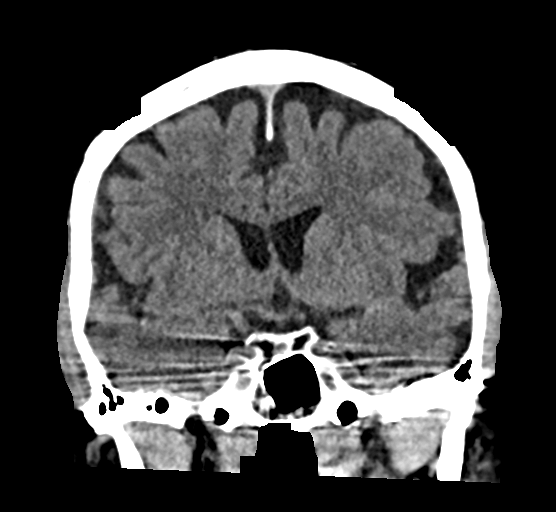
[im 38/68  brain]
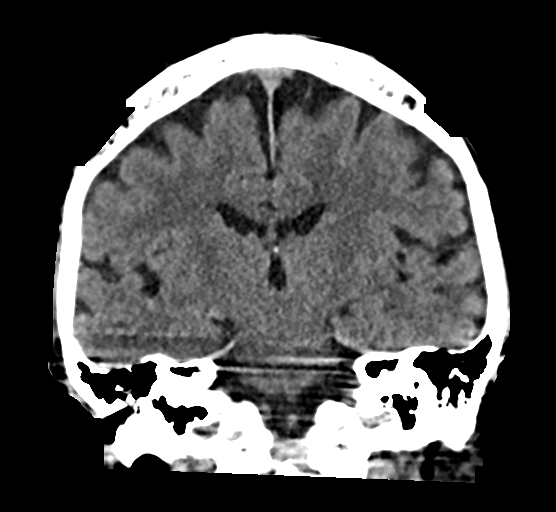

[Series 6: sag soft · sagittal · 0.34mm/px · 3 of 57 slices shown]
[im 19/57  brain]
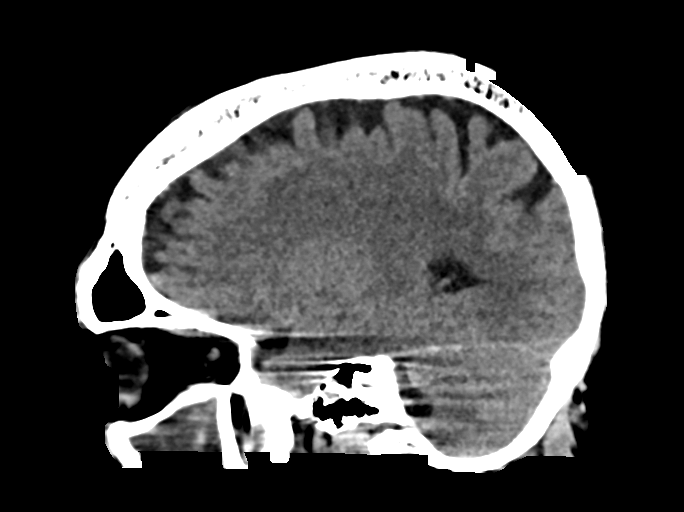
[im 29/57  brain]
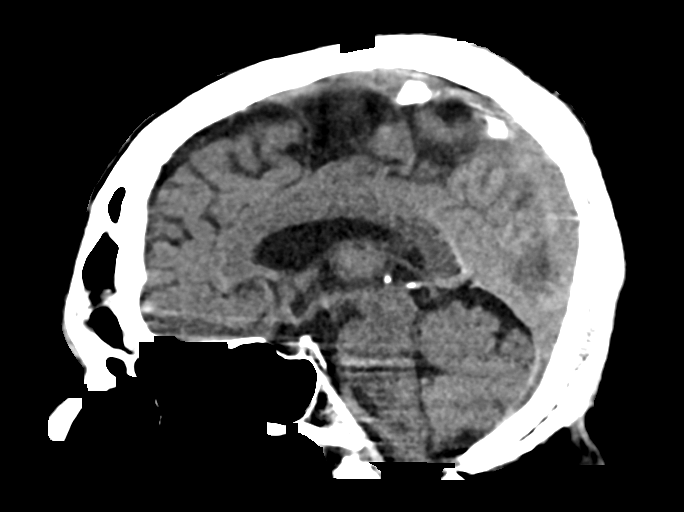
[im 38/57  brain]
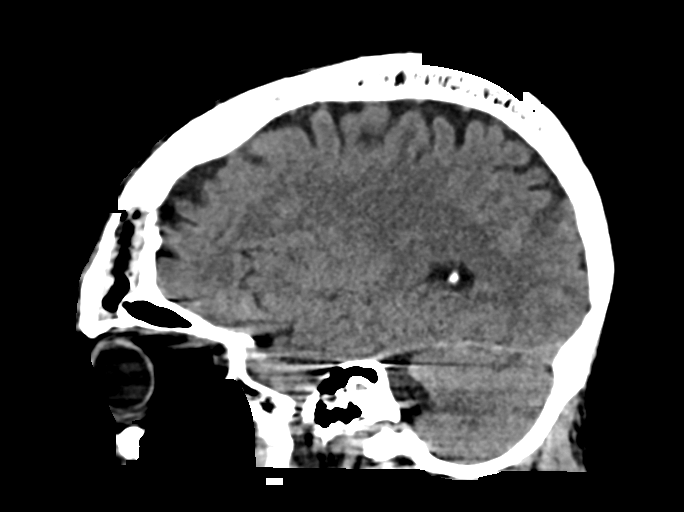

[16 of 47 positions shown; findings below may reference images not displayed]

FINDINGS: Brain: No acute intracranial hemorrhage mass effect, or edema. No
new loss of gray-white differentiation. Small chronic right frontal
infarct. Ventricles are stable in size. No hydrocephalus. Patchy
hypoattenuation in the supratentorial white matter is nonspecific
but may reflect minor chronic microvascular ischemic changes. No
extra-axial collection.

Vascular: No hyperdense vessel. There is intracranial
atherosclerotic calcification at the skull base

Skull: Unremarkable.

Sinuses/Orbits: Chronic left frontal and right maxillary sinusitis.
No acute orbital finding.

Other: Mastoid air cells are clear.

ASPECTS (Alberta Stroke Program Early CT Score)

- Ganglionic level infarction (caudate, lentiform nuclei, internal
capsule, insula, M1-M3 cortex): 7

- Supraganglionic infarction (M4-M6 cortex): 3

Total score (0-10 with 10 being normal): 10
IMPRESSION: There is no acute intracranial hemorrhage or evidence of acute
infarction. ASPECT score is 10.

Small chronic right frontal infarct.

These results were communicated to Dr. Neresh at [DATE] on
06/30/2020 by text page via the AMION messaging system.

## 2022-05-31 DIAGNOSIS — I639 Cerebral infarction, unspecified: Secondary | ICD-10-CM

## 2022-05-31 HISTORY — DX: Cerebral infarction, unspecified: I63.9

## 2022-07-06 HISTORY — PX: TRACHEOSTOMY: SUR1362

## 2022-08-11 ENCOUNTER — Inpatient Hospital Stay (HOSPITAL_COMMUNITY)
Admission: EM | Admit: 2022-08-11 | Discharge: 2022-09-03 | DRG: 392 | Disposition: A | Discharge status: Skilled Nursing Facility | Payer: Medicare Other | Attending: Internal Medicine | Admitting: Internal Medicine

## 2022-08-11 ENCOUNTER — Emergency Department (HOSPITAL_COMMUNITY): Payer: Medicare Other

## 2022-08-11 ENCOUNTER — Encounter (HOSPITAL_COMMUNITY): Payer: Self-pay

## 2022-08-11 ENCOUNTER — Inpatient Hospital Stay (HOSPITAL_COMMUNITY): Payer: Medicare Other

## 2022-08-11 ENCOUNTER — Other Ambulatory Visit: Payer: Self-pay

## 2022-08-11 DIAGNOSIS — Z8249 Family history of ischemic heart disease and other diseases of the circulatory system: Secondary | ICD-10-CM | POA: Diagnosis not present

## 2022-08-11 DIAGNOSIS — R112 Nausea with vomiting, unspecified: Secondary | ICD-10-CM | POA: Diagnosis present

## 2022-08-11 DIAGNOSIS — K222 Esophageal obstruction: Secondary | ICD-10-CM | POA: Diagnosis present

## 2022-08-11 DIAGNOSIS — K5900 Constipation, unspecified: Secondary | ICD-10-CM

## 2022-08-11 DIAGNOSIS — E861 Hypovolemia: Secondary | ICD-10-CM | POA: Diagnosis not present

## 2022-08-11 DIAGNOSIS — K219 Gastro-esophageal reflux disease without esophagitis: Secondary | ICD-10-CM | POA: Diagnosis present

## 2022-08-11 DIAGNOSIS — I69354 Hemiplegia and hemiparesis following cerebral infarction affecting left non-dominant side: Secondary | ICD-10-CM | POA: Diagnosis not present

## 2022-08-11 DIAGNOSIS — Z930 Tracheostomy status: Secondary | ICD-10-CM

## 2022-08-11 DIAGNOSIS — Z751 Person awaiting admission to adequate facility elsewhere: Secondary | ICD-10-CM | POA: Diagnosis not present

## 2022-08-11 DIAGNOSIS — Z888 Allergy status to other drugs, medicaments and biological substances status: Secondary | ICD-10-CM | POA: Diagnosis not present

## 2022-08-11 DIAGNOSIS — Z430 Encounter for attention to tracheostomy: Secondary | ICD-10-CM

## 2022-08-11 DIAGNOSIS — I959 Hypotension, unspecified: Secondary | ICD-10-CM | POA: Diagnosis not present

## 2022-08-11 DIAGNOSIS — R5381 Other malaise: Secondary | ICD-10-CM | POA: Diagnosis present

## 2022-08-11 DIAGNOSIS — R1310 Dysphagia, unspecified: Secondary | ICD-10-CM | POA: Diagnosis not present

## 2022-08-11 DIAGNOSIS — K449 Diaphragmatic hernia without obstruction or gangrene: Secondary | ICD-10-CM | POA: Diagnosis present

## 2022-08-11 DIAGNOSIS — Z79899 Other long term (current) drug therapy: Secondary | ICD-10-CM | POA: Diagnosis not present

## 2022-08-11 DIAGNOSIS — E222 Syndrome of inappropriate secretion of antidiuretic hormone: Secondary | ICD-10-CM | POA: Diagnosis present

## 2022-08-11 DIAGNOSIS — E876 Hypokalemia: Secondary | ICD-10-CM | POA: Diagnosis present

## 2022-08-11 DIAGNOSIS — Z1152 Encounter for screening for COVID-19: Secondary | ICD-10-CM | POA: Diagnosis not present

## 2022-08-11 DIAGNOSIS — G40909 Epilepsy, unspecified, not intractable, without status epilepticus: Secondary | ICD-10-CM | POA: Diagnosis present

## 2022-08-11 DIAGNOSIS — Z8673 Personal history of transient ischemic attack (TIA), and cerebral infarction without residual deficits: Secondary | ICD-10-CM | POA: Diagnosis not present

## 2022-08-11 DIAGNOSIS — R1110 Vomiting, unspecified: Secondary | ICD-10-CM | POA: Diagnosis present

## 2022-08-11 DIAGNOSIS — I10 Essential (primary) hypertension: Secondary | ICD-10-CM

## 2022-08-11 DIAGNOSIS — N281 Cyst of kidney, acquired: Secondary | ICD-10-CM | POA: Diagnosis present

## 2022-08-11 DIAGNOSIS — R1115 Cyclical vomiting syndrome unrelated to migraine: Secondary | ICD-10-CM | POA: Diagnosis not present

## 2022-08-11 DIAGNOSIS — R339 Retention of urine, unspecified: Secondary | ICD-10-CM | POA: Diagnosis present

## 2022-08-11 DIAGNOSIS — M109 Gout, unspecified: Secondary | ICD-10-CM | POA: Diagnosis present

## 2022-08-11 DIAGNOSIS — R569 Unspecified convulsions: Secondary | ICD-10-CM

## 2022-08-11 DIAGNOSIS — R1319 Other dysphagia: Secondary | ICD-10-CM | POA: Diagnosis not present

## 2022-08-11 HISTORY — DX: Personal history of other endocrine, nutritional and metabolic disease: Z86.39

## 2022-08-11 HISTORY — DX: Stenosis of larynx: J38.6

## 2022-08-11 HISTORY — DX: Hiccough: R06.6

## 2022-08-11 HISTORY — DX: Nausea with vomiting, unspecified: R11.2

## 2022-08-11 HISTORY — DX: Retention of urine, unspecified: R33.9

## 2022-08-11 LAB — CBC WITH DIFFERENTIAL/PLATELET
Abs Immature Granulocytes: 0 10*3/uL (ref 0.00–0.07)
Basophils Absolute: 0 10*3/uL (ref 0.0–0.1)
Basophils Relative: 1 %
Eosinophils Absolute: 0.3 10*3/uL (ref 0.0–0.5)
Eosinophils Relative: 7 %
HCT: 36.8 % — ABNORMAL LOW (ref 39.0–52.0)
Hemoglobin: 12.2 g/dL — ABNORMAL LOW (ref 13.0–17.0)
Immature Granulocytes: 0 %
Lymphocytes Relative: 46 %
Lymphs Abs: 1.8 10*3/uL (ref 0.7–4.0)
MCH: 26.1 pg (ref 26.0–34.0)
MCHC: 33.2 g/dL (ref 30.0–36.0)
MCV: 78.6 fL — ABNORMAL LOW (ref 80.0–100.0)
Monocytes Absolute: 0.5 10*3/uL (ref 0.1–1.0)
Monocytes Relative: 13 %
Neutro Abs: 1.3 10*3/uL — ABNORMAL LOW (ref 1.7–7.7)
Neutrophils Relative %: 33 %
Platelets: 242 10*3/uL (ref 150–400)
RBC: 4.68 MIL/uL (ref 4.22–5.81)
RDW: 18.3 % — ABNORMAL HIGH (ref 11.5–15.5)
WBC: 3.9 10*3/uL — ABNORMAL LOW (ref 4.0–10.5)
nRBC: 0 % (ref 0.0–0.2)

## 2022-08-11 LAB — COMPREHENSIVE METABOLIC PANEL
ALT: 11 U/L (ref 0–44)
AST: 19 U/L (ref 15–41)
Albumin: 4 g/dL (ref 3.5–5.0)
Alkaline Phosphatase: 73 U/L (ref 38–126)
Anion gap: 11 (ref 5–15)
BUN: 10 mg/dL (ref 8–23)
CO2: 24 mmol/L (ref 22–32)
Calcium: 9.7 mg/dL (ref 8.9–10.3)
Chloride: 106 mmol/L (ref 98–111)
Creatinine, Ser: 0.98 mg/dL (ref 0.61–1.24)
GFR, Estimated: >60 mL/min (ref >60)
Glucose, Bld: 97 mg/dL (ref 70–99)
Potassium: 3.3 mmol/L — ABNORMAL LOW (ref 3.5–5.1)
Sodium: 141 mmol/L (ref 135–145)
Total Bilirubin: 1 mg/dL (ref 0.3–1.2)
Total Protein: 7.8 g/dL (ref 6.5–8.1)

## 2022-08-11 LAB — RESP PANEL BY RT-PCR (RSV, FLU A&B, COVID)  RVPGX2
Influenza A by PCR: NEGATIVE
Influenza B by PCR: NEGATIVE
Resp Syncytial Virus by PCR: NEGATIVE
SARS Coronavirus 2 by RT PCR: NEGATIVE

## 2022-08-11 LAB — LIPASE, BLOOD: Lipase: 34 U/L (ref 11–51)

## 2022-08-11 MED ORDER — ACETAMINOPHEN 650 MG RE SUPP: 650.0000 mg | Freq: Four times a day (QID) | RECTAL | Status: DC | PRN

## 2022-08-11 MED ORDER — AMLODIPINE BESYLATE 10 MG PO TABS: 10.0000 mg | ORAL_TABLET | Freq: Every day | ORAL | Status: DC

## 2022-08-11 MED ORDER — SODIUM CHLORIDE 1 G PO TABS
1.0000 g | ORAL_TABLET | Freq: Three times a day (TID) | ORAL | Status: DC
  Administered 2022-08-12 – 2022-09-03 (×67): 1 g via ORAL
  Filled 2022-08-11 (×68): qty 1

## 2022-08-11 MED ORDER — TRAZODONE HCL 50 MG PO TABS
50.0000 mg | ORAL_TABLET | Freq: Every day | ORAL | Status: DC
  Administered 2022-08-11 – 2022-09-02 (×23): 50 mg via ORAL
  Filled 2022-08-11 (×23): qty 1

## 2022-08-11 MED ORDER — SODIUM CHLORIDE 0.9% FLUSH
3.0000 mL | INTRAVENOUS | Status: DC | PRN
  Administered 2022-08-12 – 2022-08-13 (×2): 3 mL via INTRAVENOUS

## 2022-08-11 MED ORDER — FOLIC ACID 1 MG PO TABS
1.0000 mg | ORAL_TABLET | Freq: Every day | ORAL | Status: DC
  Administered 2022-08-13 – 2022-09-03 (×21): 1 mg via ORAL
  Filled 2022-08-11 (×21): qty 1

## 2022-08-11 MED ORDER — TAMSULOSIN HCL 0.4 MG PO CAPS
0.4000 mg | ORAL_CAPSULE | Freq: Every day | ORAL | Status: DC
  Administered 2022-08-13 – 2022-09-02 (×21): 0.4 mg via ORAL
  Filled 2022-08-11 (×21): qty 1

## 2022-08-11 MED ORDER — ACETAMINOPHEN 325 MG PO TABS
650.0000 mg | ORAL_TABLET | Freq: Four times a day (QID) | ORAL | Status: DC | PRN
  Administered 2022-08-15: 650 mg via ORAL
  Filled 2022-08-11 (×2): qty 2

## 2022-08-11 MED ORDER — ENOXAPARIN SODIUM 40 MG/0.4ML IJ SOSY
40.0000 mg | PREFILLED_SYRINGE | INTRAMUSCULAR | Status: DC
  Administered 2022-08-11 – 2022-08-12 (×2): 40 mg via SUBCUTANEOUS
  Filled 2022-08-11 (×2): qty 0.4

## 2022-08-11 MED ORDER — CHLORPROMAZINE HCL 25 MG PO TABS
25.0000 mg | ORAL_TABLET | Freq: Three times a day (TID) | ORAL | Status: DC
  Administered 2022-08-11 – 2022-08-12 (×3): 25 mg via ORAL
  Filled 2022-08-11 (×4): qty 1

## 2022-08-11 MED ORDER — METOCLOPRAMIDE HCL 10 MG PO TABS
5.0000 mg | ORAL_TABLET | Freq: Three times a day (TID) | ORAL | Status: DC
  Filled 2022-08-11: qty 1

## 2022-08-11 MED ORDER — IOHEXOL 350 MG/ML SOLN
75.0000 mL | Freq: Once | INTRAVENOUS | Status: AC | PRN
  Administered 2022-08-11: 75 mL via INTRAVENOUS

## 2022-08-11 MED ORDER — FINASTERIDE 5 MG PO TABS
5.0000 mg | ORAL_TABLET | Freq: Every day | ORAL | Status: DC
  Administered 2022-08-11 – 2022-09-03 (×22): 5 mg via ORAL
  Filled 2022-08-11 (×22): qty 1

## 2022-08-11 MED ORDER — LEVETIRACETAM 500 MG PO TABS
1000.0000 mg | ORAL_TABLET | Freq: Two times a day (BID) | ORAL | Status: DC
  Administered 2022-08-11: 1000 mg via ORAL
  Filled 2022-08-11: qty 2

## 2022-08-11 MED ORDER — SODIUM CHLORIDE 0.9 % IV SOLN
250.0000 mL | INTRAVENOUS | Status: DC | PRN
  Administered 2022-08-13: 250 mL via INTRAVENOUS

## 2022-08-11 MED ORDER — SODIUM CHLORIDE 0.9% FLUSH
3.0000 mL | Freq: Two times a day (BID) | INTRAVENOUS | Status: DC
  Administered 2022-08-11 – 2022-08-31 (×13): 3 mL via INTRAVENOUS

## 2022-08-11 MED ORDER — FLEET ENEMA 7-19 GM/118ML RE ENEM
1.0000 | ENEMA | Freq: Once | RECTAL | Status: DC
  Filled 2022-08-11: qty 1

## 2022-08-11 MED ORDER — SENNA 8.6 MG PO TABS
1.0000 | ORAL_TABLET | Freq: Two times a day (BID) | ORAL | Status: DC
  Administered 2022-08-11 – 2022-09-03 (×23): 8.6 mg via ORAL
  Filled 2022-08-11 (×35): qty 1

## 2022-08-11 MED ORDER — ONDANSETRON HCL 4 MG/2ML IJ SOLN
4.0000 mg | Freq: Once | INTRAMUSCULAR | Status: AC
  Administered 2022-08-11: 4 mg via INTRAVENOUS
  Filled 2022-08-11: qty 2

## 2022-08-11 MED ORDER — LACTATED RINGERS IV BOLUS
1000.0000 mL | Freq: Once | INTRAVENOUS | Status: AC
  Administered 2022-08-11: 1000 mL via INTRAVENOUS

## 2022-08-11 MED ORDER — METOPROLOL TARTRATE 50 MG PO TABS
50.0000 mg | ORAL_TABLET | Freq: Two times a day (BID) | ORAL | Status: DC
  Administered 2022-08-11 – 2022-08-12 (×2): 50 mg via ORAL
  Filled 2022-08-11: qty 1
  Filled 2022-08-11: qty 2

## 2022-08-11 MED ORDER — PANTOPRAZOLE SODIUM 40 MG PO TBEC
40.0000 mg | DELAYED_RELEASE_TABLET | Freq: Every day | ORAL | Status: DC
  Administered 2022-08-11: 40 mg via ORAL
  Filled 2022-08-11: qty 1

## 2022-08-11 MED ORDER — LISINOPRIL 10 MG PO TABS: 5.0000 mg | ORAL_TABLET | Freq: Every day | ORAL | Status: DC

## 2022-08-11 MED ORDER — MAGNESIUM OXIDE -MG SUPPLEMENT 400 (240 MG) MG PO TABS
200.0000 mg | ORAL_TABLET | Freq: Every day | ORAL | Status: DC
  Administered 2022-08-13 – 2022-09-03 (×21): 200 mg via ORAL
  Filled 2022-08-11 (×20): qty 1

## 2022-08-11 MED ORDER — FERROUS SULFATE 325 (65 FE) MG PO TABS
325.0000 mg | ORAL_TABLET | Freq: Every day | ORAL | Status: DC
  Filled 2022-08-11 (×2): qty 1

## 2022-08-11 MED ORDER — THIAMINE MONONITRATE 100 MG PO TABS
100.0000 mg | ORAL_TABLET | Freq: Every day | ORAL | Status: DC
  Administered 2022-08-11 – 2022-09-03 (×22): 100 mg via ORAL
  Filled 2022-08-11 (×22): qty 1

## 2022-08-11 NOTE — Assessment & Plan Note (Signed)
History of seizure following CVA. He is followed by [facility] neurology. Last viist 08/05/22  Plan Continue Keppra  No indication for in-patient evaluation

## 2022-08-11 NOTE — ED Provider Notes (Signed)
  Physical Exam  BP (!) 142/71   Pulse 63   Temp 98.3 F (36.8 C)   Resp (!) 26   SpO2 100%     Procedures  Procedures  ED Course / MDM    Medical Decision Making Amount and/or Complexity of Data Reviewed Labs: ordered. Radiology: ordered.  Risk OTC drugs. Prescription drug management. Decision regarding hospitalization.   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 Waiting on CT Abdomen, plan for [facility] consult for placement if workup negative.    CT Results:  FINDINGS:  Lower chest: Large hiatal hernia.  Bibasilar atelectasis.    Hepatobiliary: No focal liver abnormality is seen. No gallstones,  gallbladder wall thickening, or biliary dilatation.    Pancreas: Unremarkable. No pancreatic ductal dilatation or  surrounding inflammatory changes.    Spleen: Normal in size without focal abnormality.    Adrenals/Urinary Tract: Adrenal glands are unremarkable. Bilateral  simple renal cysts. Kidneys are normal, without renal calculi, focal  lesion, or hydronephrosis. Bladder is unremarkable.    Stomach/Bowel: Stomach is within normal limits. Appendix appears  normal. No evidence of bowel wall thickening, distention, or  inflammatory changes. Moderate amount of retained colonic stool  suggesting constipation.    Vascular/Lymphatic: No significant vascular findings are present. No  enlarged abdominal or pelvic lymph nodes.    Reproductive: Prostate is unremarkable.    Other: No abdominal wall hernia or abnormality. No abdominopelvic  ascites.    Musculoskeletal: No acute or significant osseous findings.    IMPRESSION:  1. No CT evidence of acute abdominal/pelvic process.  2. Large hiatal hernia.  3. Moderate amount of retained colonic stool suggesting  constipation. No evidence of bowel obstruction.  4. Bilateral simple renal cysts. No evidence of nephrolithiasis or  hydronephrosis.    Pt has a moderate amount of  retained stool suggesting constipation. Recommended admission for bowel regimen in the setting of nausea and vomiting and constipation. Plan for social work consultation in the setting of family stating that they are unable to care for the patient in the home as patient may need SNF placement.  Hospitalist medicine consulted for admission.     Ernie Avena, MD 08/11/22 2008

## 2022-08-11 NOTE — Progress Notes (Signed)
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 Per pt he does not wear any oxygen or humidification at home. Pt let to room at this time per request. RT will continue to monitor and be available as needed.

## 2022-08-11 NOTE — Assessment & Plan Note (Signed)
Patient not getting adequate trach care at home. Has recurrent problems with mucus build up  Plan  Routine tracheostomy care  Long-term placement for continued care

## 2022-08-11 NOTE — ED Notes (Signed)
Put in IV consult for difficult IV start.

## 2022-08-11 NOTE — ED Notes (Signed)
Admitting MD at bedside. Relayed information to this RN that patient may eat a soft mechanical diet.

## 2022-08-11 NOTE — ED Notes (Signed)
Patient provided applesauce and chicken broth at this time
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
## 2022-08-11 NOTE — ED Notes (Signed)
Got patient undressed on the monitor did EKG patient is resting with call bell in reach got patient some warm balnkets

## 2022-08-11 NOTE — Assessment & Plan Note (Signed)
Patient with frequently recurrent N/V. Not diabetic. He does take reglan before meals at home.  Plan Gastric empyting study to evaluate for gastroparesis  Continue reglan.
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
## 2022-08-11 NOTE — Assessment & Plan Note (Signed)
Patient wth chronic urinary retention w/o diagnosis of BPH. He has not seen urology. A PCP started him on Proscar and Fenesteride which alleviates his symptoms.  Plan Continue home meds  Pelvic U/S to assess for BPH

## 2022-08-11 NOTE — ED Notes (Signed)
Called CT to inform them of his IV

## 2022-08-11 NOTE — Subjective & Objective (Signed)
71 y.o. 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 He  presents to [facility] emergency department for evaluation of vomiting.  Patient has a recent ER visit on 07/26/2022 for the same symptoms.  Family is concerned that they are unable to care for his tracheostomy needs or general medical needs at home.

## 2022-08-11 NOTE — ED Triage Notes (Signed)
Patient bib [facility] from home with complaints of vomiting x2days. Family states trach is poorly managed and really want him to find placement in a skilled nursing facility [facility]

## 2022-08-11 NOTE — Assessment & Plan Note (Signed)
H/o SIADH and frequent episodes of hypokalemia. At admission K 3.3  Plan  Continue potassium replacement

## 2022-08-12 DIAGNOSIS — R112 Nausea with vomiting, unspecified: Secondary | ICD-10-CM | POA: Diagnosis not present

## 2022-08-12 LAB — URINALYSIS, ROUTINE W REFLEX MICROSCOPIC
Bilirubin Urine: NEGATIVE
Glucose, UA: NEGATIVE mg/dL
Hgb urine dipstick: NEGATIVE
Ketones, ur: NEGATIVE mg/dL
Leukocytes,Ua: NEGATIVE
Nitrite: NEGATIVE
Protein, ur: NEGATIVE mg/dL
Specific Gravity, Urine: 1.013 (ref 1.005–1.030)
pH: 7 (ref 5.0–8.0)

## 2022-08-12 LAB — BASIC METABOLIC PANEL
Anion gap: 11 (ref 5–15)
BUN: 7 mg/dL — ABNORMAL LOW (ref 8–23)
CO2: 21 mmol/L — ABNORMAL LOW (ref 22–32)
Calcium: 8.8 mg/dL — ABNORMAL LOW (ref 8.9–10.3)
Chloride: 106 mmol/L (ref 98–111)
Creatinine, Ser: 1.03 mg/dL (ref 0.61–1.24)
GFR, Estimated: >60 mL/min (ref >60)
Glucose, Bld: 91 mg/dL (ref 70–99)
Potassium: 3.4 mmol/L — ABNORMAL LOW (ref 3.5–5.1)
Sodium: 138 mmol/L (ref 135–145)

## 2022-08-12 LAB — CBC
HCT: 32.9 % — ABNORMAL LOW (ref 39.0–52.0)
Hemoglobin: 10.8 g/dL — ABNORMAL LOW (ref 13.0–17.0)
MCH: 25.9 pg — ABNORMAL LOW (ref 26.0–34.0)
MCHC: 32.8 g/dL (ref 30.0–36.0)
MCV: 78.9 fL — ABNORMAL LOW (ref 80.0–100.0)
Platelets: 226 10*3/uL (ref 150–400)
RBC: 4.17 MIL/uL — ABNORMAL LOW (ref 4.22–5.81)
RDW: 18.3 % — ABNORMAL HIGH (ref 11.5–15.5)
WBC: 4.3 10*3/uL (ref 4.0–10.5)
nRBC: 0 % (ref 0.0–0.2)

## 2022-08-12 LAB — VITAMIN B12: Vitamin B-12: 443 pg/mL (ref 180–914)

## 2022-08-12 MED ORDER — POTASSIUM CHLORIDE 10 MEQ/100ML IV SOLN
10.0000 meq | INTRAVENOUS | Status: AC
  Administered 2022-08-12 (×3): 10 meq via INTRAVENOUS
  Filled 2022-08-12 (×3): qty 100

## 2022-08-12 MED ORDER — ONDANSETRON HCL 4 MG/2ML IJ SOLN
4.0000 mg | Freq: Four times a day (QID) | INTRAMUSCULAR | Status: DC | PRN
  Administered 2022-08-12 – 2022-08-17 (×3): 4 mg via INTRAVENOUS
  Filled 2022-08-12 (×4): qty 2

## 2022-08-12 MED ORDER — FLEET ENEMA 7-19 GM/118ML RE ENEM: 1.0000 | ENEMA | Freq: Once | RECTAL | Status: DC

## 2022-08-12 MED ORDER — LEVETIRACETAM IN NACL 1000 MG/100ML IV SOLN
1000.0000 mg | Freq: Two times a day (BID) | INTRAVENOUS | Status: DC
  Administered 2022-08-12 – 2022-08-15 (×6): 1000 mg via INTRAVENOUS
  Filled 2022-08-12 (×8): qty 100

## 2022-08-12 MED ORDER — LACTATED RINGERS IV SOLN
INTRAVENOUS | Status: DC
  Administered 2022-08-12: 75 mL/h via INTRAVENOUS

## 2022-08-12 MED ORDER — METOCLOPRAMIDE HCL 5 MG/ML IJ SOLN
5.0000 mg | Freq: Three times a day (TID) | INTRAMUSCULAR | Status: DC
  Administered 2022-08-12 – 2022-08-15 (×9): 5 mg via INTRAVENOUS
  Filled 2022-08-12 (×9): qty 2

## 2022-08-12 NOTE — ED Notes (Signed)
Pt eating breakfast. Has a urinal 2/4 full of emesis. Instructed pt to stop eating if he was vomiting. Pt finished breakfast tray.

## 2022-08-12 NOTE — Progress Notes (Signed)
Patient has been calm and cooperative throughout shift. Patient unable to tolerate lunch or dinner. Patient vomited x2. Patient does not complain of pain. Patient requesting food and candy despite vomiting. Nurse advised patient to wait until nausea and vomiting is controlled-patient confirmed understanding.

## 2022-08-12 NOTE — ED Notes (Signed)
Dr. Sunnie Nielsen contacted about pt vomiting. Order for Zofran received. Awaiting verification from pharmacy.

## 2022-08-12 NOTE — Evaluation (Signed)
Occupational Therapy Evaluation Patient Details Name: Evan Jacobs MRN: [id number] DOB: Mar 02, 1951 Today's Date: 08/12/2022   History of Present Illness 71 y.o. male who presented to the ED for vomiting. Workup pending. 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   Clinical Impression   Pt was evaluated s/p the above admission list, PLOF and home set up difficult to obtain due to pt's trach. He stated that he lives with his girlfriend, he uses a RW and a WC and is generally indep with ADLs. Upon arrival, pt was actively vomiting after just eating food. Assisted pt with UB cleaning given set up - min A. He also required up to min A for transfers with RW. OT to continue to follow acutely. Recommend d/c to SNF for safety.      Recommendations for follow up therapy are one component of a multi-disciplinary discharge planning process, led by the attending physician.  Recommendations may be updated based on patient status, additional functional criteria and insurance authorization.   Follow Up Recommendations  Skilled nursing-short term rehab (<3 hours/day) (for trach management)     Assistance Recommended at Discharge Intermittent Supervision/Assistance  Patient can return home with the following A little help with walking and/or transfers;A little help with bathing/dressing/bathroom;Assistance with cooking/housework;Assistance with feeding;Assist for transportation;Help with stairs or ramp for entrance    Functional Status Assessment  Patient has had a recent decline in their functional status and demonstrates the ability to make significant improvements in function in a reasonable and predictable amount of time.  Equipment Recommendations  None recommended by OT       Precautions /  Restrictions Precautions Precautions: Fall Precaution Comments: trach Restrictions Weight Bearing Restrictions: No      Mobility Bed Mobility Overal bed mobility: Needs Assistance Bed Mobility: Supine to Sit, Sit to Supine     Supine to sit: Supervision Sit to supine: Supervision        Transfers Overall transfer level: Needs assistance Equipment used: Rolling walker (2 wheels) Transfers: Sit to/from Stand Sit to Stand: Min assist                  Balance Overall balance assessment: Needs assistance Sitting-balance support: Feet supported Sitting balance-Leahy Scale: Good     Standing balance support: Bilateral upper extremity supported, During functional activity Standing balance-Leahy Scale: Poor Standing balance comment: did not assess                           ADL either performed or assessed with clinical judgement   ADL Overall ADL's : Needs assistance/impaired Eating/Feeding: Independent;Sitting Eating/Feeding Details (indicate cue type and reason): pt with active emesis upon arrival after eating Grooming: Set up;Sitting   Upper Body Bathing: Set up;Sitting   Lower Body Bathing: Minimal assistance;Sit to/from stand   Upper Body Dressing : Set up;Sitting   Lower Body Dressing: Minimal assistance;Sit to/from stand   Toilet Transfer: Minimal assistance;Ambulation;Rolling walker (2 wheels)   Toileting- Clothing Manipulation and Hygiene: Supervision/safety;Sitting/lateral lean       Functional mobility during ADLs: Minimal assistance;Rolling walker (2 wheels) General ADL Comments: limited by impiared communication, weakness, decreased activity tolerance and vomiting     Vision Baseline Vision/History: 0 No visual deficits Vision Assessment?: No apparent visual deficits     Perception Perception Perception Tested?:  No   Praxis Praxis Praxis tested?: Not tested    Pertinent Vitals/Pain Pain Assessment Pain Assessment:  Faces Faces Pain Scale: Hurts a little bit Pain Location: stomache Pain Descriptors / Indicators: Discomfort Pain Intervention(s): Monitored during session, Limited activity within patient's tolerance        Extremity/Trunk Assessment Upper Extremity Assessment Upper Extremity Assessment: RUE deficits/detail;LUE deficits/detail RUE Deficits / Details: overall WFL for funcitonal tasks assessed RUE Coordination: WNL LUE Deficits / Details: 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 pt attempting to communicate through mouthing words but difficult to decipher     General Comments  VSS, RN notified of emesis     Home Living Family/patient expects to be discharged to:: Private residence Living Arrangements: Spouse/significant other Available Help at Discharge: Available 24 hours/day (girlfriend) Type of Home: Apartment Home Access: Stairs to enter [employer] of Steps: 3 Entrance Stairs-Rails: Right;Left Home Layout: One level     Bathroom Shower/Tub: Sponge bathes at baseline   [employer]: Standard     Home Equipment: Agricultural consultant (2 wheels);Cane - single point;Wheelchair - manual          Prior Functioning/Environment Prior Level of Function : Independent/Modified Independent             Mobility Comments: walks with RW, further distances with WC ADLs Comments: difficult to obtain accurate PLOF - pt        OT Problem List: Decreased strength;Decreased  activity tolerance;Decreased range of motion;Impaired balance (sitting and/or standing);Decreased knowledge of use of DME or AE;Decreased safety awareness;Decreased knowledge of precautions      OT Treatment/Interventions: Self-care/ADL training;Therapeutic exercise;DME and/or AE instruction;Therapeutic activities;Patient/family education;Balance training    OT Goals(Current goals can be found in the care plan section) Acute Rehab OT Goals Patient Stated Goal: to get some ice cream OT Goal Formulation: With patient Time For Goal Achievement: 08/26/22 Potential to Achieve Goals: Good ADL Goals Pt Will Perform Grooming: with modified independence;standing Pt Will Perform Upper Body Dressing: with modified independence;sitting Pt Will Perform Lower Body Dressing: with modified independence;sit to/from stand Pt Will Transfer to Toilet: with modified independence;ambulating  OT Frequency: Min 2X/week       AM-PAC OT "6 Clicks" Daily Activity     Outcome Measure Help from another person eating meals?: None Help from another person taking care of personal grooming?: A Little Help from another person toileting, which includes using toliet, bedpan, or urinal?: A Little Help from another person bathing (including washing, rinsing, drying)?: A Little Help from another person to put on and taking off regular upper body clothing?: None Help from another person to put on and taking off regular lower body clothing?: A Little 6 Click Score: 20   End of Session Equipment Utilized During Treatment: Rolling walker (2 wheels) Nurse Communication: Mobility status  Activity Tolerance: Patient tolerated treatment well Patient left: in bed;with call bell/phone within reach;with nursing/sitter in room  OT Visit Diagnosis: Unsteadiness on feet (R26.81);Other abnormalities of gait and mobility (R26.89);Muscle  weakness (generalized) (M62.81)                Time: 8251-8984 OT Time Calculation (min): 21  min Charges:  OT General Charges $OT Visit: 1 Visit OT Evaluation $OT Eval Moderate Complexity: 1 Mod   Rocket Gunderson D Causey 08/12/2022, 4:09 PM

## 2022-08-12 NOTE — ED Notes (Signed)
Pt still has emesis. Will give prn Zofran.

## 2022-08-12 NOTE — ED Notes (Signed)
Attempted IV x2. Will consult IV team.

## 2022-08-12 NOTE — ED Notes (Signed)
Pt asking for a Snickers candy bar. Informed pt that we would have to get vomiting controlled before giving him a candy bar.

## 2022-08-12 NOTE — ED Notes (Signed)
Patient resting in left side in bed. Patient denies any other needs at this time.

## 2022-08-12 NOTE — Evaluation (Signed)
Physical Therapy Evaluation Patient Details Name: Evan Jacobs MRN: [id number] DOB: 1950-12-05 Today's Date: 08/12/2022  History of Present Illness  71 y.o. male who presented to the ED for vomiting. Workup pending. 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  Clinical Impression   Pt presents with generalized weakness L>R given history of cva, impaired balance, impaired gait, and decreased activity tolerance. Pt to benefit from acute PT to address deficits. Pt ambulated short room distance, overall mobilizing at close guard to light physical assist level. Per chart review, pt and family having difficulty with trach care and pt endorses history of fall. PT recommending ST-SNF for trach care and maximizing functional status. PT to progress mobility as tolerated, and will continue to follow acutely.         Recommendations for follow up therapy are one component of a multi-disciplinary discharge planning process, led by the attending physician.  Recommendations may be updated based on patient status, additional functional criteria and insurance authorization.  Follow Up Recommendations Skilled nursing-short term rehab (<3 hours/day)      Assistance Recommended at Discharge Set up Supervision/Assistance  Patient can return home with the following  A little help with walking and/or transfers;A little help with bathing/dressing/bathroom    Equipment Recommendations None recommended by PT  Recommendations for Other Services       Functional Status Assessment Patient has had a recent decline in their functional status and demonstrates the ability to make significant improvements in function in a reasonable and predictable amount of time.     Precautions / Restrictions  Precautions Precautions: Fall Precaution Comments: trach Restrictions Weight Bearing Restrictions: No      Mobility  Bed Mobility Overal bed mobility: Needs Assistance             General bed mobility comments: pt sitting EOB upon PT arrival to room    Transfers Overall transfer level: Needs assistance Equipment used: Rolling walker (2 wheels) Transfers: Sit to/from Stand Sit to Stand: Min assist           General transfer comment: light rise assist    Ambulation/Gait Ambulation/Gait assistance: Min guard Gait Distance (Feet): 20 Feet Assistive device: Rolling walker (2 wheels) Gait Pattern/deviations: Step-to pattern, Decreased step length - left, Decreased dorsiflexion - left Gait velocity: decr     General Gait Details: for safety, cues for RW management  Stairs            Wheelchair Mobility    Modified Rankin (Stroke Patients Only)       Balance Overall balance assessment: Needs assistance Sitting-balance support: Feet supported Sitting balance-Leahy Scale: Good     Standing balance support: Bilateral upper extremity supported, During functional activity Standing balance-Leahy Scale: Poor                               Pertinent Vitals/Pain Pain Assessment Pain Assessment: No/denies pain Pain Intervention(s): Monitored during session    Home Living Family/patient expects to be discharged to:: Private residence Living Arrangements: Spouse/significant other Available Help at Discharge:  (girlfriend) Type of Home: Apartment Home Access: Stairs to enter Entrance Stairs-Rails: Doctor, general practice of Steps: 3   Home Layout: One level Home Equipment: Agricultural consultant (2 wheels);Cane - single point;Wheelchair - manual  Prior Function Prior Level of Function : Independent/Modified Independent             Mobility Comments: walks with RW, further distances with WC ADLs Comments: difficult to obtain  accurate PLOF     Hand Dominance   Dominant Hand: Right    Extremity/Trunk Assessment   Upper Extremity Assessment Upper Extremity Assessment: Defer to OT evaluation RUE Deficits / Details: overall WFL for funcitonal tasks assessed RUE Coordination: WNL LUE Deficits / Details: 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 pt is LHD LUE Coordination: decreased fine motor    Lower Extremity Assessment Lower Extremity Assessment: Generalized weakness;LLE deficits/detail LLE Deficits / Details: at least 3/5 throughout, but holds LLE in extensor tone during swing phase of gait, functional weakness    Cervical / Trunk Assessment Cervical / Trunk Assessment: Normal  Communication   Communication: Expressive difficulties;Tracheostomy  Cognition Arousal/Alertness: Awake/alert Behavior During Therapy: WFL for tasks assessed/performed Overall Cognitive Status: Difficult to assess                                 General Comments: followed all commands. pt attempting to communicate through mouthing words but difficult to decipher        General Comments General comments (skin integrity, edema, etc.): PT assisted with suctioning secretions from around trach site x3 during session    Exercises     Assessment/Plan    PT Assessment Patient needs continued PT services  PT Problem List Decreased strength;Decreased mobility;Decreased safety awareness;Decreased activity tolerance;Decreased balance       PT Treatment Interventions DME instruction;Therapeutic activities;Gait training;Therapeutic exercise;Patient/family education;Balance training;Functional mobility training;Neuromuscular re-education    PT Goals (Current goals can be found in the Care Plan section)  Acute Rehab PT Goals Patient Stated Goal: none stated PT Goal Formulation: With patient Time For Goal Achievement: 08/26/22 Potential to Achieve Goals: Good    Frequency Min 2X/week     Co-evaluation                AM-PAC PT "6 Clicks" Mobility  Outcome Measure Help needed turning from your back to your side while in a flat bed without using bedrails?: A Little Help needed moving from lying on your back to sitting on the side of a flat bed without using bedrails?: A Little Help needed moving to and from a bed to a chair (including a wheelchair)?: A Little Help needed standing up from a chair using your arms (e.g., wheelchair or bedside chair)?: A Little Help needed to walk in hospital room?: A Little Help needed climbing 3-5 steps with a railing? : A Little 6 Click Score: 18    End of Session Equipment Utilized During Treatment: Oxygen (via trach, VSS) Activity Tolerance: Patient tolerated treatment well;Patient limited by fatigue Patient left: in bed;with call bell/phone within reach;with bed alarm set Nurse Communication: Mobility status PT Visit Diagnosis: Other abnormalities of gait and mobility (R26.89)    Time: 9326-7124 PT Time Calculation (min) (ACUTE ONLY): 12 min   Charges:   PT Evaluation $PT Eval Low Complexity: 1 Low          Wenzel Backlund S, PT DPT Acute Rehabilitation Services Pager [phone]  Office [phone]   Tyrone Apple E Christain [place] 08/12/2022, 5:16 PM

## 2022-08-12 NOTE — ED Notes (Signed)
Notified RN that lab called and reported that bmp and B12 need recollected

## 2022-08-12 NOTE — [place] Notes (Signed)
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  Patient had a recent ER visit on 07/26/2022 for the same symptoms.  Family also concerned that they are not able to care for him and his tracheostomy needs at home.     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  -Schedule IV reglan.  -vomited this am.  -PRN zofran ordered.  -protonix.   2-Acute tracheostomy management: Continue with trach care.  RT consult.   Seizure disorder: History of seizure following CVA. Continue with Keppra-change to IV. Due to vomiting.   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    Hypokalemia; replete IV.  History of SIADH and frequent episode of hypokalemia  Constipation; fleet enema ordered. Senna.    Estimated body mass index is 25.16 kg/m as calculated from the following:   Height as of this encounter: 5\' 6"  (1.676 m).   Weight as of this encounter: 70.7 kg.   DVT prophylaxis: Lovenox Code Status: Full code Family Communication: Care discussed with patient Disposition Plan:  Status is: Inpatient Remains inpatient appropriate because: vomiting. Needs placement.     Consultants:  None  Procedures:  None  Antimicrobials:    Subjective: He is alert, he vomited per nurse report. He is asking for candy bar.  He denies abdominal  pain.  Objective: Vitals:   08/11/22 2200 08/11/22 2300 08/12/22 0005 08/12/22 0300  BP: (!) 150/77  125/65 (!) 112/58  Pulse: 80 64 66 (!) 59  Resp: (!) 22 (!) 22 (!) 23 11  Temp:   (!) 97.4 F (36.3 C) 97.8 F (36.6 C)  TempSrc:   Axillary   SpO2: 99% 99% 100% 99%  Weight:      Height:        Intake/Output Summary (Last 24 hours) at 08/12/2022 0737 Last data filed at 08/11/2022 2005 Gross per 24 hour  Intake 1000 ml  Output --  Net 1000 ml   Filed Weights   08/11/22 2133  Weight: 70.7 kg    Examination:  General exam: Appears calm and comfortable  Respiratory system: trach in place. Clear to auscultation. Respiratory effort normal. Cardiovascular system: S1 & S2 heard, RRR. No JVD, murmurs, rubs, gallops or clicks. No pedal edema. Gastrointestinal system: Abdomen is nondistended, soft and nontender. No organomegaly or masses felt. Normal bowel sounds heard. Central nervous system: Alert and oriented.  Extremities: Symmetric 5 x 5 power.    Data Reviewed: I have personally reviewed following labs and imaging studies  CBC: Recent Labs  Lab 08/11/22 1241  WBC 3.9*  NEUTROABS 1.3*  HGB 12.2*  HCT 36.8*  MCV 78.6*  PLT [id number]   Basic Metabolic Panel: Recent Labs  Lab 08/11/22 1241  NA 141  K 3.3*  CL 106  CO2 24  GLUCOSE 97  BUN 10  CREATININE 0.98  CALCIUM 9.7   GFR: Estimated Creatinine Clearance: 60.6 mL/min (by C-G formula based on  SCr of 0.98 mg/dL). Liver Function Tests: Recent Labs  Lab 08/11/22 1241  AST 19  ALT 11  ALKPHOS 73  BILITOT 1.0  PROT 7.8  ALBUMIN 4.0   Recent Labs  Lab 08/11/22 1241  LIPASE 34   No results for input(s): "AMMONIA" in the last 168 hours. Coagulation Profile: No results for input(s): "INR", "PROTIME" in the last 168 hours. Cardiac Enzymes: No results for input(s): "CKTOTAL", "CKMB", "CKMBINDEX", "TROPONINI" in the last 168 hours. BNP (last 3 results) No results for input(s): "PROBNP" in the last 8760  hours. HbA1C: No results for input(s): "HGBA1C" in the last 72 hours. CBG: No results for input(s): "GLUCAP" in the last 168 hours. Lipid Profile: No results for input(s): "CHOL", "HDL", "LDLCALC", "TRIG", "CHOLHDL", "LDLDIRECT" in the last 72 hours. Thyroid Function Tests: No results for input(s): "TSH", "T4TOTAL", "FREET4", "T3FREE", "THYROIDAB" in the last 72 hours. Anemia Panel: No results for input(s): "VITAMINB12", "FOLATE", "FERRITIN", "TIBC", "IRON", "RETICCTPCT" in the last 72 hours. Sepsis Labs: No results for input(s): "PROCALCITON", "LATICACIDVEN" in the last 168 hours.  Recent Results (from the past 240 hour(s))  Resp panel by RT-PCR (RSV, Flu A&B, Covid) Anterior Nasal Swab     Status: None   Collection Time: 08/11/22  3:31 PM   Specimen: Anterior Nasal Swab  Result Value Ref Range Status   SARS Coronavirus 2 by RT PCR NEGATIVE NEGATIVE Final    Comment: (NOTE) SARS-CoV-2 target nucleic acids are NOT DETECTED.  The SARS-CoV-2 RNA is generally detectable in upper respiratory specimens during the acute phase of infection. The lowest concentration of SARS-CoV-2 viral copies this assay can detect is 138 copies/mL. A negative result does not preclude SARS-Cov-2 infection and should not be used as the sole basis for treatment or other patient management decisions. A negative result may occur with  improper specimen collection/handling, submission of specimen other than nasopharyngeal swab, presence of viral mutation(s) within the areas targeted by this assay, and inadequate number of viral copies(<138 copies/mL). A negative result must be combined with clinical observations, patient history, and epidemiological information. The expected result is Negative.  Fact Sheet for Patients:  BloggerCourse.com  Fact Sheet for Healthcare Providers:  SeriousBroker.it  This test is no t yet approved or cleared by the [place]  FDA and  has been authorized for detection and/or diagnosis of SARS-CoV-2 by FDA under an Emergency Use Authorization (EUA). This EUA will remain  in effect (meaning this test can be used) for the duration of the COVID-19 declaration under Section 564(b)(1) of the Act, 21 U.S.C.section 360bbb-3(b)(1), unless the authorization is terminated  or revoked sooner.       Influenza A by PCR NEGATIVE NEGATIVE Final   Influenza B by PCR NEGATIVE NEGATIVE Final    Comment: (NOTE) The Xpert Xpress SARS-CoV-2/FLU/RSV plus assay is intended as an aid in the diagnosis of influenza from Nasopharyngeal swab specimens and should not be used as a sole basis for treatment. Nasal washings and aspirates are unacceptable for Xpert Xpress SARS-CoV-2/FLU/RSV testing.  Fact Sheet for Patients: BloggerCourse.com  Fact Sheet for Healthcare Providers: SeriousBroker.it  This test is not yet approved or cleared by the [place] FDA and has been authorized for detection and/or diagnosis of SARS-CoV-2 by FDA under an Emergency Use Authorization (EUA). This EUA will remain in effect (meaning this test can be used) for the duration of the COVID-19 declaration under Section 564(b)(1) of the Act, 21 U.S.C. section 360bbb-3(b)(1), unless the authorization is terminated or revoked.  Resp Syncytial Virus by PCR NEGATIVE NEGATIVE Final    Comment: (NOTE) Fact Sheet for Patients: EntrepreneurPulse.com.au  Fact Sheet for Healthcare Providers: IncredibleEmployment.be  This test is not yet approved or cleared by the [place] FDA and has been authorized for detection and/or diagnosis of SARS-CoV-2 by FDA under an Emergency Use Authorization (EUA). This EUA will remain in effect (meaning this test can be used) for the duration of the COVID-19 declaration under Section 564(b)(1) of the Act, 21 U.S.C. section  360bbb-3(b)(1), unless the authorization is terminated or revoked.  Performed at [facility], [place] [address] [place], [place] [address]          Radiology Studies: US PELVIS LIMITED (TRANSABDOMINAL ONLY)  Result Date: 08/11/2022 CLINICAL DATA:  Urinary retention EXAM: ULTRASOUND OF THE MALE PELVIS COMPARISON:  CT 5:20 p.m. FINDINGS: Bladder: The bladder is partially decompressed and is unremarkable; no intraluminal mass or debris is identified. Bilateral ureteral jets are identified. The prevoid volume is 149 cc. The patient was unable to spontaneously void. Prostate gland:  4.3 x 2.1 x 2.2 cm (volume = 10 cm^3). Seminal vesicles:  Unremarkable IMPRESSION: 1. Unremarkable examination of the urinary bladder. Note, the patient was unable to spontaneously void. Electronically Signed   By: Fidela Salisbury M.D.   On: 08/11/2022 22:45   CT ABDOMEN PELVIS W CONTRAST  Result Date: 08/11/2022 CLINICAL DATA:  Epigastric pain EXAM: CT ABDOMEN AND PELVIS WITH CONTRAST TECHNIQUE: Multidetector CT imaging of the abdomen and pelvis was performed using the standard protocol following bolus administration of intravenous contrast. RADIATION DOSE REDUCTION: This exam was performed according to the departmental dose-optimization program which includes automated exposure control, adjustment of the mA and/or kV according to patient size and/or use of iterative reconstruction technique. CONTRAST:  22mL OMNIPAQUE IOHEXOL 350 MG/ML SOLN COMPARISON:  CT examination dated April 22, 2021 FINDINGS: Lower chest: Large hiatal hernia.  Bibasilar atelectasis. Hepatobiliary: No focal liver abnormality is seen. No gallstones, gallbladder wall thickening, or biliary dilatation. Pancreas: Unremarkable. No pancreatic ductal dilatation or surrounding inflammatory changes. Spleen: Normal in size without focal abnormality. Adrenals/Urinary Tract: Adrenal glands are unremarkable. Bilateral simple renal cysts. Kidneys are normal,  without renal calculi, focal lesion, or hydronephrosis. Bladder is unremarkable. Stomach/Bowel: Stomach is within normal limits. Appendix appears normal. No evidence of bowel wall thickening, distention, or inflammatory changes. Moderate amount of retained colonic stool suggesting constipation. Vascular/Lymphatic: No significant vascular findings are present. No enlarged abdominal or pelvic lymph nodes. Reproductive: Prostate is unremarkable. Other: No abdominal wall hernia or abnormality. No abdominopelvic ascites. Musculoskeletal: No acute or significant osseous findings. IMPRESSION: 1. No CT evidence of acute abdominal/pelvic process. 2. Large hiatal hernia. 3. Moderate amount of retained colonic stool suggesting constipation. No evidence of bowel obstruction. 4. Bilateral simple renal cysts. No evidence of nephrolithiasis or hydronephrosis. Electronically Signed   By: Keane Police D.O.   On: 08/11/2022 17:44   DG Chest Portable 1 View  Result Date: 08/11/2022 CLINICAL DATA:  Vomiting, evaluate for aspiration EXAM: PORTABLE CHEST 1 VIEW COMPARISON:  07/26/2022 FINDINGS: Transverse diameter of heart is increased. Thoracic aorta is tortuous. Lung fields are clear of any infiltrates or pulmonary edema. There is no pleural effusion or pneumothorax. Tip of tracheostomy is 3.2 cm above the carina. IMPRESSION: No active disease. Electronically Signed   By: Elmer Picker M.D.   On: 08/11/2022 11:21        Scheduled Meds:  amLODipine  10 mg Oral Daily   chlorproMAZINE  25 mg Oral TID   enoxaparin (LOVENOX) injection  40 mg Subcutaneous Q24H   ferrous sulfate  325 mg Oral Q breakfast   finasteride  5 mg Oral Daily   folic acid  1 mg Oral Daily   levETIRAcetam  1,000 mg Oral BID   lisinopril  5 mg Oral Daily   magnesium oxide  200 mg Oral Daily   metoCLOPramide  5 mg Oral TID AC   metoprolol tartrate  50 mg Oral BID   pantoprazole  40 mg Oral Daily   senna  1 tablet Oral BID   sodium chloride  flush  3 mL Intravenous Q12H   sodium chloride  1 g Oral TID   sodium phosphate  1 enema Rectal Once   tamsulosin  0.4 mg Oral QPC supper   thiamine  100 mg Oral Daily   traZODone  50 mg Oral QHS   Continuous Infusions:  sodium chloride       LOS: 1 day    Time spent: 35 minutes    Salil Raineri A Maahi Lannan, MD [facility]   If 7PM-7AM, please contact night-coverage [email or web address]  08/12/2022, 7:37 AM

## 2022-08-13 ENCOUNTER — Inpatient Hospital Stay (HOSPITAL_COMMUNITY): Payer: Medicare Other

## 2022-08-13 DIAGNOSIS — R1110 Vomiting, unspecified: Secondary | ICD-10-CM | POA: Diagnosis not present

## 2022-08-13 DIAGNOSIS — R1115 Cyclical vomiting syndrome unrelated to migraine: Secondary | ICD-10-CM | POA: Diagnosis not present

## 2022-08-13 DIAGNOSIS — K222 Esophageal obstruction: Secondary | ICD-10-CM | POA: Diagnosis not present

## 2022-08-13 DIAGNOSIS — R1310 Dysphagia, unspecified: Secondary | ICD-10-CM

## 2022-08-13 LAB — COMPREHENSIVE METABOLIC PANEL
ALT: 7 U/L (ref 0–44)
AST: 15 U/L (ref 15–41)
Albumin: 2.8 g/dL — ABNORMAL LOW (ref 3.5–5.0)
Alkaline Phosphatase: 49 U/L (ref 38–126)
Anion gap: 7 (ref 5–15)
BUN: 6 mg/dL — ABNORMAL LOW (ref 8–23)
CO2: 20 mmol/L — ABNORMAL LOW (ref 22–32)
Calcium: 8.1 mg/dL — ABNORMAL LOW (ref 8.9–10.3)
Chloride: 113 mmol/L — ABNORMAL HIGH (ref 98–111)
Creatinine, Ser: 1.03 mg/dL (ref 0.61–1.24)
GFR, Estimated: >60 mL/min (ref >60)
Glucose, Bld: 85 mg/dL (ref 70–99)
Potassium: 3.4 mmol/L — ABNORMAL LOW (ref 3.5–5.1)
Sodium: 140 mmol/L (ref 135–145)
Total Bilirubin: 0.8 mg/dL (ref 0.3–1.2)
Total Protein: 5.5 g/dL — ABNORMAL LOW (ref 6.5–8.1)

## 2022-08-13 LAB — CBC
HCT: 31.9 % — ABNORMAL LOW (ref 39.0–52.0)
Hemoglobin: 10.1 g/dL — ABNORMAL LOW (ref 13.0–17.0)
MCH: 25.6 pg — ABNORMAL LOW (ref 26.0–34.0)
MCHC: 31.7 g/dL (ref 30.0–36.0)
MCV: 81 fL (ref 80.0–100.0)
Platelets: 190 10*3/uL (ref 150–400)
RBC: 3.94 MIL/uL — ABNORMAL LOW (ref 4.22–5.81)
RDW: 18.3 % — ABNORMAL HIGH (ref 11.5–15.5)
WBC: 3.7 10*3/uL — ABNORMAL LOW (ref 4.0–10.5)
nRBC: 0 % (ref 0.0–0.2)

## 2022-08-13 LAB — CORTISOL: Cortisol, Plasma: 3 ug/dL

## 2022-08-13 MED ORDER — CHLORPROMAZINE HCL 25 MG PO TABS
25.0000 mg | ORAL_TABLET | Freq: Three times a day (TID) | ORAL | Status: DC | PRN
  Administered 2022-08-13 – 2022-08-31 (×7): 25 mg via ORAL
  Filled 2022-08-13 (×8): qty 1

## 2022-08-13 MED ORDER — ENOXAPARIN SODIUM 40 MG/0.4ML IJ SOSY
40.0000 mg | PREFILLED_SYRINGE | INTRAMUSCULAR | Status: DC
  Administered 2022-08-14 – 2022-09-02 (×20): 40 mg via SUBCUTANEOUS
  Filled 2022-08-13 (×20): qty 0.4

## 2022-08-13 MED ORDER — SODIUM CHLORIDE 0.9 % IV BOLUS
500.0000 mL | Freq: Once | INTRAVENOUS | Status: AC
  Administered 2022-08-13: 500 mL via INTRAVENOUS

## 2022-08-13 MED ORDER — FLEET ENEMA 7-19 GM/118ML RE ENEM
1.0000 | ENEMA | Freq: Once | RECTAL | Status: AC
  Administered 2022-08-13: 1 via RECTAL
  Filled 2022-08-13: qty 1

## 2022-08-13 MED ORDER — POTASSIUM CHLORIDE 10 MEQ/100ML IV SOLN
10.0000 meq | INTRAVENOUS | Status: AC
  Administered 2022-08-13: 10 meq via INTRAVENOUS
  Filled 2022-08-13: qty 100

## 2022-08-13 MED ORDER — PANTOPRAZOLE SODIUM 40 MG IV SOLR
40.0000 mg | Freq: Two times a day (BID) | INTRAVENOUS | Status: DC
  Administered 2022-08-13 – 2022-08-18 (×10): 40 mg via INTRAVENOUS
  Filled 2022-08-13 (×10): qty 10

## 2022-08-13 NOTE — [place] Notes (Signed)
Patient on clear liquid diet until midnight 08/14/2022 pending a speech consult. Patient tolerated a clear liquid diet well today, no vomiting.  Urine output was 450 cc today and he did vomit after PO medication given 350 cc.

## 2022-08-13 NOTE — [place] Notes (Addendum)
Initial Nutrition Assessment  DOCUMENTATION CODES:   Not applicable  INTERVENTION:  - Advance diet as tolerated.   NUTRITION DIAGNOSIS:   Inadequate oral intake related to inability to eat, nausea, vomiting as evidenced by per patient/family report, NPO status.  GOAL:   Patient will meet greater than or equal to 90% of their needs  MONITOR:   PO intake, Supplement acceptance, Diet advancement  REASON FOR ASSESSMENT:   Malnutrition Screening Tool    ASSESSMENT:   71 y.o. male admits related to vomiting. PMH includes: acid reflux, CVA, gout, HTN, laryngeal stenosis, seizures, urinary retention. Pt is currently receiving medical management for recurrent nausea/vomiting.  Meds reviewed: folvite, mag-ox, reglan, senokot, sodium chloride, sodium phosphate, thiamine. Labs reviewed: K low.   RD attempted to see pt 2x. Pt was being seen by another provider and then out of the room. Pt is currently NPO for gastric emptying study today. Pt with trach collar. No recent weights in EMR. Last recorded wt was in 2021. Pt has lost 28 lbs over the past 2 years.   RD recommends advancing diet as tolerated. Will add supplements once diet is advanced.   NUTRITION - FOCUSED PHYSICAL EXAM:  Will attempt at follow up.   Diet Order:   Diet Order             Diet clear liquid Room service appropriate? Yes; Fluid consistency: Thin  Diet effective now                   EDUCATION NEEDS:   Not appropriate for education at this time  Skin:  Skin Assessment: Reviewed RN Assessment  Last BM:  08/11/22  Height:   Ht Readings from Last 1 Encounters:  08/12/22 5\' 6"  (1.676 m)    Weight:   Wt Readings from Last 1 Encounters:  08/12/22 57.6 kg    Ideal Body Weight:     BMI:  Body mass index is 20.5 kg/m.  Estimated Nutritional Needs:   Kcal:  1440-1725 kcals  Protein:  70-85 gm  Fluid:  >/= 1.4 L  08/14/22, RD, LDN, CNSC.

## 2022-08-13 NOTE — Progress Notes (Addendum)
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  Patient had a recent ER visit on 07/26/2022 for the same symptoms.  Family also concerned that they are not able to care for him and his tracheostomy needs at home.   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  -CT abdomen pelvis: No CT evidence of acute. Moderate amount of retained colonic stool suggesting constipation. No evidence of bowel obstruction. -Schedule IV reglan. Change thorazine to PRN -PRN zofran ordered.  -IV protonix.  -Continue to vomit, will change diet to NPO. For gastric empty study today. Will consult GI>  -Check Cortisol level.   2-Hypotension; Related to hypovolemia.  Increase IV fluids.  IV bolus.  Hold BP medications.   Acute tracheostomy management: Continue with trach care.  RT consult.   Seizure disorder: History of seizure following CVA. 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  Bladder scan every shift.    Hypokalemia; Replete IV.  History of SIADH and frequent episode of hypokalemia  Constipation; didn't received fleet enema yesterday. Will re order. Started on senna.    Estimated body mass index is 20.5 kg/m as calculated from the following:   Height as of this encounter: 5\' 6"  (1.676 m).   Weight as of this  encounter: 57.6 kg.   DVT prophylaxis: Lovenox Code Status: Full code Family Communication: Care discussed with patient and wife over phone  Disposition Plan:  Status is: Inpatient Remains inpatient appropriate because: vomiting. Needs placement.     Consultants:  None  Procedures:  None  Antimicrobials:    Subjective: He denies dyspnea.  Continue to have persistent vomiting. Report small BM yesterday. Denies abdominal pain. He has been vomiting for 2-3 weeks per wife.   Objective: Vitals:   08/12/22 1932 08/12/22 2200 08/13/22 0300 08/13/22 0354  BP: (!) 145/70   (!) 83/46  Pulse: 68 62 (!) 53 (!) 51  Resp: 18 16 16 16   Temp: 97.9 F (36.6 C)   (!) 97.5 F (36.4 C)  TempSrc: Oral   Oral  SpO2: 97% 96% 96% 99%  Weight:      Height:        Intake/Output Summary (Last 24 hours) at 08/13/2022 0744 Last data filed at 08/13/2022 0740 Gross per 24 hour  Intake 1911.32 ml  Output 580 ml  Net 1331.32 ml    Filed Weights   08/11/22 2133 08/12/22 1157  Weight: 70.7 kg 57.6 kg    Examination:  General exam: NAD Respiratory system: trach in place. CTA Cardiovascular system: S 1, S 2 RRR Gastrointestinal system: BS present, soft, nt Central nervous system: alert, follows command Extremities: no edema    Data Reviewed: I have personally reviewed following labs and imaging studies  CBC: Recent Labs  Lab 08/11/22 1241 08/12/22 0827 08/13/22 0254  WBC 3.9* 4.3 3.7*  NEUTROABS 1.3*  --   --   HGB  12.2* 10.8* 10.1*  HCT 36.8* 32.9* 31.9*  MCV 78.6* 78.9* 81.0  PLT 242 226 [id number]    Basic Metabolic Panel: Recent Labs  Lab 08/11/22 1241 08/12/22 1231 08/13/22 0254  NA 141 138 140  K 3.3* 3.4* 3.4*  CL 106 106 113*  CO2 24 21* 20*  GLUCOSE 97 91 85  BUN 10 7* 6*  CREATININE 0.98 1.03 1.03  CALCIUM 9.7 8.8* 8.1*    GFR: Estimated Creatinine Clearance: 52 mL/min (by C-G formula based on SCr of 1.03 mg/dL). Liver Function Tests: Recent Labs  Lab  08/11/22 1241 08/13/22 0254  AST 19 15  ALT 11 7  ALKPHOS 73 49  BILITOT 1.0 0.8  PROT 7.8 5.5*  ALBUMIN 4.0 2.8*    Recent Labs  Lab 08/11/22 1241  LIPASE 34    No results for input(s): "AMMONIA" in the last 168 hours. Coagulation Profile: No results for input(s): "INR", "PROTIME" in the last 168 hours. Cardiac Enzymes: No results for input(s): "CKTOTAL", "CKMB", "CKMBINDEX", "TROPONINI" in the last 168 hours. BNP (last 3 results) No results for input(s): "PROBNP" in the last 8760 hours. HbA1C: No results for input(s): "HGBA1C" in the last 72 hours. CBG: No results for input(s): "GLUCAP" in the last 168 hours. Lipid Profile: No results for input(s): "CHOL", "HDL", "LDLCALC", "TRIG", "CHOLHDL", "LDLDIRECT" in the last 72 hours. Thyroid Function Tests: No results for input(s): "TSH", "T4TOTAL", "FREET4", "T3FREE", "THYROIDAB" in the last 72 hours. Anemia Panel: Recent Labs    08/12/22 1231  VITAMINB12 443   Sepsis Labs: No results for input(s): "PROCALCITON", "LATICACIDVEN" in the last 168 hours.  Recent Results (from the past 240 hour(s))  Resp panel by RT-PCR (RSV, Flu A&B, Covid) Anterior Nasal Swab     Status: None   Collection Time: 08/11/22  3:31 PM   Specimen: Anterior Nasal Swab  Result Value Ref Range Status   SARS Coronavirus 2 by RT PCR NEGATIVE NEGATIVE Final    Comment: (NOTE) SARS-CoV-2 target nucleic acids are NOT DETECTED.  The SARS-CoV-2 RNA is generally detectable in upper respiratory specimens during the acute phase of infection. The lowest concentration of SARS-CoV-2 viral copies this assay can detect is 138 copies/mL. A negative result does not preclude SARS-Cov-2 infection and should not be used as the sole basis for treatment or other patient management decisions. A negative result may occur with  improper specimen collection/handling, submission of specimen other than nasopharyngeal swab, presence of viral mutation(s) within the areas  targeted by this assay, and inadequate number of viral copies(<138 copies/mL). A negative result must be combined with clinical observations, patient history, and epidemiological information. The expected result is Negative.  Fact Sheet for Patients:  EntrepreneurPulse.com.au  Fact Sheet for Healthcare Providers:  IncredibleEmployment.be  This test is no t yet approved or cleared by the [place] FDA and  has been authorized for detection and/or diagnosis of SARS-CoV-2 by FDA under an Emergency Use Authorization (EUA). This EUA will remain  in effect (meaning this test can be used) for the duration of the COVID-19 declaration under Section 564(b)(1) of the Act, 21 U.S.C.section 360bbb-3(b)(1), unless the authorization is terminated  or revoked sooner.       Influenza A by PCR NEGATIVE NEGATIVE Final   Influenza B by PCR NEGATIVE NEGATIVE Final    Comment: (NOTE) The Xpert Xpress SARS-CoV-2/FLU/RSV plus assay is intended as an aid in the diagnosis of influenza from Nasopharyngeal swab specimens and should not be used as a sole basis  for treatment. Nasal washings and aspirates are unacceptable for Xpert Xpress SARS-CoV-2/FLU/RSV testing.  Fact Sheet for Patients: EntrepreneurPulse.com.au  Fact Sheet for Healthcare Providers: IncredibleEmployment.be  This test is not yet approved or cleared by the [place] FDA and has been authorized for detection and/or diagnosis of SARS-CoV-2 by FDA under an Emergency Use Authorization (EUA). This EUA will remain in effect (meaning this test can be used) for the duration of the COVID-19 declaration under Section 564(b)(1) of the Act, 21 U.S.C. section 360bbb-3(b)(1), unless the authorization is terminated or revoked.     Resp Syncytial Virus by PCR NEGATIVE NEGATIVE Final    Comment: (NOTE) Fact Sheet for  Patients: EntrepreneurPulse.com.au  Fact Sheet for Healthcare Providers: IncredibleEmployment.be  This test is not yet approved or cleared by the [place] FDA and has been authorized for detection and/or diagnosis of SARS-CoV-2 by FDA under an Emergency Use Authorization (EUA). This EUA will remain in effect (meaning this test can be used) for the duration of the COVID-19 declaration under Section 564(b)(1) of the Act, 21 U.S.C. section 360bbb-3(b)(1), unless the authorization is terminated or revoked.  Performed at [facility], [place] [address], [place], [place] [address]          Radiology Studies: US PELVIS LIMITED (TRANSABDOMINAL ONLY)  Result Date: 08/11/2022 CLINICAL DATA:  Urinary retention EXAM: ULTRASOUND OF THE MALE PELVIS COMPARISON:  CT 5:20 p.m. FINDINGS: Bladder: The bladder is partially decompressed and is unremarkable; no intraluminal mass or debris is identified. Bilateral ureteral jets are identified. The prevoid volume is 149 cc. The patient was unable to spontaneously void. Prostate gland:  4.3 x 2.1 x 2.2 cm (volume = 10 cm^3). Seminal vesicles:  Unremarkable IMPRESSION: 1. Unremarkable examination of the urinary bladder. Note, the patient was unable to spontaneously void. Electronically Signed   By: Fidela Salisbury M.D.   On: 08/11/2022 22:45   CT ABDOMEN PELVIS W CONTRAST  Result Date: 08/11/2022 CLINICAL DATA:  Epigastric pain EXAM: CT ABDOMEN AND PELVIS WITH CONTRAST TECHNIQUE: Multidetector CT imaging of the abdomen and pelvis was performed using the standard protocol following bolus administration of intravenous contrast. RADIATION DOSE REDUCTION: This exam was performed according to the departmental dose-optimization program which includes automated exposure control, adjustment of the mA and/or kV according to patient size and/or use of iterative reconstruction technique. CONTRAST:  11mL OMNIPAQUE IOHEXOL 350  MG/ML SOLN COMPARISON:  CT examination dated April 22, 2021 FINDINGS: Lower chest: Large hiatal hernia.  Bibasilar atelectasis. Hepatobiliary: No focal liver abnormality is seen. No gallstones, gallbladder wall thickening, or biliary dilatation. Pancreas: Unremarkable. No pancreatic ductal dilatation or surrounding inflammatory changes. Spleen: Normal in size without focal abnormality. Adrenals/Urinary Tract: Adrenal glands are unremarkable. Bilateral simple renal cysts. Kidneys are normal, without renal calculi, focal lesion, or hydronephrosis. Bladder is unremarkable. Stomach/Bowel: Stomach is within normal limits. Appendix appears normal. No evidence of bowel wall thickening, distention, or inflammatory changes. Moderate amount of retained colonic stool suggesting constipation. Vascular/Lymphatic: No significant vascular findings are present. No enlarged abdominal or pelvic lymph nodes. Reproductive: Prostate is unremarkable. Other: No abdominal wall hernia or abnormality. No abdominopelvic ascites. Musculoskeletal: No acute or significant osseous findings. IMPRESSION: 1. No CT evidence of acute abdominal/pelvic process. 2. Large hiatal hernia. 3. Moderate amount of retained colonic stool suggesting constipation. No evidence of bowel obstruction. 4. Bilateral simple renal cysts. No evidence of nephrolithiasis or hydronephrosis. Electronically Signed   By: Keane Police D.O.   On: 08/11/2022 17:44   DG  Chest Portable 1 View  Result Date: 08/11/2022 CLINICAL DATA:  Vomiting, evaluate for aspiration EXAM: PORTABLE CHEST 1 VIEW COMPARISON:  07/26/2022 FINDINGS: Transverse diameter of heart is increased. Thoracic aorta is tortuous. Lung fields are clear of any infiltrates or pulmonary edema. There is no pleural effusion or pneumothorax. Tip of tracheostomy is 3.2 cm above the carina. IMPRESSION: No active disease. Electronically Signed   By: Elmer Picker M.D.   On: 08/11/2022 11:21        Scheduled  Meds:  chlorproMAZINE  25 mg Oral TID   enoxaparin (LOVENOX) injection  40 mg Subcutaneous Q24H   ferrous sulfate  325 mg Oral Q breakfast   finasteride  5 mg Oral Daily   folic acid  1 mg Oral Daily   magnesium oxide  200 mg Oral Daily   metoCLOPramide (REGLAN) injection  5 mg Intravenous Q8H   pantoprazole  40 mg Oral Daily   senna  1 tablet Oral BID   sodium chloride flush  3 mL Intravenous Q12H   sodium chloride  1 g Oral TID   sodium phosphate  1 enema Rectal Once   tamsulosin  0.4 mg Oral QPC supper   thiamine  100 mg Oral Daily   traZODone  50 mg Oral QHS   Continuous Infusions:  sodium chloride 250 mL (08/13/22 0545)   lactated ringers 75 mL/hr at 08/12/22 1806   levETIRAcetam 1,000 mg (08/12/22 2032)   potassium chloride     sodium chloride       LOS: 2 days    Time spent: 35 minutes    Rafiel Mecca A Romelo Sciandra, MD [facility]   If 7PM-7AM, please contact night-coverage [email or web address]  08/13/2022, 7:44 AM
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
## 2022-08-14 ENCOUNTER — Encounter (HOSPITAL_COMMUNITY): Payer: Self-pay | Admitting: Internal Medicine

## 2022-08-14 ENCOUNTER — Inpatient Hospital Stay (HOSPITAL_COMMUNITY): Payer: Medicare Other | Admitting: Anesthesiology

## 2022-08-14 ENCOUNTER — Encounter (HOSPITAL_COMMUNITY)
Admission: EM | Disposition: A | Discharge status: Skilled Nursing Facility | Payer: Self-pay | Source: Home / Self Care | Attending: Internal Medicine

## 2022-08-14 ENCOUNTER — Inpatient Hospital Stay (HOSPITAL_COMMUNITY): Payer: Medicare Other

## 2022-08-14 DIAGNOSIS — I10 Essential (primary) hypertension: Secondary | ICD-10-CM

## 2022-08-14 DIAGNOSIS — Z8673 Personal history of transient ischemic attack (TIA), and cerebral infarction without residual deficits: Secondary | ICD-10-CM | POA: Diagnosis not present

## 2022-08-14 DIAGNOSIS — K222 Esophageal obstruction: Secondary | ICD-10-CM | POA: Diagnosis not present

## 2022-08-14 DIAGNOSIS — R1310 Dysphagia, unspecified: Secondary | ICD-10-CM | POA: Diagnosis not present

## 2022-08-14 HISTORY — PX: ESOPHAGEAL DILATION: SHX303

## 2022-08-14 HISTORY — PX: ESOPHAGOGASTRODUODENOSCOPY (EGD) WITH PROPOFOL: SHX5813

## 2022-08-14 LAB — CBC
HCT: 30.6 % — ABNORMAL LOW (ref 39.0–52.0)
Hemoglobin: 10 g/dL — ABNORMAL LOW (ref 13.0–17.0)
MCH: 26.1 pg (ref 26.0–34.0)
MCHC: 32.7 g/dL (ref 30.0–36.0)
MCV: 79.9 fL — ABNORMAL LOW (ref 80.0–100.0)
Platelets: 190 10*3/uL (ref 150–400)
RBC: 3.83 MIL/uL — ABNORMAL LOW (ref 4.22–5.81)
RDW: 18.2 % — ABNORMAL HIGH (ref 11.5–15.5)
WBC: 3.1 10*3/uL — ABNORMAL LOW (ref 4.0–10.5)
nRBC: 0 % (ref 0.0–0.2)

## 2022-08-14 LAB — BASIC METABOLIC PANEL
Anion gap: 8 (ref 5–15)
BUN: <5 mg/dL — ABNORMAL LOW (ref 8–23)
CO2: 21 mmol/L — ABNORMAL LOW (ref 22–32)
Calcium: 8.2 mg/dL — ABNORMAL LOW (ref 8.9–10.3)
Chloride: 110 mmol/L (ref 98–111)
Creatinine, Ser: 0.84 mg/dL (ref 0.61–1.24)
GFR, Estimated: >60 mL/min (ref >60)
Glucose, Bld: 78 mg/dL (ref 70–99)
Potassium: 3.7 mmol/L (ref 3.5–5.1)
Sodium: 139 mmol/L (ref 135–145)

## 2022-08-14 LAB — SURGICAL PCR SCREEN
MRSA, PCR: POSITIVE — AB
Staphylococcus aureus: POSITIVE — AB

## 2022-08-14 SURGERY — ESOPHAGOGASTRODUODENOSCOPY (EGD) WITH PROPOFOL
Anesthesia: Monitor Anesthesia Care

## 2022-08-14 MED ORDER — LACTATED RINGERS IV SOLN: INTRAVENOUS | Status: DC

## 2022-08-14 MED ORDER — PROPOFOL 500 MG/50ML IV EMUL
INTRAVENOUS | Status: DC | PRN
  Administered 2022-08-14: 150 ug/kg/min via INTRAVENOUS

## 2022-08-14 MED ORDER — PHENYLEPHRINE 80 MCG/ML (10ML) SYRINGE FOR IV PUSH (FOR BLOOD PRESSURE SUPPORT)
PREFILLED_SYRINGE | INTRAVENOUS | Status: DC | PRN
  Administered 2022-08-14 (×2): 160 ug via INTRAVENOUS

## 2022-08-14 SURGICAL SUPPLY — 15 items

## 2022-08-14 NOTE — Transfer of Care (Signed)
Immediate Anesthesia Transfer of Care Note  Patient: Evan Jacobs  Procedure(s) Performed: ESOPHAGOGASTRODUODENOSCOPY (EGD) WITH PROPOFOL ESOPHAGEAL DILATION  Patient Location: PACU  Anesthesia Type:MAC  Level of Consciousness: drowsy  Airway & Oxygen Therapy: Patient Spontanous Breathing and Patient connected to tracheostomy mask oxygen  Post-op Assessment: Report given to RN and Post -op Vital signs reviewed and stable  Post vital signs: Reviewed and stable  Last Vitals:  Vitals Value Taken Time  BP 89/57 08/14/22 1035  Temp    Pulse 53 08/14/22 1037  Resp 18 08/14/22 1037  SpO2 97 % 08/14/22 1037  Vitals shown include unvalidated device data.  Last Pain:  Vitals:   08/14/22 1035  TempSrc:   PainSc: Asleep      Patients Stated Pain Goal: 3 (58/09/98 3382)  Complications: No notable events documented.

## 2022-08-14 NOTE — Anesthesia Procedure Notes (Signed)
Procedure Name: MAC Date/Time: 08/14/2022 10:17 AM  Performed by: Lieutenant Diego, CRNAPre-anesthesia Checklist: Emergency Drugs available, Patient identified, Suction available, Patient being monitored and Timeout performed Patient Re-evaluated:Patient Re-evaluated prior to induction Oxygen Delivery Method: Nasal cannula Preoxygenation: Pre-oxygenation with 100% oxygen Induction Type: IV induction Airway Equipment and Method: Tracheostomy Comments: Trach collar O2 provided.

## 2022-08-14 NOTE — Progress Notes (Signed)
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  Patient had a recent ER visit on 07/26/2022 for the same symptoms.  Family also concerned that they are not able to care for him and his tracheostomy needs at home.   Assessment & Plan:   Principal Problem:   Vomiting Active Problems:   Seizures (HCC)   Acute tracheostomy management (HCC)   Hypokalemia   Acute on chronic urinary retention   Esophageal stricture   Dysphagia  1-Recurrent  nausea and vomiting: -Gastric Empty study ordered.  Patient was not able to complete test.  -CT abdomen pelvis: No CT evidence of acute. Moderate amount of retained colonic stool suggesting constipation. No evidence of bowel obstruction. -Schedule IV reglan. Change thorazine to PRN -PRN zofran ordered.  -IV protonix.  -Cortisol level. 3-- will do ACTH test tomorrow.  -Endoscopy: 2 cm hiatal hernia, benign-appearing severe esophageal stenosis.  Dilated to 9 mm with appropriate mucosal wrents.  No further dilation due to finding an ulceration at the site. -GI is recommending full liquid diet, patient should not to be on solid fluid until the stricture can be dilated further.   2-Hypotension; Related to hypovolemia.  Increase IV fluids.  IV bolus.  Hold BP medications.  Improved.   Acute tracheostomy management: Continue with trach care.  RT consult.   Seizure disorder: History of seizure following CVA. 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  Bladder scan every shift.    Hypokalemia; Replaced  History of SIADH and frequent episode of hypokalemia  Constipation;  on senna.    Estimated body mass index is 20.5 kg/m as calculated from the following:   Height as of this encounter: 5\' 6"  (1.676 m).   Weight as of this encounter: 57.6 kg.   DVT prophylaxis: Lovenox Code Status: Full code Family Communication: Care discussed with patient and wife over phone  Disposition Plan:  Status is: Inpatient Remains inpatient appropriate because: vomiting. Needs placement.     Consultants:  None  Procedures:  None  Antimicrobials:    Subjective: He did ok with clear liquid diet.  Denies pain.  Denies dyspnea.    Objective: Vitals:   08/13/22 2100 08/14/22 0350 08/14/22 0355 08/14/22 0810  BP:  119/72  138/68  Pulse: 62 (!) 54 (!) 56 62  Resp: 18 19 20 14   Temp:  (!) 97.5 F (36.4 C)  98 F (36.7 C)  TempSrc:  Oral  Oral  SpO2: 100% 99% 99% 100%  Weight:      Height:        Intake/Output Summary (Last 24 hours) at 08/14/2022 0919 Last data filed at 08/13/2022 2127 Gross per 24 hour  Intake 866 ml  Output 300 ml  Net 566 ml    Filed Weights   08/11/22 2133 08/12/22 1157  Weight: 70.7 kg 57.6 kg    Examination:  General exam: NAD Respiratory system: Trach in place, CTA Cardiovascular system: S 1, S 2 RRR Gastrointestinal system: BS Present, soft nt Central nervous system: Alert, follows comand Extremities: no edema    Data Reviewed: I have personally reviewed following labs  and imaging studies  CBC: Recent Labs  Lab 08/11/22 1241 08/12/22 0827 08/13/22 0254 08/14/22 0506  WBC 3.9* 4.3 3.7* 3.1*  NEUTROABS 1.3*  --   --   --   HGB 12.2* 10.8* 10.1* 10.0*  HCT 36.8* 32.9* 31.9* 30.6*  MCV 78.6* 78.9* 81.0 79.9*  PLT 242 226 190 190    Basic Metabolic Panel: Recent Labs  Lab 08/11/22 1241 08/12/22 1231 08/13/22 0254 08/14/22 0506  NA 141 138 140 139  K 3.3* 3.4* 3.4* 3.7  CL 106 106 113* 110  CO2 24 21*  20* 21*  GLUCOSE 97 91 85 78  BUN 10 7* 6* <5*  CREATININE 0.98 1.03 1.03 0.84  CALCIUM 9.7 8.8* 8.1* 8.2*    GFR: Estimated Creatinine Clearance: 63.8 mL/min (by C-G formula based on SCr of 0.84 mg/dL). Liver Function Tests: Recent Labs  Lab 08/11/22 1241 08/13/22 0254  AST 19 15  ALT 11 7  ALKPHOS 73 49  BILITOT 1.0 0.8  PROT 7.8 5.5*  ALBUMIN 4.0 2.8*    Recent Labs  Lab 08/11/22 1241  LIPASE 34    No results for input(s): "AMMONIA" in the last 168 hours. Coagulation Profile: No results for input(s): "INR", "PROTIME" in the last 168 hours. Cardiac Enzymes: No results for input(s): "CKTOTAL", "CKMB", "CKMBINDEX", "TROPONINI" in the last 168 hours. BNP (last 3 results) No results for input(s): "PROBNP" in the last 8760 hours. HbA1C: No results for input(s): "HGBA1C" in the last 72 hours. CBG: No results for input(s): "GLUCAP" in the last 168 hours. Lipid Profile: No results for input(s): "CHOL", "HDL", "LDLCALC", "TRIG", "CHOLHDL", "LDLDIRECT" in the last 72 hours. Thyroid Function Tests: No results for input(s): "TSH", "T4TOTAL", "FREET4", "T3FREE", "THYROIDAB" in the last 72 hours. Anemia Panel: Recent Labs    08/12/22 1231  VITAMINB12 443    Sepsis Labs: No results for input(s): "PROCALCITON", "LATICACIDVEN" in the last 168 hours.  Recent Results (from the past 240 hour(s))  Resp panel by RT-PCR (RSV, Flu A&B, Covid) Anterior Nasal Swab     Status: None   Collection Time: 08/11/22  3:31 PM   Specimen: Anterior Nasal Swab  Result Value Ref Range Status   SARS Coronavirus 2 by RT PCR NEGATIVE NEGATIVE Final    Comment: (NOTE) SARS-CoV-2 target nucleic acids are NOT DETECTED.  The SARS-CoV-2 RNA is generally detectable in upper respiratory specimens during the acute phase of infection. The lowest concentration of SARS-CoV-2 viral copies this assay can detect is 138 copies/mL. A negative result does not preclude SARS-Cov-2 infection and should not be  used as the sole basis for treatment or other patient management decisions. A negative result may occur with  improper specimen collection/handling, submission of specimen other than nasopharyngeal swab, presence of viral mutation(s) within the areas targeted by this assay, and inadequate number of viral copies(<138 copies/mL). A negative result must be combined with clinical observations, patient history, and epidemiological information. The expected result is Negative.  Fact Sheet for Patients:  BloggerCourse.comhttps://www.fda.gov/media/152166/download  Fact Sheet for Healthcare Providers:  SeriousBroker.ithttps://www.fda.gov/media/152162/download  This test is no t yet approved or cleared by the [place] FDA and  has been authorized for detection and/or diagnosis of SARS-CoV-2 by FDA under an Emergency Use Authorization (EUA). This EUA will remain  in effect (meaning this test can be used) for the duration of the COVID-19 declaration under Section 564(b)(1) of the Act, 21 U.S.C.section 360bbb-3(b)(1), unless the authorization is terminated  or revoked sooner.  Influenza A by PCR NEGATIVE NEGATIVE Final   Influenza B by PCR NEGATIVE NEGATIVE Final    Comment: (NOTE) The Xpert Xpress SARS-CoV-2/FLU/RSV plus assay is intended as an aid in the diagnosis of influenza from Nasopharyngeal swab specimens and should not be used as a sole basis for treatment. Nasal washings and aspirates are unacceptable for Xpert Xpress SARS-CoV-2/FLU/RSV testing.  Fact Sheet for Patients: BloggerCourse.com  Fact Sheet for Healthcare Providers: SeriousBroker.it  This test is not yet approved or cleared by the [place] FDA and has been authorized for detection and/or diagnosis of SARS-CoV-2 by FDA under an Emergency Use Authorization (EUA). This EUA will remain in effect (meaning this test can be used) for the duration of the COVID-19 declaration under Section  564(b)(1) of the Act, 21 U.S.C. section 360bbb-3(b)(1), unless the authorization is terminated or revoked.     Resp Syncytial Virus by PCR NEGATIVE NEGATIVE Final    Comment: (NOTE) Fact Sheet for Patients: BloggerCourse.com  Fact Sheet for Healthcare Providers: SeriousBroker.it  This test is not yet approved or cleared by the [place] FDA and has been authorized for detection and/or diagnosis of SARS-CoV-2 by FDA under an Emergency Use Authorization (EUA). This EUA will remain in effect (meaning this test can be used) for the duration of the COVID-19 declaration under Section 564(b)(1) of the Act, 21 U.S.C. section 360bbb-3(b)(1), unless the authorization is terminated or revoked.  Performed at [facility], [address] [place], [place] [address]   Surgical pcr screen     Status: Abnormal   Collection Time: 08/13/22 10:28 PM   Specimen: Nasal Mucosa; Nasal Swab  Result Value Ref Range Status   MRSA, PCR POSITIVE (A) NEGATIVE Final   Staphylococcus aureus POSITIVE (A) NEGATIVE Final    Comment: RESULT CALLED TO, READ BACK BY AND VERIFIED WITH: (NOTE) The Xpert SA Assay (FDA approved for NASAL specimens in patients 71 years of age and older), is one component of a comprehensive surveillance program. It is not intended to diagnose infection nor to guide or monitor treatment. Performed at [facility], [address], [place], [place] [address]          Radiology Studies: DG CHEST PORT 1 VIEW  Result Date: 08/13/2022 CLINICAL DATA:  Hypoxemia. EXAM: PORTABLE CHEST 1 VIEW COMPARISON:  August 11, 2022. FINDINGS: Stable cardiomediastinal silhouette. Tracheostomy tube is unchanged. Minimal left basilar subsegmental atelectasis. Right lung is clear. Bony thorax is unremarkable. IMPRESSION: Minimal left basilar subsegmental atelectasis. Electronically Signed   By: Lupita Raider M.D.   On: 08/13/2022  13:43        Scheduled Meds:  [MAR Hold] enoxaparin (LOVENOX) injection  40 mg Subcutaneous Q24H   [MAR Hold] finasteride  5 mg Oral Daily   [MAR Hold] folic acid  1 mg Oral Daily   [MAR Hold] magnesium oxide  200 mg Oral Daily   [MAR Hold] metoCLOPramide (REGLAN) injection  5 mg Intravenous Q8H   [MAR Hold] pantoprazole (PROTONIX) IV  40 mg Intravenous Q12H   [MAR Hold] senna  1 tablet Oral BID   [MAR Hold] sodium chloride flush  3 mL Intravenous Q12H   [MAR Hold] sodium chloride  1 g Oral TID   [MAR Hold] tamsulosin  0.4 mg Oral QPC supper   [MAR Hold] thiamine  100 mg Oral Daily   [MAR Hold] traZODone  50 mg Oral QHS   Continuous Infusions:  [MAR Hold] sodium chloride 250 mL (08/13/22 0545)  lactated ringers 100 mL/hr at 08/14/22 0401   [MAR Hold] levETIRAcetam 1,000 mg (08/13/22 2132)     LOS: 3 days    Time spent: 35 minutes    Kailand Seda A Bertina Guthridge, MD [facility]   If 7PM-7AM, please contact night-coverage [email or web address]  08/14/2022, 9:19 AM

## 2022-08-14 NOTE — Interval H&P Note (Signed)
History and Physical Interval Note: Patient feels the same - no interval changes from yesterday. Scheduled for EGD with possible dilation today. I have outlined risks / benefits and he wishes to proceed. All questions answered. He agrees with the plan, further recommendations pending the results.   08/14/2022 9:52 AM  Evan Jacobs  has presented today for surgery, with the diagnosis of Esophageal stricture, itching/regurgitation.  The various methods of treatment have been discussed with the patient and family. After consideration of risks, benefits and other options for treatment, the patient has consented to  Procedure(s) with comments: ESOPHAGOGASTRODUODENOSCOPY (EGD) WITH PROPOFOL (N/A) - With esophageal dilation as a surgical intervention.  The patient's history has been reviewed, patient examined, no change in status, stable for surgery.  I have reviewed the patient's chart and labs.  Questions were answered to the patient's satisfaction.     Viviann Spare P Brea Coleson

## 2022-08-14 NOTE — Progress Notes (Signed)
SLP Cancellation Note  Patient Details Name: Evan Jacobs MRN: [id number] DOB: 11-Jun-1951   Cancelled treatment:       Reason Eval/Treat Not Completed: Medical issues which prohibited therapy. Pt ordered to have MBS but is currently NPO pending gastric emptying test as well as EGD. Discussed scheduling with radiology. Together, we will f/u to complete MBS as soon as able. Recommend MD also consider ordering SLP bedside swallow eval as well so that we can evaluate pt clinically prior to completing study in radiology.     Mahala Menghini., M.A. CCC-SLP Acute Rehabilitation Services Office [phone]  Secure chat preferred  08/14/2022, 7:48 AM

## 2022-08-14 NOTE — Care Management Important Message (Signed)
Important Message  Patient Details  Name: Evan Jacobs MRN: [id number] Date of Birth: 07/18/1951   Medicare Important Message Given:  Yes     Essance Gatti 08/14/2022, 1:37 PM

## 2022-08-14 NOTE — NC FL2 (Signed)
[place] MEDICAID FL2 LEVEL OF CARE FORM     IDENTIFICATION  Patient Name: Evan Jacobs Birthdate: [birth year] Sex: male Admission Date (Current Location): 08/11/2022  [place] and [place] Number:  Haynes Bast [id number] Facility and Address:  [facility], [address], [place], [place] [address]      Provider Number: [id number]  Attending Physician Name and Address:  Alba Cory, MD  Relative Name and Phone Number:       Current Level of Care: Hospital Recommended Level of Care: Skilled Nursing Facility Prior Approval Number:    Date Approved/Denied: 08/14/22 PASRR Number: [id number]  Discharge Plan:      Current Diagnoses: Patient Active Problem List   Diagnosis Date Noted   Esophageal stricture 08/13/2022   Dysphagia 08/13/2022   Vomiting 08/11/2022   Acute tracheostomy management (HCC) 08/11/2022   Acute on chronic urinary retention 08/11/2022   Malnutrition of moderate degree 07/01/2020   AMS (altered mental status) 06/30/2020   Acute encephalopathy 06/30/2020   Acute respiratory failure (HCC)    Seizures (HCC) 08/29/2019   Hyponatremia 08/29/2019   Lactic acidosis 08/29/2019   Hypokalemia 08/29/2019   Hypomagnesemia 08/29/2019    Orientation RESPIRATION BLADDER Height & Weight        Tracheostomy, O2 (6L) Continent Weight: 126 lb 15.8 oz (57.6 kg) Height:  5\' 6"  (167.6 cm)  BEHAVIORAL SYMPTOMS/MOOD NEUROLOGICAL BOWEL NUTRITION STATUS      Continent Diet  AMBULATORY STATUS COMMUNICATION OF NEEDS Skin   Limited Assist Verbally Normal                       Personal Care Assistance Level of Assistance  Bathing, Feeding, Dressing Bathing Assistance: Limited assistance Feeding assistance: Limited assistance Dressing Assistance: Limited assistance     Functional Limitations Info  Sight, Hearing, Speech Sight Info: Adequate Hearing Info: Adequate Speech Info: Adequate    SPECIAL CARE FACTORS FREQUENCY  PT  (By licensed PT), OT (By licensed OT)     PT Frequency: 5x weekly OT Frequency: 5x weekly            Contractures Contractures Info: Not present    Additional Factors Info  Code Status, Allergies Code Status Info: Full Code Allergies Info: No known allergies           Current Medications (08/14/2022):  This is the current hospital active medication list Current Facility-Administered Medications  Medication Dose Route Frequency Provider Last Rate Last Admin   [facility] Hold] 0.9 %  sodium chloride infusion  250 mL Intravenous PRN Norins, [facility], MD 10 mL/hr at 08/13/22 0545 250 mL at 08/13/22 0545   [MAR Hold] acetaminophen (TYLENOL) tablet 650 mg  650 mg Oral Q6H PRN Norins, 08/15/22, MD       Or   Rosalyn Gess Hold] acetaminophen (TYLENOL) suppository 650 mg  650 mg Rectal Q6H PRN Norins, Mitzi Hansen, MD       [MAR Hold] chlorproMAZINE (THORAZINE) tablet 25 mg  25 mg Oral TID PRN Regalado, Belkys A, MD   25 mg at 08/13/22 1250   [MAR Hold] enoxaparin (LOVENOX) injection 40 mg  40 mg Subcutaneous Q24H Esterwood, Amy S, PA-C       [MAR Hold] finasteride (PROSCAR) tablet 5 mg  5 mg Oral Daily Norins, 04-30-1993, MD   5 mg at 08/13/22 1247   [MAR Hold] folic acid (FOLVITE) tablet 1 mg  1 mg Oral Daily Norins, 08/15/22, MD   1 mg  at 08/13/22 1249   lactated ringers infusion   Intravenous Continuous Regalado, Belkys A, MD 100 mL/hr at 08/14/22 0957 Continued from Pre-op at 08/14/22 0957   lactated ringers infusion   Intravenous Continuous Armbruster, Willaim Rayas, MD 10 mL/hr at 08/14/22 0924 New Bag at 08/14/22 0924   [MAR Hold] levETIRAcetam (KEPPRA) IVPB 1000 mg/100 mL premix  1,000 mg Intravenous Q12H Regalado, Belkys A, MD 400 mL/hr at 08/13/22 2132 1,000 mg at 08/13/22 2132   [MAR Hold] magnesium oxide (MAG-OX) tablet 200 mg  200 mg Oral Daily Norins, Rosalyn Gess, MD   200 mg at 08/13/22 1250   [MAR Hold] metoCLOPramide (REGLAN) injection 5 mg  5 mg Intravenous Q8H Regalado, Belkys A, MD   5 mg  at 08/14/22 0616   [MAR Hold] ondansetron (ZOFRAN) injection 4 mg  4 mg Intravenous Q6H PRN Regalado, Belkys A, MD   4 mg at 08/12/22 1019   [MAR Hold] pantoprazole (PROTONIX) injection 40 mg  40 mg Intravenous Q12H Regalado, Belkys A, MD   40 mg at 08/13/22 2119   [MAR Hold] senna (SENOKOT) tablet 8.6 mg  1 tablet Oral BID Jacques Navy, MD   8.6 mg at 08/13/22 2111   [facility] Hold] sodium chloride flush (NS) 0.9 % injection 3 mL  3 mL Intravenous Q12H Norins, Rosalyn Gess, MD   3 mL at 08/13/22 2127   [facility] Hold] sodium chloride flush (NS) 0.9 % injection 3 mL  3 mL Intravenous PRN Jacques Navy, MD   3 mL at 08/13/22 2126   [facility] Hold] sodium chloride tablet 1 g  1 g Oral TID Jacques Navy, MD   1 g at 08/13/22 2110   [MAR Hold] tamsulosin (FLOMAX) capsule 0.4 mg  0.4 mg Oral QPC supper Jacques Navy, MD   0.4 mg at 08/13/22 1653   [MAR Hold] thiamine (VITAMIN B1) tablet 100 mg  100 mg Oral Daily Norins, Rosalyn Gess, MD   100 mg at 08/13/22 1249   [MAR Hold] traZODone (DESYREL) tablet 50 mg  50 mg Oral QHS Jacques Navy, MD   50 mg at 08/13/22 2112   Facility-Administered Medications Ordered in Other Encounters  Medication Dose Route Frequency Provider Last Rate Last Admin   propofol (DIPRIVAN) 500 MG/50ML infusion   Intravenous Continuous PRN Montez Morita, April W, CRNA 43.2 mL/hr at 08/14/22 1002 125 mcg/kg/min at 08/14/22 1002     Discharge Medications: Please see discharge summary for a list of discharge medications.  Relevant Imaging Results:  Relevant Lab Results:   Additional Information SSN: [id number]  Inis Sizer, LCSW

## 2022-08-14 NOTE — Anesthesia Preprocedure Evaluation (Signed)
Anesthesia Evaluation  Patient identified by MRN, date of birth, ID band Patient awake    Reviewed: Allergy & Precautions, H&P , NPO status , Patient's Chart, lab work & pertinent test results  Airway Mallampati: Trach   Neck ROM: full    Dental   Pulmonary  H/o laryngeal stenosis requiring tracheostomy   breath sounds clear to auscultation       Cardiovascular hypertension,  Rhythm:regular Rate:Normal     Neuro/Psych Seizures -,  CVA    GI/Hepatic ,GERD  ,,(+)     substance abuse  alcohol use  Endo/Other    Renal/GU      Musculoskeletal   Abdominal   Peds  Hematology   Anesthesia Other Findings   Reproductive/Obstetrics                             Anesthesia Physical Anesthesia Plan  ASA: 3  Anesthesia Plan: MAC   Post-op Pain Management:    Induction: Intravenous  PONV Risk Score and Plan: 1 and Propofol infusion and Treatment may vary due to age or medical condition  Airway Management Planned: Nasal Cannula  Additional Equipment:   Intra-op Plan:   Post-operative Plan:   Informed Consent: I have reviewed the patients History and Physical, chart, labs and discussed the procedure including the risks, benefits and alternatives for the proposed anesthesia with the patient or authorized representative who has indicated his/her understanding and acceptance.     Dental advisory given  Plan Discussed with: CRNA, Anesthesiologist and Surgeon  Anesthesia Plan Comments:        Anesthesia Quick Evaluation

## 2022-08-14 NOTE — Progress Notes (Addendum)
2:30pm: CSW spoke with Irving Burton at [facility] who states the facility cannot accommodate a trach.  CSW left voicemail with Whitney of central intake for [facility] and [facility] to determine if [place] can accept the patient.  10am: CSW completed FL2 and faxed patient's clinicals out for review to obtain bed offers for short term rehab.  Edwin Dada, MSW, LCSW Transitions of Care  Clinical Social Worker II [phone]

## 2022-08-14 NOTE — Op Note (Signed)
[facility] Patient Name: Evan Jacobs Procedure Date : 08/14/2022 MRN: [id number] Attending MD: Willaim Rayas. Adela Lank , MD, [id number] Date of Birth: May 21, 1951 CSN: [id number] Age: 71 Admit Type: Inpatient Procedure:                Upper GI endoscopy Indications:              Dysphagia - history of severe esophageal stricture                            at outside hospital last month, LA grade D                            esophagitis, dilated with endoscope itself.                            Intolerant to solids. Unclear if patient has been                            taking PPI outside hospital. Dr. Lanae Boast - [facility]                            [facility] is primary GI MD Providers:                Willaim Rayas. Adela Lank, MD, Margaree Mackintosh,                            RN, [facility] Technician, Technician Referring MD:              Medicines:                Monitored Anesthesia Care Complications:            No immediate complications. Estimated blood loss:                            Minimal. Estimated Blood Loss:     Estimated blood loss was minimal. Procedure:                Pre-Anesthesia Assessment:                           - Prior to the procedure, a History and Physical                            was performed, and patient medications and                            allergies were reviewed. The patient's tolerance of                            previous anesthesia was also reviewed. The risks                            and benefits of the procedure and the sedation  options and risks were discussed with the patient.                            All questions were answered, and informed consent                            was obtained. Prior Anticoagulants: The patient has                            taken no anticoagulant or antiplatelet agents. ASA                            Grade Assessment: III - A patient with severe                             systemic disease. After reviewing the risks and                            benefits, the patient was deemed in satisfactory                            condition to undergo the procedure.                           After obtaining informed consent, the endoscope was                            passed under direct vision. Throughout the                            procedure, the patient's blood pressure, pulse, and                            oxygen saturations were monitored continuously. The                            [id number]-H190 ([id number]) Olympus endoscope was introduced                            through the mouth, and advanced to the second part                            of duodenum. The upper GI endoscopy was                            accomplished without difficulty. The patient                            tolerated the procedure well. Scope In: Scope Out: Findings:      Esophagogastric landmarks were identified: the Z-line was found at 37       cm, the gastroesophageal junction was found at 37 cm and the upper       extent of the gastric folds was found at 39 cm from  the incisors.      A 2 cm hiatal hernia was present.      One benign-appearing, intrinsic severe (stenosis; an endoscope cannot       pass) stenosis was found. This stenosis measured less than one cm (in       length). Lumen roughly 3-694mm or so. Regular adult endoscope could not       pass. Some ulceration at the stricture. A TTS dilator was passed through       the scope. Dilation with a 6-7-8 mm balloon and an 04-08-09 mm balloon       dilator was performed to 6 mm, 7 mm, 8 mm and 9 mm after which moderate       mucosal wrent noted. Still despite at 9mm due to fibrotic change there,       regular adult endoscope could not pass. This was removed and downsized       with pediatric ultra slim upper endoscope. This was easily able to       traverse the area and clear the rest of his stomach. No evidence of       malignant change  at the stricture or cardia.      One superficial large esophageal ulceration was found at the distal       esophagus.      The exam of the esophagus was otherwise normal.      The entire examined stomach was normal.      The examined duodenum was normal. Impression:               - Esophagogastric landmarks identified.                           - 2 cm hiatal hernia.                           - Benign-appearing severe esophageal stenosis.                            Dilated to 9mm with appropriate mucosal wrents -                            did not dilate further given this finding and                            ulceration at the site. Traversed with pediatric                            upper endoscope.                           - Normal stomach.                           - Normal examined duodenum.                           Patient should not be on any solid food until this                            stricture can  be dilated further, once inflammation                            has healed. Recommend twice daily dosing of PPI                            (can switch to prevacid solutab once outpatient)                            and liquid carafate. He warrants a repeat EGD in                            2-3 weeks once ulceration is better healed. He                            should be on full liquid diet until that time, I                            don't think he will be able to tolerate much of any                            significant solid food at this time, at high risk                            for food impaction until this is dilated further. Recommendation:           - Return patient to hospital ward for ongoing care.                           - Full liquid diet indefinitely until follow up                            dilation is performed. NO SOLID FOOD - high risk                            for impaction                           - Continue present medications.                            - IV protonix 40mg  twice daily while hospitalized,                            then oral form upon discharge                           - Add liquid carafate 10cc every 6 hours - this                            will make his stools dark                           -  Upon discharge may want to change PPI to prevacid                            solutab twice daily if that is easier for him to                            tolerate in regards to pills                           - Repeat upper endoscopy in 2-3 weeks for                            retreatment. He follows with Dr. [facility]                            for this - he can follow up with them upon discharge                           - Would still proceed with speech path evaluation                            to ensure no oropharyngeal component                           - Will sign off for now, please call with questions                            moving forward Procedure Code(s):        --- Professional ---                           [phone], Esophagogastroduodenoscopy, flexible,                            transoral; with transendoscopic balloon dilation of                            esophagus (less than 30 mm diameter) Diagnosis Code(s):        --- Professional ---                           K44.9, Diaphragmatic hernia without obstruction or                            gangrene                           K22.2, Esophageal obstruction                           R13.10, Dysphagia, unspecified CPT copyright 2022 American [employer] Association. All rights reserved. The codes documented in this report are preliminary and upon coder review may  be revised to meet current compliance requirements. Viviann Spare P. Rosevelt Luu, MD 08/14/2022 10:45:29 AM This report has been signed electronically. Number of Addenda: 0

## 2022-08-15 DIAGNOSIS — K5900 Constipation, unspecified: Secondary | ICD-10-CM | POA: Diagnosis not present

## 2022-08-15 DIAGNOSIS — R112 Nausea with vomiting, unspecified: Secondary | ICD-10-CM | POA: Diagnosis not present

## 2022-08-15 LAB — BASIC METABOLIC PANEL WITH GFR
Anion gap: 6 (ref 5–15)
BUN: <5 mg/dL — ABNORMAL LOW (ref 8–23)
CO2: 22 mmol/L (ref 22–32)
Calcium: 8 mg/dL — ABNORMAL LOW (ref 8.9–10.3)
Chloride: 110 mmol/L (ref 98–111)
Creatinine, Ser: 0.83 mg/dL (ref 0.61–1.24)
GFR, Estimated: >60 mL/min
Glucose, Bld: 87 mg/dL (ref 70–99)
Potassium: 3.4 mmol/L — ABNORMAL LOW (ref 3.5–5.1)
Sodium: 138 mmol/L (ref 135–145)

## 2022-08-15 MED ORDER — METOCLOPRAMIDE HCL 5 MG/ML IJ SOLN: 5.0000 mg | Freq: Three times a day (TID) | INTRAMUSCULAR | Status: DC | PRN

## 2022-08-15 MED ORDER — GUAIFENESIN 100 MG/5ML PO LIQD
15.0000 mL | Freq: Two times a day (BID) | ORAL | Status: DC
  Administered 2022-08-15 – 2022-09-03 (×39): 15 mL via ORAL
  Filled 2022-08-15 (×39): qty 15

## 2022-08-15 MED ORDER — POTASSIUM CHLORIDE 10 MEQ/100ML IV SOLN
10.0000 meq | INTRAVENOUS | Status: AC
  Administered 2022-08-15 (×3): 10 meq via INTRAVENOUS
  Filled 2022-08-15 (×3): qty 100

## 2022-08-15 MED ORDER — ENSURE ENLIVE PO LIQD
237.0000 mL | Freq: Three times a day (TID) | ORAL | Status: DC
  Administered 2022-08-15 – 2022-08-18 (×10): 237 mL via ORAL

## 2022-08-15 MED ORDER — COSYNTROPIN 0.25 MG IJ SOLR
0.2500 mg | Freq: Once | INTRAMUSCULAR | Status: AC
  Administered 2022-08-16: 0.25 mg via INTRAVENOUS
  Filled 2022-08-15: qty 0.25

## 2022-08-15 MED ORDER — IPRATROPIUM-ALBUTEROL 0.5-2.5 (3) MG/3ML IN SOLN: 3.0000 mL | Freq: Two times a day (BID) | RESPIRATORY_TRACT | Status: DC

## 2022-08-15 MED ORDER — IPRATROPIUM-ALBUTEROL 0.5-2.5 (3) MG/3ML IN SOLN: 3.0000 mL | RESPIRATORY_TRACT | Status: DC | PRN

## 2022-08-15 MED ORDER — LEVETIRACETAM 500 MG PO TABS
1000.0000 mg | ORAL_TABLET | Freq: Two times a day (BID) | ORAL | Status: DC
  Administered 2022-08-15 – 2022-09-03 (×39): 1000 mg via ORAL
  Filled 2022-08-15 (×39): qty 2

## 2022-08-15 NOTE — Progress Notes (Signed)
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  Patient had a recent ER visit on 07/26/2022 for the same symptoms.  Family also concerned that they are not able to care for him and his tracheostomy needs at home.   Assessment & Plan:   Principal Problem:   Vomiting Active Problems:   Seizures (HCC)   Acute tracheostomy management (HCC)   Hypokalemia   Acute on chronic urinary retention   Esophageal stricture   Dysphagia  1-Recurrent  nausea and vomiting: in setting esophageal stricture:  -Gastric Empty study ordered.  Patient was not able to complete test.  -CT abdomen pelvis: No CT evidence of acute. Moderate amount of retained colonic stool suggesting constipation. No evidence of bowel obstruction. -PRN zofran ordered. Change Reglan to PRN. -IV Protonix BID. He will need high dose PPI for several weeks, then repeat endoscopy for dilation.  -Cortisol level. 3-- Will do ACTH test tomorrow.  -Endoscopy: 2 cm hiatal hernia, benign-appearing severe esophageal stenosis.  Dilated to 9 mm with appropriate mucosal wrents.  No further dilation due to finding an ulceration at the site. -GI is recommending full liquid diet, patient should not to be on solid fluid until the stricture can be dilated further. He will need high dose PPI BID for several weeks then repeat endoscopy for dilation.  Continue with full liquid diet.   2-Hypotension; Related to hypovolemia.  Increase IV fluids.  Hold BP medications.  Improved.  Will do ACTH test   Acute tracheostomy management: Continue with trach care.  RT consult. Nebulizer PRN. Flutter valve, guaifenesin  ordered  Seizure disorder: History of seizure following CVA. Continue with Keppra, change to oral.   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  Bladder scan every shift.   Hypokalemia; Replaced  History of SIADH and frequent episode of hypokalemia  Constipation;  on senna.  Had BM  Estimated body mass index is 20.5 kg/m as calculated from the following:   Height as of this encounter: 5\' 6"  (1.676 m).   Weight as of this encounter: 57.6 kg.   DVT prophylaxis: Lovenox Code Status: Full code Family Communication: Care discussed with patient and wife over phone  Disposition Plan:  Status is: Inpatient Remains inpatient appropriate because: vomiting. Needs placement.     Consultants:  None  Procedures:  None  Antimicrobials:    Subjective: Denies pain, on full liquid diet.  Thick secretion, cough.    Objective: Vitals:   08/14/22 1931 08/14/22 2006 08/15/22 0200 08/15/22 0406  BP: (!) 155/70   (!) 157/69  Pulse: 72 75  69  Resp: 18 18  18   Temp: 98.1 F (36.7 C)   98.8 F (37.1 C)  TempSrc: Oral   Oral  SpO2: 100% 98% 100% 100%  Weight:      Height:        Intake/Output Summary (Last 24 hours) at 08/15/2022 Last data filed at 08/15/2022 0400 Gross per 24 hour  Intake 1400 ml  Output 3025 ml  Net -1625 ml    Filed Weights   08/11/22 2133 08/12/22 1157  Weight: 70.7 kg 57.6 kg  Examination:  General exam: NAD Respiratory system: Trach in place, BL ronchus Cardiovascular system: S 1, S 2 RRR Gastrointestinal system: BS present, soft, nt Central nervous system: Alert, follows command Extremities: no edema    Data Reviewed: I have personally reviewed following labs and imaging studies  CBC: Recent Labs  Lab 08/11/22 1241 08/12/22 0827 08/13/22 0254 08/14/22 0506  WBC 3.9* 4.3 3.7* 3.1*  NEUTROABS 1.3*  --   --   --   HGB  12.2* 10.8* 10.1* 10.0*  HCT 36.8* 32.9* 31.9* 30.6*  MCV 78.6* 78.9* 81.0 79.9*  PLT 242 226 190 190    Basic Metabolic Panel: Recent Labs  Lab 08/11/22 1241 08/12/22 1231 08/13/22 0254 08/14/22 0506  NA 141 138 140 139  K 3.3* 3.4* 3.4* 3.7  CL 106 106 113* 110  CO2 24 21* 20* 21*  GLUCOSE 97 91 85 78  BUN 10 7* 6* <5*  CREATININE 0.98 1.03 1.03 0.84  CALCIUM 9.7 8.8* 8.1* 8.2*    GFR: Estimated Creatinine Clearance: 63.8 mL/min (by C-G formula based on SCr of 0.84 mg/dL). Liver Function Tests: Recent Labs  Lab 08/11/22 1241 08/13/22 0254  AST 19 15  ALT 11 7  ALKPHOS 73 49  BILITOT 1.0 0.8  PROT 7.8 5.5*  ALBUMIN 4.0 2.8*    Recent Labs  Lab 08/11/22 1241  LIPASE 34    No results for input(s): "AMMONIA" in the last 168 hours. Coagulation Profile: No results for input(s): "INR", "PROTIME" in the last 168 hours. Cardiac Enzymes: No results for input(s): "CKTOTAL", "CKMB", "CKMBINDEX", "TROPONINI" in the last 168 hours. BNP (last 3 results) No results for input(s): "PROBNP" in the last 8760 hours. HbA1C: No results for input(s): "HGBA1C" in the last 72 hours. CBG: No results for input(s): "GLUCAP" in the last 168 hours. Lipid Profile: No results for input(s): "CHOL", "HDL", "LDLCALC", "TRIG", "CHOLHDL", "LDLDIRECT" in the last 72 hours. Thyroid Function Tests: No results for input(s): "TSH", "T4TOTAL", "FREET4", "T3FREE", "THYROIDAB" in the last 72 hours. Anemia Panel: Recent Labs    08/12/22 1231  VITAMINB12 443    Sepsis Labs: No results for input(s): "PROCALCITON", "LATICACIDVEN" in the last 168 hours.  Recent Results (from the past 240 hour(s))  Resp panel by RT-PCR (RSV, Flu A&B, Covid) Anterior Nasal Swab     Status: None   Collection Time: 08/11/22  3:31 PM   Specimen: Anterior Nasal Swab  Result Value Ref Range Status   SARS Coronavirus 2 by RT PCR NEGATIVE NEGATIVE Final    Comment: (NOTE) SARS-CoV-2 target nucleic acids are NOT  DETECTED.  The SARS-CoV-2 RNA is generally detectable in upper respiratory specimens during the acute phase of infection. The lowest concentration of SARS-CoV-2 viral copies this assay can detect is 138 copies/mL. A negative result does not preclude SARS-Cov-2 infection and should not be used as the sole basis for treatment or other patient management decisions. A negative result may occur with  improper specimen collection/handling, submission of specimen other than nasopharyngeal swab, presence of viral mutation(s) within the areas targeted by this assay, and inadequate number of viral copies(<138 copies/mL). A negative result must be combined with clinical observations, patient history, and epidemiological information. The expected result is Negative.  Fact Sheet for Patients:  BloggerCourse.comhttps://www.fda.gov/media/152166/download  Fact Sheet for Healthcare Providers:  SeriousBroker.ithttps://www.fda.gov/media/152162/download  This test is no t yet approved or cleared by the [place] FDA and  has been authorized for detection and/or diagnosis of SARS-CoV-2 by FDA under an Emergency  Use Authorization (EUA). This EUA will remain  in effect (meaning this test can be used) for the duration of the COVID-19 declaration under Section 564(b)(1) of the Act, 21 U.S.C.section 360bbb-3(b)(1), unless the authorization is terminated  or revoked sooner.       Influenza A by PCR NEGATIVE NEGATIVE Final   Influenza B by PCR NEGATIVE NEGATIVE Final    Comment: (NOTE) The Xpert Xpress SARS-CoV-2/FLU/RSV plus assay is intended as an aid in the diagnosis of influenza from Nasopharyngeal swab specimens and should not be used as a sole basis for treatment. Nasal washings and aspirates are unacceptable for Xpert Xpress SARS-CoV-2/FLU/RSV testing.  Fact Sheet for Patients: BloggerCourse.com  Fact Sheet for Healthcare Providers: SeriousBroker.it  This test is not yet  approved or cleared by the [place] FDA and has been authorized for detection and/or diagnosis of SARS-CoV-2 by FDA under an Emergency Use Authorization (EUA). This EUA will remain in effect (meaning this test can be used) for the duration of the COVID-19 declaration under Section 564(b)(1) of the Act, 21 U.S.C. section 360bbb-3(b)(1), unless the authorization is terminated or revoked.     Resp Syncytial Virus by PCR NEGATIVE NEGATIVE Final    Comment: (NOTE) Fact Sheet for Patients: BloggerCourse.com  Fact Sheet for Healthcare Providers: SeriousBroker.it  This test is not yet approved or cleared by the [place] FDA and has been authorized for detection and/or diagnosis of SARS-CoV-2 by FDA under an Emergency Use Authorization (EUA). This EUA will remain in effect (meaning this test can be used) for the duration of the COVID-19 declaration under Section 564(b)(1) of the Act, 21 U.S.C. section 360bbb-3(b)(1), unless the authorization is terminated or revoked.  Performed at [facility], [address] [place], [place] [address]   Surgical pcr screen     Status: Abnormal   Collection Time: 08/13/22 10:28 PM   Specimen: Nasal Mucosa; Nasal Swab  Result Value Ref Range Status   MRSA, PCR POSITIVE (A) NEGATIVE Final   Staphylococcus aureus POSITIVE (A) NEGATIVE Final    Comment: RESULT CALLED TO, READ BACK BY AND VERIFIED WITH: (NOTE) The Xpert SA Assay (FDA approved for NASAL specimens in patients 95 years of age and older), is one component of a comprehensive surveillance program. It is not intended to diagnose infection nor to guide or monitor treatment. Performed at [facility], [address] [place], [place] [address]          Radiology Studies: DG CHEST PORT 1 VIEW  Result Date: 08/13/2022 CLINICAL DATA:  Hypoxemia. EXAM: PORTABLE CHEST 1 VIEW COMPARISON:  August 11, 2022. FINDINGS:  Stable cardiomediastinal silhouette. Tracheostomy tube is unchanged. Minimal left basilar subsegmental atelectasis. Right lung is clear. Bony thorax is unremarkable. IMPRESSION: Minimal left basilar subsegmental atelectasis. Electronically Signed   By: Lupita Raider M.D.   On: 08/13/2022 13:43        Scheduled Meds:  [START ON 08/16/2022] cosyntropin  0.25 mg Intravenous Once   enoxaparin (LOVENOX) injection  40 mg Subcutaneous Q24H   finasteride  5 mg Oral Daily   folic acid  1 mg Oral Daily   magnesium oxide  200 mg Oral Daily   pantoprazole (PROTONIX) IV  40 mg Intravenous Q12H   senna  1 tablet Oral BID   sodium chloride flush  3 mL Intravenous Q12H   sodium chloride  1 g Oral TID   tamsulosin  0.4 mg Oral QPC supper   thiamine  100 mg Oral  Daily   traZODone  50 mg Oral QHS   Continuous Infusions:  sodium chloride 250 mL (08/13/22 0545)   lactated ringers 100 mL/hr at 08/14/22 2118   levETIRAcetam 1,000 mg (08/14/22 2121)     LOS: 4 days    Time spent: 35 minutes    Ajia Chadderdon A Jamirah Zelaya, MD [facility]   If 7PM-7AM, please contact night-coverage [email or web address]  08/15/2022, 7:02 AM

## 2022-08-15 NOTE — Plan of Care (Signed)
0700:Pt resting in bed at beginning of shift, pt alert and oriented x4, pt has trach in place, pt with trach collar mask on 6L O2, Pt call bell within reach and bed I lowest position.  Dr.Regalado found RN and notified RN to call RT and make them aware pt needing breathing treatment/chest PT to help with mucus/secretions, MD aldo asked RN to notify case manager that pt stable for D/C and able to go today if bed available to SNF. RN notified RT and case Research officer, political party. Per provider pt to stay on full liquid diet only, even when transferring to SNF.  1000: RN assisted pt up in chair per MD request

## 2022-08-16 DIAGNOSIS — R112 Nausea with vomiting, unspecified: Secondary | ICD-10-CM | POA: Diagnosis not present

## 2022-08-16 LAB — ACTH STIMULATION, 3 TIME POINTS
Cortisol, 30 Min: 15 ug/dL
Cortisol, 60 Min: 16.3 ug/dL
Cortisol, Base: 7.6 ug/dL

## 2022-08-16 LAB — BASIC METABOLIC PANEL
Anion gap: 5 (ref 5–15)
BUN: 5 mg/dL — ABNORMAL LOW (ref 8–23)
CO2: 23 mmol/L (ref 22–32)
Calcium: 8.2 mg/dL — ABNORMAL LOW (ref 8.9–10.3)
Chloride: 109 mmol/L (ref 98–111)
Creatinine, Ser: 0.82 mg/dL (ref 0.61–1.24)
GFR, Estimated: >60 mL/min (ref >60)
Glucose, Bld: 94 mg/dL (ref 70–99)
Potassium: 4.1 mmol/L (ref 3.5–5.1)
Sodium: 137 mmol/L (ref 135–145)

## 2022-08-16 LAB — MAGNESIUM: Magnesium: 1.4 mg/dL — ABNORMAL LOW (ref 1.7–2.4)

## 2022-08-16 MED ORDER — MAGNESIUM SULFATE 2 GM/50ML IV SOLN
2.0000 g | Freq: Once | INTRAVENOUS | Status: AC
  Administered 2022-08-16: 2 g via INTRAVENOUS
  Filled 2022-08-16: qty 50

## 2022-08-16 NOTE — Progress Notes (Addendum)
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  Patient had a recent ER visit on 07/26/2022 for the same symptoms.  Family also concerned that they are not able to care for him and his tracheostomy needs at home.   Assessment & Plan:   Principal Problem:   Vomiting Active Problems:   Seizures (HCC)   Acute tracheostomy management (HCC)   Hypokalemia   Acute on chronic urinary retention   Esophageal stricture   Dysphagia  1-Recurrent  nausea and vomiting: in setting esophageal stricture:  -CT abdomen pelvis: No CT evidence of acute. Moderate amount of retained colonic stool suggesting constipation. No evidence of bowel obstruction. -PRN zofran ordered. Change Reglan to PRN. -IV Protonix BID. He will need high dose PPI for several weeks, then repeat endoscopy for dilation.  -Cortisol level. 3--  ACTH test pending  -Endoscopy: 2 cm hiatal hernia, benign-appearing severe esophageal stenosis.  Dilated to 9 mm with appropriate mucosal wrents.  No further dilation due to finding an ulceration at the site. -GI is recommending full liquid diet, patient should not to be on solid fluid until the stricture can be dilated further. -He will need high dose PPI BID for several weeks then repeat endoscopy for dilation.  Continue with full liquid diet.   2-Hypotension; Related to hypovolemia.  Hold BP medications.  Improved with fluids  Will do ACTH test   Acute tracheostomy management: Continue with trach care.  RT consult. Nebulizer PRN. Flutter valve, guaifenesin ordered  Seizure disorder: History of seizure following CVA. Continue with Keppra.   Acute on  chronic urinary retention: He was a started on Proscar and finasteride Pelvic ultrasound: no significant enlargement of prostate gland.  Bladder scan every shift.   Hypokalemia; Replaced  History of SIADH and frequent episode of hypokalemia  Constipation;  on senna.  Had BM  Hypomagnesemia; replete IV   Estimated body mass index is 20.5 kg/m as calculated from the following:   Height as of this encounter: 5\' 6"  (1.676 m).   Weight as of this encounter: 57.6 kg.   DVT prophylaxis: Lovenox Code Status: Full code Family Communication: Care discussed with patient and wife over phone  Disposition Plan:  Status is: Inpatient Remains inpatient appropriate because: vomiting. Needs placement.     Consultants:  None  Procedures:  None  Antimicrobials:    Subjective: Tolerating full liquid diet Cough better    Objective: Vitals:   08/16/22 0035 08/16/22 0435 08/16/22 0850 08/16/22 0934  BP:  (!) 142/91  (!) 149/72  Pulse: 68 67 76 70  Resp: 18 18 18 16   Temp:  98.1 F (36.7 C)  98.1 F (36.7 C)  TempSrc:  Oral  Oral  SpO2: 99% 97% 96% 97%  Weight:      Height:        Intake/Output Summary (Last 24 hours) at 08/16/2022 1138 Last data filed at 08/16/2022 1112 Gross per 24 hour  Intake 1310 ml  Output 1750 ml  Net -440 ml    Filed Weights   08/11/22 2133 08/12/22 1157  Weight: 70.7 kg 57.6 kg    Examination:  General exam: NAD Respiratory system: Trach in place, BL ronchus Cardiovascular system: S 1, S 2  RRR Gastrointestinal system: BS present, soft, nt Central nervous system: Alert, follow command Extremities: no edema    Data Reviewed: I have personally reviewed following labs and imaging studies  CBC: Recent Labs  Lab 08/11/22 1241 08/12/22 0827 08/13/22 0254 08/14/22 0506  WBC 3.9* 4.3 3.7* 3.1*  NEUTROABS 1.3*  --   --   --   HGB 12.2* 10.8* 10.1* 10.0*  HCT 36.8* 32.9* 31.9* 30.6*  MCV 78.6* 78.9* 81.0 79.9*  PLT 242 226 190 190     Basic Metabolic Panel: Recent Labs  Lab 08/12/22 1231 08/13/22 0254 08/14/22 0506 08/15/22 0912 08/16/22 0750  NA 138 140 139 138 137  K 3.4* 3.4* 3.7 3.4* 4.1  CL 106 113* 110 110 109  CO2 21* 20* 21* 22 23  GLUCOSE 91 85 78 87 94  BUN 7* 6* <5* <5* 5*  CREATININE 1.03 1.03 0.84 0.83 0.82  CALCIUM 8.8* 8.1* 8.2* 8.0* 8.2*  MG  --   --   --   --  1.4*    GFR: Estimated Creatinine Clearance: 65.4 mL/min (by C-G formula based on SCr of 0.82 mg/dL). Liver Function Tests: Recent Labs  Lab 08/11/22 1241 08/13/22 0254  AST 19 15  ALT 11 7  ALKPHOS 73 49  BILITOT 1.0 0.8  PROT 7.8 5.5*  ALBUMIN 4.0 2.8*    Recent Labs  Lab 08/11/22 1241  LIPASE 34    No results for input(s): "AMMONIA" in the last 168 hours. Coagulation Profile: No results for input(s): "INR", "PROTIME" in the last 168 hours. Cardiac Enzymes: No results for input(s): "CKTOTAL", "CKMB", "CKMBINDEX", "TROPONINI" in the last 168 hours. BNP (last 3 results) No results for input(s): "PROBNP" in the last 8760 hours. HbA1C: No results for input(s): "HGBA1C" in the last 72 hours. CBG: No results for input(s): "GLUCAP" in the last 168 hours. Lipid Profile: No results for input(s): "CHOL", "HDL", "LDLCALC", "TRIG", "CHOLHDL", "LDLDIRECT" in the last 72 hours. Thyroid Function Tests: No results for input(s): "TSH", "T4TOTAL", "FREET4", "T3FREE", "THYROIDAB" in the last 72 hours. Anemia Panel: No results for input(s): "VITAMINB12", "FOLATE", "FERRITIN", "TIBC", "IRON", "RETICCTPCT" in the last 72 hours.  Sepsis Labs: No results for input(s): "PROCALCITON", "LATICACIDVEN" in the last 168 hours.  Recent Results (from the past 240 hour(s))  Resp panel by RT-PCR (RSV, Flu A&B, Covid) Anterior Nasal Swab     Status: None   Collection Time: 08/11/22  3:31 PM   Specimen: Anterior Nasal Swab  Result Value Ref Range Status   SARS Coronavirus 2 by RT PCR NEGATIVE NEGATIVE Final    Comment:  (NOTE) SARS-CoV-2 target nucleic acids are NOT DETECTED.  The SARS-CoV-2 RNA is generally detectable in upper respiratory specimens during the acute phase of infection. The lowest concentration of SARS-CoV-2 viral copies this assay can detect is 138 copies/mL. A negative result does not preclude SARS-Cov-2 infection and should not be used as the sole basis for treatment or other patient management decisions. A negative result may occur with  improper specimen collection/handling, submission of specimen other than nasopharyngeal swab, presence of viral mutation(s) within the areas targeted by this assay, and inadequate number of viral copies(<138 copies/mL). A negative result must be combined with clinical observations, patient history, and epidemiological information. The expected result is Negative.  Fact Sheet for Patients:  BloggerCourse.com  Fact Sheet for Healthcare Providers:  SeriousBroker.it  This test is no t yet approved or cleared by the [place] and  has been authorized for  detection and/or diagnosis of SARS-CoV-2 by FDA under an Emergency Use Authorization (EUA). This EUA will remain  in effect (meaning this test can be used) for the duration of the COVID-19 declaration under Section 564(b)(1) of the Act, 21 U.S.C.section 360bbb-3(b)(1), unless the authorization is terminated  or revoked sooner.       Influenza A by PCR NEGATIVE NEGATIVE Final   Influenza B by PCR NEGATIVE NEGATIVE Final    Comment: (NOTE) The Xpert Xpress SARS-CoV-2/FLU/RSV plus assay is intended as an aid in the diagnosis of influenza from Nasopharyngeal swab specimens and should not be used as a sole basis for treatment. Nasal washings and aspirates are unacceptable for Xpert Xpress SARS-CoV-2/FLU/RSV testing.  Fact Sheet for Patients: BloggerCourse.com  Fact Sheet for Healthcare  Providers: SeriousBroker.it  This test is not yet approved or cleared by the [place] FDA and has been authorized for detection and/or diagnosis of SARS-CoV-2 by FDA under an Emergency Use Authorization (EUA). This EUA will remain in effect (meaning this test can be used) for the duration of the COVID-19 declaration under Section 564(b)(1) of the Act, 21 U.S.C. section 360bbb-3(b)(1), unless the authorization is terminated or revoked.     Resp Syncytial Virus by PCR NEGATIVE NEGATIVE Final    Comment: (NOTE) Fact Sheet for Patients: BloggerCourse.com  Fact Sheet for Healthcare Providers: SeriousBroker.it  This test is not yet approved or cleared by the [place] FDA and has been authorized for detection and/or diagnosis of SARS-CoV-2 by FDA under an Emergency Use Authorization (EUA). This EUA will remain in effect (meaning this test can be used) for the duration of the COVID-19 declaration under Section 564(b)(1) of the Act, 21 U.S.C. section 360bbb-3(b)(1), unless the authorization is terminated or revoked.  Performed at [facility], [address], [place], [place] [address]   Surgical pcr screen     Status: Abnormal   Collection Time: 08/13/22 10:28 PM   Specimen: Nasal Mucosa; Nasal Swab  Result Value Ref Range Status   MRSA, PCR POSITIVE (A) NEGATIVE Final   Staphylococcus aureus POSITIVE (A) NEGATIVE Final    Comment: RESULT CALLED TO, READ BACK BY AND VERIFIED WITH: (NOTE) The Xpert SA Assay (FDA approved for NASAL specimens in patients 27 years of age and older), is one component of a comprehensive surveillance program. It is not intended to diagnose infection nor to guide or monitor treatment. Performed at [facility], [address] [place], [place] [address]          Radiology Studies: No results found.      Scheduled Meds:  enoxaparin (LOVENOX)  injection  40 mg Subcutaneous Q24H   feeding supplement  237 mL Oral TID BM   finasteride  5 mg Oral Daily   folic acid  1 mg Oral Daily   guaiFENesin  15 mL Oral BID   levETIRAcetam  1,000 mg Oral BID   magnesium oxide  200 mg Oral Daily   pantoprazole (PROTONIX) IV  40 mg Intravenous Q12H   senna  1 tablet Oral BID   sodium chloride flush  3 mL Intravenous Q12H   sodium chloride  1 g Oral TID   tamsulosin  0.4 mg Oral QPC supper   thiamine  100 mg Oral Daily   traZODone  50 mg Oral QHS   Continuous Infusions:  sodium chloride 250 mL (08/13/22 0545)   lactated ringers 50 mL/hr at 08/16/22 0700     LOS: 5 days  Time spent: 35 minutes    Myangel Summons A Sunnie Nielsen, MD [facility]   If 7PM-7AM, please contact night-coverage [email or web address]  08/16/2022, 11:38 AM

## 2022-08-17 ENCOUNTER — Encounter (HOSPITAL_COMMUNITY): Payer: Self-pay | Admitting: Gastroenterology

## 2022-08-17 DIAGNOSIS — R112 Nausea with vomiting, unspecified: Secondary | ICD-10-CM | POA: Diagnosis not present

## 2022-08-17 DIAGNOSIS — K5900 Constipation, unspecified: Secondary | ICD-10-CM | POA: Diagnosis not present

## 2022-08-17 LAB — MAGNESIUM: Magnesium: 1.8 mg/dL (ref 1.7–2.4)

## 2022-08-17 MED ORDER — CHLORHEXIDINE GLUCONATE CLOTH 2 % EX PADS: 6.0000 | MEDICATED_PAD | Freq: Every day | CUTANEOUS | Status: DC

## 2022-08-17 MED ORDER — MAGNESIUM SULFATE 2 GM/50ML IV SOLN
2.0000 g | Freq: Once | INTRAVENOUS | Status: AC
  Administered 2022-08-17: 2 g via INTRAVENOUS
  Filled 2022-08-17: qty 50

## 2022-08-17 MED ORDER — MUPIROCIN 2 % EX OINT
TOPICAL_OINTMENT | Freq: Two times a day (BID) | CUTANEOUS | Status: DC
  Administered 2022-08-21 (×2): 1 via NASAL
  Filled 2022-08-17 (×3): qty 22
  Filled 2022-08-17: qty 44
  Filled 2022-08-17 (×9): qty 22

## 2022-08-17 MED ORDER — CHLORHEXIDINE GLUCONATE CLOTH 2 % EX PADS
6.0000 | MEDICATED_PAD | Freq: Every day | CUTANEOUS | Status: DC
  Administered 2022-08-18 – 2022-09-03 (×17): 6 via TOPICAL

## 2022-08-17 MED ORDER — AMLODIPINE BESYLATE 5 MG PO TABS
5.0000 mg | ORAL_TABLET | Freq: Every day | ORAL | Status: DC
  Administered 2022-08-17 – 2022-09-03 (×18): 5 mg via ORAL
  Filled 2022-08-17 (×18): qty 1

## 2022-08-17 NOTE — Progress Notes (Signed)
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  Patient had a recent ER visit on 07/26/2022 for the same symptoms.  Family also concerned that they are not able to care for him and his tracheostomy needs at home.   Assessment & Plan:   Principal Problem:   Vomiting Active Problems:   Seizures (HCC)   Acute tracheostomy management (HCC)   Hypokalemia   Acute on chronic urinary retention   Esophageal stricture   Dysphagia  1-Recurrent  nausea and vomiting: in setting esophageal stricture:  -CT abdomen pelvis: No CT evidence of acute. Moderate amount of retained colonic stool suggesting constipation. No evidence of bowel obstruction. -PRN zofran ordered. Change Reglan to PRN. -IV Protonix BID. He will need high dose PPI for several weeks, then repeat endoscopy for dilation.  -Cortisol level. 3--  ACTH negative -Endoscopy: 2 cm hiatal hernia, benign-appearing severe esophageal stenosis.  Dilated to 9 mm with appropriate mucosal wrents.  No further dilation due to finding an ulceration at the site. -GI is recommending full liquid diet, patient should not to be on solid fluid until the stricture can be dilated further. -He will need high dose PPI BID for several weeks then repeat endoscopy for dilation.  -Continue with full liquid diet.   2-Hypotension; Related to hypovolemia.  Hold BP medications.  Improved with fluids  Resolved.  ACTH test: cortisol base 7,6---16 after 60 minutes.   Acute tracheostomy management: Continue with trach care.  RT consult. Nebulizer PRN. Flutter valve, guaifenesin ordered  Seizure disorder: History of seizure following  CVA. Continue with Keppra.   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  Bladder scan every shift.   Hypokalemia; Replaced  History of SIADH and frequent episode of hypokalemia  Constipation;  on senna.  Had BM  Hypomagnesemia; replete orally and IV  Estimated body mass index is 20.5 kg/m as calculated from the following:   Height as of this encounter: 5\' 6"  (1.676 m).   Weight as of this encounter: 57.6 kg.   DVT prophylaxis: Lovenox Code Status: Full code Family Communication: Care discussed with patient and wife over phone  Disposition Plan:  Status is: Inpatient Remains inpatient appropriate because: vomiting. Needs placement.     Consultants:  None  Procedures:  None  Antimicrobials:    Subjective: No new complaints. No vomiting. Had BM   Objective: Vitals:   08/17/22 0400 08/17/22 0739 08/17/22 0947 08/17/22 1115  BP: (!) 168/70 (!) 154/84    Pulse: 63 68 64 68  Resp: 18 16 16 16   Temp: 98.6 F (37 C) 98.4 F (36.9 C)    TempSrc: Oral Oral    SpO2: 96% 98% 99% 97%  Weight:      Height:        Intake/Output Summary (Last 24 hours) at 08/17/2022 1328 Last data filed at 08/17/2022 1139 Gross per 24 hour  Intake 1414 ml  Output 3001 ml  Net -1587 ml    Filed Weights   08/11/22 2133 08/12/22 1157  Weight: 70.7 kg 57.6 kg    Examination:  General exam: NAD Respiratory system: Trach in place, BL ronchus Cardiovascular  system: S 1, S 2 RRR Gastrointestinal system: BS present, soft, nt Central nervous system: Alert, Extremities: no edema    Data Reviewed: I have personally reviewed following labs and imaging studies  CBC: Recent Labs  Lab 08/11/22 1241 08/12/22 0827 08/13/22 0254 08/14/22 0506  WBC 3.9* 4.3 3.7* 3.1*  NEUTROABS 1.3*  --   --   --   HGB 12.2* 10.8* 10.1* 10.0*  HCT 36.8* 32.9* 31.9* 30.6*  MCV 78.6* 78.9*  81.0 79.9*  PLT 242 226 190 190    Basic Metabolic Panel: Recent Labs  Lab 08/12/22 1231 08/13/22 0254 08/14/22 0506 08/15/22 0912 08/16/22 0750 08/17/22 0733  NA 138 140 139 138 137  --   K 3.4* 3.4* 3.7 3.4* 4.1  --   CL 106 113* 110 110 109  --   CO2 21* 20* 21* 22 23  --   GLUCOSE 91 85 78 87 94  --   BUN 7* 6* <5* <5* 5*  --   CREATININE 1.03 1.03 0.84 0.83 0.82  --   CALCIUM 8.8* 8.1* 8.2* 8.0* 8.2*  --   MG  --   --   --   --  1.4* 1.8    GFR: Estimated Creatinine Clearance: 65.4 mL/min (by C-G formula based on SCr of 0.82 mg/dL). Liver Function Tests: Recent Labs  Lab 08/11/22 1241 08/13/22 0254  AST 19 15  ALT 11 7  ALKPHOS 73 49  BILITOT 1.0 0.8  PROT 7.8 5.5*  ALBUMIN 4.0 2.8*    Recent Labs  Lab 08/11/22 1241  LIPASE 34    No results for input(s): "AMMONIA" in the last 168 hours. Coagulation Profile: No results for input(s): "INR", "PROTIME" in the last 168 hours. Cardiac Enzymes: No results for input(s): "CKTOTAL", "CKMB", "CKMBINDEX", "TROPONINI" in the last 168 hours. BNP (last 3 results) No results for input(s): "PROBNP" in the last 8760 hours. HbA1C: No results for input(s): "HGBA1C" in the last 72 hours. CBG: No results for input(s): "GLUCAP" in the last 168 hours. Lipid Profile: No results for input(s): "CHOL", "HDL", "LDLCALC", "TRIG", "CHOLHDL", "LDLDIRECT" in the last 72 hours. Thyroid Function Tests: No results for input(s): "TSH", "T4TOTAL", "FREET4", "T3FREE", "THYROIDAB" in the last 72 hours. Anemia Panel: No results for input(s): "VITAMINB12", "FOLATE", "FERRITIN", "TIBC", "IRON", "RETICCTPCT" in the last 72 hours.  Sepsis Labs: No results for input(s): "PROCALCITON", "LATICACIDVEN" in the last 168 hours.  Recent Results (from the past 240 hour(s))  Resp panel by RT-PCR (RSV, Flu A&B, Covid) Anterior Nasal Swab     Status: None   Collection Time: 08/11/22  3:31 PM   Specimen: Anterior Nasal Swab  Result Value Ref Range  Status   SARS Coronavirus 2 by RT PCR NEGATIVE NEGATIVE Final    Comment: (NOTE) SARS-CoV-2 target nucleic acids are NOT DETECTED.  The SARS-CoV-2 RNA is generally detectable in upper respiratory specimens during the acute phase of infection. The lowest concentration of SARS-CoV-2 viral copies this assay can detect is 138 copies/mL. A negative result does not preclude SARS-Cov-2 infection and should not be used as the sole basis for treatment or other patient management decisions. A negative result may occur with  improper specimen collection/handling, submission of specimen other than nasopharyngeal swab, presence of viral mutation(s) within the areas targeted by this assay, and inadequate number of viral copies(<138 copies/mL). A negative result must be combined with clinical observations, patient history, and epidemiological information. The expected result is Negative.  Fact Sheet for Patients:  BloggerCourse.com  Fact Sheet for Healthcare Providers:  SeriousBroker.it  This test is no t yet approved or cleared by the [place] FDA and  has been authorized for detection and/or diagnosis of SARS-CoV-2 by FDA under an Emergency Use Authorization (EUA). This EUA will remain  in effect (meaning this test can be used) for the duration of the COVID-19 declaration under Section 564(b)(1) of the Act, 21 U.S.C.section 360bbb-3(b)(1), unless the authorization is terminated  or revoked sooner.       Influenza A by PCR NEGATIVE NEGATIVE Final   Influenza B by PCR NEGATIVE NEGATIVE Final    Comment: (NOTE) The Xpert Xpress SARS-CoV-2/FLU/RSV plus assay is intended as an aid in the diagnosis of influenza from Nasopharyngeal swab specimens and should not be used as a sole basis for treatment. Nasal washings and aspirates are unacceptable for Xpert Xpress SARS-CoV-2/FLU/RSV testing.  Fact Sheet for  Patients: BloggerCourse.com  Fact Sheet for Healthcare Providers: SeriousBroker.it  This test is not yet approved or cleared by the [place] FDA and has been authorized for detection and/or diagnosis of SARS-CoV-2 by FDA under an Emergency Use Authorization (EUA). This EUA will remain in effect (meaning this test can be used) for the duration of the COVID-19 declaration under Section 564(b)(1) of the Act, 21 U.S.C. section 360bbb-3(b)(1), unless the authorization is terminated or revoked.     Resp Syncytial Virus by PCR NEGATIVE NEGATIVE Final    Comment: (NOTE) Fact Sheet for Patients: BloggerCourse.com  Fact Sheet for Healthcare Providers: SeriousBroker.it  This test is not yet approved or cleared by the [place] FDA and has been authorized for detection and/or diagnosis of SARS-CoV-2 by FDA under an Emergency Use Authorization (EUA). This EUA will remain in effect (meaning this test can be used) for the duration of the COVID-19 declaration under Section 564(b)(1) of the Act, 21 U.S.C. section 360bbb-3(b)(1), unless the authorization is terminated or revoked.  Performed at [facility], [address], [place], [place] [address]   Surgical pcr screen     Status: Abnormal   Collection Time: 08/13/22 10:28 PM   Specimen: Nasal Mucosa; Nasal Swab  Result Value Ref Range Status   MRSA, PCR POSITIVE (A) NEGATIVE Final   Staphylococcus aureus POSITIVE (A) NEGATIVE Final    Comment: RESULT CALLED TO, READ BACK BY AND VERIFIED WITH: (NOTE) The Xpert SA Assay (FDA approved for NASAL specimens in patients 51 years of age and older), is one component of a comprehensive surveillance program. It is not intended to diagnose infection nor to guide or monitor treatment. Performed at [facility], [address] [place], [place] [address]          Radiology  Studies: No results found.      Scheduled Meds:  amLODipine  5 mg Oral Daily   enoxaparin (LOVENOX) injection  40 mg Subcutaneous Q24H   feeding supplement  237 mL Oral TID BM   finasteride  5 mg Oral Daily   folic acid  1 mg Oral Daily   guaiFENesin  15 mL Oral BID   levETIRAcetam  1,000 mg Oral BID   magnesium oxide  200 mg Oral Daily   pantoprazole (PROTONIX) IV  40 mg Intravenous Q12H   senna  1 tablet Oral BID   sodium chloride flush  3 mL Intravenous Q12H   sodium chloride  1 g Oral TID   tamsulosin  0.4 mg Oral QPC supper   thiamine  100 mg Oral Daily  traZODone  50 mg Oral QHS   Continuous Infusions:  sodium chloride 250 mL (08/13/22 0545)   magnesium sulfate bolus IVPB       LOS: 6 days    Time spent: 35 minutes    Deklyn Trachtenberg A Latavius Capizzi, MD [facility]   If 7PM-7AM, please contact night-coverage [email or web address]  08/17/2022, 1:28 PM

## 2022-08-17 NOTE — Anesthesia Postprocedure Evaluation (Signed)
Anesthesia Post Note  Patient: Evan Jacobs  Procedure(s) Performed: ESOPHAGOGASTRODUODENOSCOPY (EGD) WITH PROPOFOL ESOPHAGEAL DILATION     Patient location during evaluation: Endoscopy Anesthesia Type: MAC Level of consciousness: awake and alert Pain management: pain level controlled Vital Signs Assessment: post-procedure vital signs reviewed and stable Respiratory status: spontaneous breathing, nonlabored ventilation, respiratory function stable and patient connected to nasal cannula oxygen Cardiovascular status: stable and blood pressure returned to baseline Postop Assessment: no apparent nausea or vomiting Anesthetic complications: no   No notable events documented.  Last Vitals:  Vitals:   08/17/22 0400 08/17/22 0739  BP: (!) 168/70 (!) 154/84  Pulse: 63 68  Resp: 18 16  Temp: 37 C 36.9 C  SpO2: 96% 98%    Last Pain:  Vitals:   08/17/22 0739  TempSrc: Oral  PainSc:                  [place]

## 2022-08-17 NOTE — Plan of Care (Signed)
  Problem: Education: Goal: Knowledge of General Education information will improve Description: Including pain rating scale, medication(s)/side effects and non-pharmacologic comfort measures 08/17/2022 1425 by Doyce Para, RN Outcome: Progressing 08/17/2022 1425 by Doyce Para, RN Outcome: Progressing   Problem: Health Behavior/Discharge Planning: Goal: Ability to manage health-related needs will improve 08/17/2022 1425 by Doyce Para, RN Outcome: Progressing 08/17/2022 1425 by Doyce Para, RN Outcome: Progressing   Problem: Clinical Measurements: Goal: Ability to maintain clinical measurements within normal limits will improve 08/17/2022 1425 by Doyce Para, RN Outcome: Progressing 08/17/2022 1425 by Doyce Para, RN Outcome: Progressing Goal: Will remain free from infection 08/17/2022 1425 by Doyce Para, RN Outcome: Progressing 08/17/2022 1425 by Doyce Para, RN Outcome: Progressing Goal: Diagnostic test results will improve 08/17/2022 1425 by Doyce Para, RN Outcome: Progressing 08/17/2022 1425 by Doyce Para, RN Outcome: Progressing Goal: Respiratory complications will improve 08/17/2022 1425 by Doyce Para, RN Outcome: Progressing 08/17/2022 1425 by Doyce Para, RN Outcome: Progressing Goal: Cardiovascular complication will be avoided 08/17/2022 1425 by Doyce Para, RN Outcome: Progressing 08/17/2022 1425 by Doyce Para, RN Outcome: Progressing   Problem: Activity: Goal: Risk for activity intolerance will decrease 08/17/2022 1425 by Doyce Para, RN Outcome: Progressing 08/17/2022 1425 by Doyce Para, RN Outcome: Progressing   Problem: Nutrition: Goal: Adequate nutrition will be maintained 08/17/2022 1425 by Doyce Para, RN Outcome: Progressing 08/17/2022 1425 by Doyce Para, RN Outcome: Progressing   Problem:  Coping: Goal: Level of anxiety will decrease 08/17/2022 1425 by Doyce Para, RN Outcome: Progressing 08/17/2022 1425 by Doyce Para, RN Outcome: Progressing   Problem: Elimination: Goal: Will not experience complications related to bowel motility 08/17/2022 1425 by Doyce Para, RN Outcome: Progressing 08/17/2022 1425 by Doyce Para, RN Outcome: Progressing Goal: Will not experience complications related to urinary retention 08/17/2022 1425 by Doyce Para, RN Outcome: Progressing 08/17/2022 1425 by Doyce Para, RN Outcome: Progressing   Problem: Pain Managment: Goal: General experience of comfort will improve 08/17/2022 1425 by Doyce Para, RN Outcome: Progressing 08/17/2022 1425 by Doyce Para, RN Outcome: Progressing   Problem: Safety: Goal: Ability to remain free from injury will improve 08/17/2022 1425 by Doyce Para, RN Outcome: Progressing 08/17/2022 1425 by Doyce Para, RN Outcome: Progressing   Problem: Skin Integrity: Goal: Risk for impaired skin integrity will decrease 08/17/2022 1425 by Doyce Para, RN Outcome: Progressing 08/17/2022 1425 by Doyce Para, RN Outcome: Progressing

## 2022-08-17 NOTE — Progress Notes (Addendum)
Physical Therapy Treatment Patient Details Name: Evan Jacobs MRN: [id number] DOB: [birth year] Today's Date: 08/17/2022   History of Present Illness 71 y.o. male who presented to the ED for vomiting. Workup pending. 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    PT Comments    Pt progressing well towards their physical therapy goals; agreeable to participate with encouragement. Pt ambulating limited hallway distances with a walker and min guard assist. Presents as a high fall risk based on decreased gait speed and balance deficits. Continue to recommend SNF for ongoing Physical Therapy.      Recommendations for follow up therapy are one component of a multi-disciplinary discharge planning process, led by the attending physician.  Recommendations may be updated based on patient status, additional functional criteria and insurance authorization.  Follow Up Recommendations  Skilled nursing-short term rehab (<3 hours/day) Can patient physically be transported by private vehicle: Yes   Assistance Recommended at Discharge Set up Supervision/Assistance  Patient can return home with the following A little help with walking and/or transfers;A little help with bathing/dressing/bathroom   Equipment Recommendations  None recommended by PT    Recommendations for Other Services       Precautions / Restrictions Precautions Precautions: Fall Precaution Comments: trach Restrictions Weight Bearing Restrictions: No     Mobility  Bed Mobility Overal bed mobility: Needs Assistance Bed Mobility: Supine to Sit     Supine to sit: Supervision          Transfers Overall transfer level: Needs assistance Equipment used: Rolling walker (2 wheels) Transfers: Sit to/from Stand Sit to Stand:  Min guard                Ambulation/Gait Ambulation/Gait assistance: Min guard Gait Distance (Feet): 250 Feet Assistive device: Rolling walker (2 wheels) Gait Pattern/deviations: Step-to pattern, Decreased step length - left, Decreased dorsiflexion - left Gait velocity: decreased     General Gait Details: Pt reports leg length discrepancy with some associated steppage gait, requiring min guard overall for safety, no overt LOB   Stairs             Wheelchair Mobility    Modified Rankin (Stroke Patients Only)       Balance Overall balance assessment: Needs assistance Sitting-balance support: Feet supported Sitting balance-Leahy Scale: Good     Standing balance support: Bilateral upper extremity supported, During functional activity Standing balance-Leahy Scale: Poor                              Cognition Arousal/Alertness: Awake/alert Behavior During Therapy: WFL for tasks assessed/performed Overall Cognitive Status: Difficult to assess                                 General Comments: followed all commands. pt attempting to communicate through mouthing words but difficult to decipher        Exercises      General Comments        Pertinent Vitals/Pain Pain Assessment Pain Assessment: Faces Faces Pain Scale: No hurt    Home Living                          Prior Function  PT Goals (current goals can now be found in the care plan section) Acute Rehab PT Goals Patient Stated Goal: none stated Potential to Achieve Goals: Good Progress towards PT goals: Progressing toward goals    Frequency    Min 2X/week      PT Plan Current plan remains appropriate    Co-evaluation              AM-PAC PT "6 Clicks" Mobility   Outcome Measure  Help needed turning from your back to your side while in a flat bed without using bedrails?: None Help needed moving from lying on your back to sitting  on the side of a flat bed without using bedrails?: A Little Help needed moving to and from a bed to a chair (including a wheelchair)?: A Little Help needed standing up from a chair using your arms (e.g., wheelchair or bedside chair)?: A Little Help needed to walk in hospital room?: A Little Help needed climbing 3-5 steps with a railing? : A Little 6 Click Score: 19    End of Session Equipment Utilized During Treatment: Gait belt Activity Tolerance: Patient tolerated treatment well Patient left: in bed;with call bell/phone within reach;with bed alarm set Nurse Communication: Mobility status PT Visit Diagnosis: Other abnormalities of gait and mobility (R26.89)     Time: 7062-3762 PT Time Calculation (min) (ACUTE ONLY): 22 min  Charges:  $Therapeutic Activity: 8-22 mins                     Lillia Pauls, PT, DPT Acute Rehabilitation Services Office [phone]    Evan Jacobs 08/17/2022, 5:16 PM

## 2022-08-17 NOTE — Progress Notes (Signed)
SLP Cancellation Note  Patient Details Name: Evan Jacobs MRN: [id number] DOB: Nov 27, 1950   Cancelled treatment:       Reason Eval/Treat Not Completed: Other (comment) Pts case managed by GI. No SLP f/u needed.    Delayza Lungren, Riley Nearing 08/17/2022, 8:45 AM

## 2022-08-18 DIAGNOSIS — R112 Nausea with vomiting, unspecified: Secondary | ICD-10-CM | POA: Diagnosis not present

## 2022-08-18 LAB — BASIC METABOLIC PANEL
Anion gap: 6 (ref 5–15)
BUN: 6 mg/dL — ABNORMAL LOW (ref 8–23)
CO2: 27 mmol/L (ref 22–32)
Calcium: 8.7 mg/dL — ABNORMAL LOW (ref 8.9–10.3)
Chloride: 103 mmol/L (ref 98–111)
Creatinine, Ser: 0.84 mg/dL (ref 0.61–1.24)
GFR, Estimated: >60 mL/min (ref >60)
Glucose, Bld: 89 mg/dL (ref 70–99)
Potassium: 4.1 mmol/L (ref 3.5–5.1)
Sodium: 136 mmol/L (ref 135–145)

## 2022-08-18 LAB — CREATININE, SERUM
Creatinine, Ser: 0.89 mg/dL (ref 0.61–1.24)
GFR, Estimated: >60 mL/min (ref >60)

## 2022-08-18 MED ORDER — ENSURE ENLIVE PO LIQD
237.0000 mL | Freq: Every day | ORAL | Status: DC
  Administered 2022-08-19 – 2022-09-03 (×54): 237 mL via ORAL
  Filled 2022-08-18 (×7): qty 237

## 2022-08-18 MED ORDER — PANTOPRAZOLE SODIUM 40 MG PO TBEC: 40.0000 mg | DELAYED_RELEASE_TABLET | Freq: Two times a day (BID) | ORAL | 11 refills | Status: AC

## 2022-08-18 MED ORDER — PANTOPRAZOLE SODIUM 40 MG PO TBEC
40.0000 mg | DELAYED_RELEASE_TABLET | Freq: Two times a day (BID) | ORAL | Status: DC
  Administered 2022-08-18 – 2022-09-03 (×32): 40 mg via ORAL
  Filled 2022-08-18 (×32): qty 1

## 2022-08-18 MED ORDER — TAMSULOSIN HCL 0.4 MG PO CAPS: 0.4000 mg | ORAL_CAPSULE | Freq: Every day | ORAL | 0 refills | Status: AC

## 2022-08-18 MED ORDER — AMLODIPINE BESYLATE 5 MG PO TABS: 5.0000 mg | ORAL_TABLET | Freq: Every day | ORAL | 0 refills | Status: AC

## 2022-08-18 MED ORDER — CHLORPROMAZINE HCL 25 MG PO TABS: 25.0000 mg | ORAL_TABLET | Freq: Three times a day (TID) | ORAL | 0 refills | Status: AC | PRN

## 2022-08-18 MED ORDER — SENNA 8.6 MG PO TABS: 1.0000 | ORAL_TABLET | Freq: Two times a day (BID) | ORAL | 0 refills | Status: AC

## 2022-08-18 MED ORDER — FINASTERIDE 5 MG PO TABS: 5.0000 mg | ORAL_TABLET | Freq: Every day | ORAL | 0 refills | Status: AC

## 2022-08-18 MED ORDER — GUAIFENESIN 100 MG/5ML PO LIQD: 15.0000 mL | Freq: Two times a day (BID) | ORAL | 0 refills | Status: AC

## 2022-08-18 MED ORDER — IPRATROPIUM-ALBUTEROL 0.5-2.5 (3) MG/3ML IN SOLN: 3.0000 mL | RESPIRATORY_TRACT | 0 refills | Status: AC | PRN

## 2022-08-18 NOTE — Progress Notes (Signed)
Occupational Therapy Treatment and Discharge  Patient Details Name: Evan Jacobs MRN: [id number] DOB: 06/03/1951 Today's Date: 08/18/2022   History of present illness 71 y.o. male who presented to the ED for vomiting. Workup pending. 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   OT comments  Upon therapy arrival, pt returning to bed from using [facility] for toileting without staff assisting. Pt demonstrates improvement with overall functional performance and transfers. Pt was able to safely complete LB bathing, toileting, and toilet transfer during session without LOB or safety concern. All therapy goals met and/or adequate for discharge. Pt does not demonstrate any additional acute OT needs at this time. Will defer further OT services to next venue of care; will sign off.     Recommendations for follow up therapy are one component of a multi-disciplinary discharge planning process, led by the attending physician.  Recommendations may be updated based on patient status, additional functional criteria and insurance authorization.    Follow Up Recommendations  Skilled nursing-short term rehab (<3 hours/day)     Assistance Recommended at Discharge PRN  Patient can return home with the following  A little help with walking and/or transfers;A little help with bathing/dressing/bathroom;Help with stairs or ramp for entrance;Assistance with cooking/housework   Equipment Recommendations  None recommended by OT       Precautions / Restrictions Precautions Precautions: Fall Precaution Comments: trach Restrictions Weight Bearing Restrictions: No       Mobility Bed Mobility       Patient Response: Cooperative  Transfers Overall transfer level: Needs assistance Equipment used:  None Transfers: Sit to/from Stand, Bed to chair/wheelchair/BSC Sit to Stand: Modified independent (Device/Increase time) Stand pivot transfers: Modified independent (Device/Increase time)         Balance Overall balance assessment: No apparent balance deficits (not formally assessed) Sitting-balance support: No upper extremity supported, Feet supported Sitting balance-Leahy Scale: Good     Standing balance support: No upper extremity supported, During functional activity Standing balance-Leahy Scale: Good         ADL either performed or assessed with clinical judgement   ADL       Grooming: Wash/dry face;Set up;Sitting;Wash/dry hands       Lower Body Bathing: Set up;Sit to/from stand   Upper Body Dressing : Set up;Sitting       Toilet Transfer: Modified Independent;BSC/3in1;Stand-pivot   Toileting- Clothing Manipulation and Hygiene: Modified independent;Sitting/lateral lean;Sit to/from stand                Cognition Arousal/Alertness: Awake/alert Behavior During Therapy: WFL for tasks assessed/performed Overall Cognitive Status: Within Functional Limits for tasks assessed                       Pertinent Vitals/ Pain       Pain Assessment Pain Assessment: No/denies pain         Frequency  Min 2X/week        Progress Toward Goals  OT Goals(current goals can now be found in the care plan section)  Progress towards OT goals: Goals met/education completed, patient discharged from [facility] All goals met and education completed, patient discharged from [facility] OT "6 Clicks" Daily Activity     Outcome Measure   Help from another person eating  meals?: None Help from another person taking care of personal grooming?: None Help from another person toileting, which includes using toliet, bedpan, or urinal?: None Help from another person bathing (including washing, rinsing, drying)?: None Help from another person to put on  and taking off regular upper body clothing?: None Help from another person to put on and taking off regular lower body clothing?: None 6 Click Score: 24    End of Session    OT Visit Diagnosis: Unsteadiness on feet (R26.81);Other abnormalities of gait and mobility (R26.89);Muscle weakness (generalized) (M62.81)   Activity Tolerance Patient tolerated treatment well   Patient Left in bed;with call bell/phone within reach           Time: 0256-1548 OT Time Calculation (min): 21 min  Charges: OT General Charges $OT Visit: 1 Visit OT Treatments $Self Care/Home Management : 8-22 mins  Evan Jacobs, OTR/L,CBIS  Supplemental OT - [facility] and [facility]   Evan Jacobs, Evan Jacobs 08/18/2022, 1:15 PM

## 2022-08-18 NOTE — Discharge Summary (Deleted)
Physician Discharge Summary   Patient: Evan Jacobs MRN: [id number] DOB: 1951/08/08  Admit date:     08/11/2022  Discharge date: 09/02/22  Discharge Physician: Shelly Coss   PCP: Patient, No Pcp Per   Recommendations at discharge:   Needs to follow up with GI in 4-6 weeks--will need further esophageal dilation.  Needs follow up with endocrinologist   Discharge Diagnoses: Principal Problem:   Vomiting Active Problems:   Seizures ([place])   Acute tracheostomy management ([place])   Hypokalemia   Acute on chronic urinary retention   Esophageal stricture   Dysphagia   Constipation   Tracheostomy care [facility])   Primary hypertension  Resolved Problems:   * No resolved hospital problems. 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 Patient had a recent ER visit on 07/26/2022 for the same symptoms. Family also concerned that they are not able to care for him and his tracheostomy needs at home.   Assessment and Plan: 1-Recurrent  nausea and vomiting: in setting esophageal stricture:  -CT abdomen pelvis: No CT evidence of acute. Moderate amount of retained colonic stool suggesting constipation. No evidence of bowel obstruction. -PRN zofran ordered. Change Reglan to PRN. -IV Protonix BID. He will need high dose PPI for several weeks, then repeat endoscopy for dilation.  -Cortisol level. 3--  ACTH negative -Endoscopy: 2 cm hiatal hernia, benign-appearing severe esophageal stenosis.  Dilated to 9 mm with appropriate mucosal wrents.  No further dilation due to finding an ulceration at the site. -GI is recommending full liquid diet, patient should not to be on solid fluid until the stricture can be dilated further. -He will need high dose PPI BID for several weeks  then repeat endoscopy for dilation.  -Continue with full liquid diet.  He will need to be discharge on Full Liquid diet  Discharge on PPI BID.   2-Hypotension; Related to hypovolemia.  Hold BP medications.  Improved with fluids  Resolved.  ACTH test: cortisol base 7,6---16 after 60 minutes.  BP improved. BP medication resume. Less likely Adrenal insufficiency.   Acute tracheostomy management: Continue with trach care.  RT consult. Nebulizer PRN. Flutter valve, guaifenesin ordered   Seizure disorder: History of seizure following CVA. Continue with Keppra.    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  Bladder scan every shift.    Hypokalemia; Replaced  History of SIADH and frequent episode of hypokalemia   Constipation;  on senna.  Had BM   Hypomagnesemia;Replaced.    Estimated body mass index is 20.5 kg/m as calculated from the following:   Height as of this encounter: 5\' 6"  (1.676 m).   Weight as of this encounter: 57.6 kg.         Consultants: GI Procedures performed: Endoscopy  Disposition: Skilled nursing facility Diet recommendation:  Discharge Diet Orders (From admission, onward)    Full liquid diet DISCHARGE MEDICATION: Allergies as of 09/02/2022   No Active Allergies      Medication List     STOP taking these medications    lisinopril 5 MG tablet Commonly known as: ZESTRIL   metoprolol tartrate 50 MG tablet Commonly known as: LOPRESSOR       TAKE these medications    amLODipine 5 MG tablet Commonly known  as: NORVASC Take 1 tablet (5 mg total) by mouth daily. What changed:  medication strength how much to take   chlorproMAZINE 25 MG tablet Commonly known as: THORAZINE Take 1 tablet (25 mg total) by mouth 3 (three) times daily as needed for vomiting. What changed:  when to take this reasons to take this   feeding supplement  Liqd Take 237 mLs by mouth 5 (five) times daily.   finasteride 5 MG tablet Commonly known as: PROSCAR Take 1 tablet (5 mg total) by mouth daily.   folic acid 1 MG tablet Commonly known as: FOLVITE Take 1 tablet (1 mg total) by mouth daily.   guaiFENesin 100 MG/5ML liquid Commonly known as: ROBITUSSIN Take 15 mLs by mouth 2 (two) times daily.   ipratropium-albuterol 0.5-2.5 (3) MG/3ML Soln Commonly known as: DUONEB Take 3 mLs by nebulization every 4 (four) hours as needed.   iron polysaccharides 150 MG capsule Commonly known as: NIFEREX Take 1 capsule (150 mg total) by mouth daily.   levETIRAcetam 1000 MG tablet Commonly known as: KEPPRA Take 1 tablet (1,000 mg total) by mouth 2 (two) times daily.   Magnesium 250 MG Tabs Take 250 mg by mouth daily.   pantoprazole 40 MG tablet Commonly known as: Protonix Take 1 tablet (40 mg total) by mouth 2 (two) times daily. What changed: when to take this   senna 8.6 MG Tabs tablet Commonly known as: SENOKOT Take 1 tablet (8.6 mg total) by mouth 2 (two) times daily.   sodium chloride 1 g tablet Take 1 g by mouth 3 (three) times daily.   tamsulosin 0.4 MG Caps capsule Commonly known as: FLOMAX Take 1 capsule (0.4 mg total) by mouth daily after supper.   thiamine 100 MG tablet Commonly known as: VITAMIN B1 Take 1 tablet (100 mg total) by mouth daily.        Follow-up Information     [facility] Follow up in 1 week(s).   Contact information: [address] [place] [address] [phone]               Discharge Exam: Ceasar Mons Weights   08/11/22 2133 08/12/22 1157  Weight: 70.7 kg 57.6 kg   General; NAD   Condition at discharge: stable  The results of significant diagnostics from this hospitalization (including imaging, microbiology, ancillary and laboratory) are listed below for reference.   Imaging Studies: DG CHEST PORT 1 VIEW  Result Date:  08/13/2022 CLINICAL DATA:  Hypoxemia. EXAM: PORTABLE CHEST 1 VIEW COMPARISON:  August 11, 2022. FINDINGS: Stable cardiomediastinal silhouette. Tracheostomy tube is unchanged. Minimal left basilar subsegmental atelectasis. Right lung is clear. Bony thorax is unremarkable. IMPRESSION: Minimal left basilar subsegmental atelectasis. Electronically Signed   By: Lupita Raider M.D.   On: 08/13/2022 13:43   US PELVIS LIMITED (TRANSABDOMINAL ONLY)  Result Date: 08/11/2022 CLINICAL DATA:  Urinary retention EXAM: ULTRASOUND OF THE MALE PELVIS COMPARISON:  CT 5:20 p.m. FINDINGS: Bladder: The bladder is partially decompressed and is unremarkable; no intraluminal mass or debris is identified. Bilateral ureteral jets are identified. The prevoid volume is 149 cc. The patient was unable to spontaneously void. Prostate gland:  4.3 x 2.1 x 2.2 cm (volume = 10 cm^3). Seminal vesicles:  Unremarkable IMPRESSION: 1. Unremarkable examination of the urinary bladder. Note, the patient was unable to spontaneously void. Electronically Signed   By: Helyn Numbers M.D.   On: 08/11/2022 22:45   CT ABDOMEN PELVIS W CONTRAST  Result Date: 08/11/2022 CLINICAL DATA:  Epigastric pain EXAM: CT ABDOMEN AND PELVIS WITH CONTRAST TECHNIQUE: Multidetector CT imaging of the abdomen and pelvis was performed using the standard protocol following bolus administration of intravenous contrast. RADIATION DOSE REDUCTION: This exam was performed according to the departmental dose-optimization program which includes automated exposure control, adjustment of the mA and/or kV according to patient size and/or use of iterative reconstruction technique. CONTRAST:  21mL OMNIPAQUE IOHEXOL 350 MG/ML SOLN COMPARISON:  CT examination dated April 22, 2021 FINDINGS: Lower chest: Large hiatal hernia.  Bibasilar atelectasis. Hepatobiliary: No focal liver abnormality is seen. No gallstones, gallbladder wall thickening, or biliary dilatation. Pancreas: Unremarkable.  No pancreatic ductal dilatation or surrounding inflammatory changes. Spleen: Normal in size without focal abnormality. Adrenals/Urinary Tract: Adrenal glands are unremarkable. Bilateral simple renal cysts. Kidneys are normal, without renal calculi, focal lesion, or hydronephrosis. Bladder is unremarkable. Stomach/Bowel: Stomach is within normal limits. Appendix appears normal. No evidence of bowel wall thickening, distention, or inflammatory changes. Moderate amount of retained colonic stool suggesting constipation. Vascular/Lymphatic: No significant vascular findings are present. No enlarged abdominal or pelvic lymph nodes. Reproductive: Prostate is unremarkable. Other: No abdominal wall hernia or abnormality. No abdominopelvic ascites. Musculoskeletal: No acute or significant osseous findings. IMPRESSION: 1. No CT evidence of acute abdominal/pelvic process. 2. Large hiatal hernia. 3. Moderate amount of retained colonic stool suggesting constipation. No evidence of bowel obstruction. 4. Bilateral simple renal cysts. No evidence of nephrolithiasis or hydronephrosis. Electronically Signed   By: Keane Police D.O.   On: 08/11/2022 17:44   DG Chest Portable 1 View  Result Date: 08/11/2022 CLINICAL DATA:  Vomiting, evaluate for aspiration EXAM: PORTABLE CHEST 1 VIEW COMPARISON:  07/26/2022 FINDINGS: Transverse diameter of heart is increased. Thoracic aorta is tortuous. Lung fields are clear of any infiltrates or pulmonary edema. There is no pleural effusion or pneumothorax. Tip of tracheostomy is 3.2 cm above the carina. IMPRESSION: No active disease. Electronically Signed   By: Elmer Picker M.D.   On: 08/11/2022 11:21    Microbiology: Results for orders placed or performed during the hospital encounter of 08/11/22  Resp panel by RT-PCR (RSV, Flu A&B, Covid) Anterior Nasal Swab     Status: None   Collection Time: 08/11/22  3:31 PM   Specimen: Anterior Nasal Swab  Result Value Ref Range Status   SARS  Coronavirus 2 by RT PCR NEGATIVE NEGATIVE Final    Comment: (NOTE) SARS-CoV-2 target nucleic acids are NOT DETECTED.  The SARS-CoV-2 RNA is generally detectable in upper respiratory specimens during the acute phase of infection. The lowest concentration of SARS-CoV-2 viral copies this assay can detect is 138 copies/mL. A negative result does not preclude SARS-Cov-2 infection and should not be used as the sole basis for treatment or other patient management decisions. A negative result may occur with  improper specimen collection/handling, submission of specimen other than nasopharyngeal swab, presence of viral mutation(s) within the areas targeted by this assay, and inadequate number of viral copies(<138 copies/mL). A negative result must be combined with clinical observations, patient history, and epidemiological information. The expected result is Negative.  Fact Sheet for Patients:  EntrepreneurPulse.com.au  Fact Sheet for Healthcare Providers:  IncredibleEmployment.be  This test is no t yet approved or cleared by the [place] FDA and  has been authorized for detection and/or diagnosis of SARS-CoV-2 by FDA under an Emergency Use Authorization (EUA). This EUA will remain  in effect (meaning this test can be used) for the duration of the  COVID-19 declaration under Section 564(b)(1) of the Act, 21 U.S.C.section 360bbb-3(b)(1), unless the authorization is terminated  or revoked sooner.       Influenza A by PCR NEGATIVE NEGATIVE Final   Influenza B by PCR NEGATIVE NEGATIVE Final    Comment: (NOTE) The Xpert Xpress SARS-CoV-2/FLU/RSV plus assay is intended as an aid in the diagnosis of influenza from Nasopharyngeal swab specimens and should not be used as a sole basis for treatment. Nasal washings and aspirates are unacceptable for Xpert Xpress SARS-CoV-2/FLU/RSV testing.  Fact Sheet for  Patients: EntrepreneurPulse.com.au  Fact Sheet for Healthcare Providers: IncredibleEmployment.be  This test is not yet approved or cleared by the [place] FDA and has been authorized for detection and/or diagnosis of SARS-CoV-2 by FDA under an Emergency Use Authorization (EUA). This EUA will remain in effect (meaning this test can be used) for the duration of the COVID-19 declaration under Section 564(b)(1) of the Act, 21 U.S.C. section 360bbb-3(b)(1), unless the authorization is terminated or revoked.     Resp Syncytial Virus by PCR NEGATIVE NEGATIVE Final    Comment: (NOTE) Fact Sheet for Patients: EntrepreneurPulse.com.au  Fact Sheet for Healthcare Providers: IncredibleEmployment.be  This test is not yet approved or cleared by the [place] FDA and has been authorized for detection and/or diagnosis of SARS-CoV-2 by FDA under an Emergency Use Authorization (EUA). This EUA will remain in effect (meaning this test can be used) for the duration of the COVID-19 declaration under Section 564(b)(1) of the Act, 21 U.S.C. section 360bbb-3(b)(1), unless the authorization is terminated or revoked.  Performed at [facility], [place] [address], [place], [place] [address]   Surgical pcr screen     Status: Abnormal   Collection Time: 08/13/22 10:28 PM   Specimen: Nasal Mucosa; Nasal Swab  Result Value Ref Range Status   MRSA, PCR POSITIVE (A) NEGATIVE Final   Staphylococcus aureus POSITIVE (A) NEGATIVE Final    Comment: RESULT CALLED TO, READ BACK BY AND VERIFIED WITH: (NOTE) The Xpert SA Assay (FDA approved for NASAL specimens in patients 46 years of age and older), is one component of a comprehensive surveillance program. It is not intended to diagnose infection nor to guide or monitor treatment. Performed at [facility], [place] [address], [place], [place] [address]     Labs: CBC: No  results for input(s): "WBC", "NEUTROABS", "HGB", "HCT", "MCV", "PLT" in the last 168 hours.  Basic Metabolic Panel: Recent Labs  Lab 08/31/22 0247 08/31/22 1144 09/01/22 0503 09/02/22 0249 09/02/22 0720  NA 122* 125* 130* 134*  --   K 3.9  --  4.2 5.3* 4.2  CL 87*  --  96* 103  --   CO2 21*  --  23 21*  --   GLUCOSE 97  --  125* 104*  --   BUN 13  --  11 13  --   CREATININE 0.68  --  0.81 0.77  --   CALCIUM 8.9  --  8.8* 9.0  --    Liver Function Tests: No results for input(s): "AST", "ALT", "ALKPHOS", "BILITOT", "PROT", "ALBUMIN" in the last 168 hours.  CBG: No results for input(s): "GLUCAP" in the last 168 hours.  Discharge time spent: greater than 30 minutes.  Signed: Shelly Coss, MD [facility] 09/02/2022

## 2022-08-18 NOTE — TOC Progression Note (Signed)
Transition of Care [facility]) - Progression Note    Patient Details  Name: Evan Jacobs MRN: [id number] Date of Birth: 20-Oct-1950  Transition [facility]) CM/SW Contact  Janae Bridgeman, RN Phone Number: 08/18/2022, 2:37 PM  Clinical Narrative:    CM called and spoke with Alphonzo Lemmings, Admissions director with [facility] and they do not have an available male bed at the facility but their sister facility - [facility] may have an available bed in [place].  I explained to the patient that he he will need to use his LTC Medicaid and disability check of $700.00 per month to provide care at the facility.  The patient gave me permission to update his significant other and daughter on the phone.  I called the patient's significant other and daughter by phone and the daughter states that her mother is unable to care for the patient in the home.  The patient does not care for his own trach in the home and they are in agreement for patient's admission to a LTC facility.  I called and spoke with Whitney, Admission director with [facility] and they plant to assess the patient and speak with him at the bedside in the next 1-2 days to discuss the admission.  The attending physician, Dr. Sunnie Nielsen was updated.  Discharge summary  and orders were placed today by attending physician and she was updated that I do not have a LTC facility for patient to discharge at this time until [facility] offers a bed.   Expected Discharge Plan: Long Term Nursing Home Barriers to Discharge: Other (must enter comment) (Pending bed offer at [facility] L[facility] home)  Expected Discharge Plan and Services Expected Discharge Plan: Long Term Nursing Home   Discharge Planning Services: CM Consult Post Acute Care Choice: Nursing Home Living arrangements for the past 2 months: Apartment (Patient was living with his girlfriend, [name] for past 3 years) Expected Discharge Date: 08/18/22                                      Social Determinants of Health (SDOH) Interventions    Readmission Risk Interventions    08/18/2022    2:36 PM  Readmission Risk Prevention Plan  Transportation Screening Complete  PCP or Specialist Appt within 5-7 Days Complete  Home Care Screening Complete  Medication Review (RN CM) Complete

## 2022-08-18 NOTE — Progress Notes (Signed)
Nutrition Follow-up  DOCUMENTATION CODES:   Not applicable  INTERVENTION:  - Increase Ensure Enlive po to 5x daily, each supplement provides 350 kcal and 20 grams of protein.  - Add Magic cup TID with meals, each supplement provides 290 kcal and 9 grams of protein.   NUTRITION DIAGNOSIS:   Inadequate oral intake related to inability to eat, nausea, vomiting as evidenced by per patient/family report, NPO status.  Meds reviewed: folic acid, mag-ox, senokot, thiamine.   GOAL:   Patient will meet greater than or equal to 90% of their needs  MONITOR:   PO intake, Supplement acceptance, Diet advancement  REASON FOR ASSESSMENT:   Malnutrition Screening Tool    ASSESSMENT:   71 y.o. male admits related to vomiting. PMH includes: acid reflux, CVA, gout, HTN, laryngeal stenosis, seizures, urinary retention. Pt is currently receiving medical management for recurrent nausea/vomiting.  Meds reviewed: folic acid, mag-ox, senokot, thiamine. Labs reviewed.    The pt reports that he does not really like the full liquid diet and is not eating much of the food. He does report that he has been drinking Ensure TID. Pt states that he would like 5 Ensure per day. Pt also states that he would like to try Magic Cup with meals.   NUTRITION - FOCUSED PHYSICAL EXAM:  Flowsheet Row Most Recent Value  Orbital Region Mild depletion  Upper Arm Region Mild depletion  Thoracic and Lumbar Region Unable to assess  Buccal Region Mild depletion  Temple Region Moderate depletion  Clavicle Bone Region Moderate depletion  Clavicle and Acromion Bone Region Moderate depletion  Scapular Bone Region Unable to assess  Dorsal Hand Moderate depletion  Patellar Region Mild depletion  Anterior Thigh Region Mild depletion  Posterior Calf Region Mild depletion  Edema (RD Assessment) None  Hair Reviewed  Eyes Reviewed  Mouth Reviewed  Skin Reviewed  Nails Reviewed       Diet Order:   Diet Order              Diet - low sodium heart healthy           Diet full liquid Room service appropriate? Yes; Fluid consistency: Thin  Diet effective now                   EDUCATION NEEDS:   Not appropriate for education at this time  Skin:  Skin Assessment: Reviewed RN Assessment  Last BM:  08/18/22  Height:   Ht Readings from Last 1 Encounters:  08/12/22 5\' 6"  (1.676 m)    Weight:   Wt Readings from Last 1 Encounters:  08/12/22 57.6 kg    Ideal Body Weight:     BMI:  Body mass index is 20.5 kg/m.  Estimated Nutritional Needs:   Kcal:  1440-1725 kcals  Protein:  70-85 gm  Fluid:  >/= 1.4 L  08/14/22, RD, LDN, CNSC.

## 2022-08-18 NOTE — Progress Notes (Signed)
Mobility Specialist Progress Note    08/18/22 1203  Mobility  Activity Refused mobility   No reason given. Will f/u as schedule permits.    [place] Mobility Specialist  Please Neurosurgeon or Rehab Office at [phone]

## 2022-08-18 NOTE — Care Management Important Message (Signed)
Important Message  Patient Details  Name: Evan Jacobs MRN: [id number] Date of Birth: 1951/01/09   Medicare Important Message Given:  Yes     [name] Stefan Church 08/18/2022, 4:29 PM

## 2022-08-19 DIAGNOSIS — R112 Nausea with vomiting, unspecified: Secondary | ICD-10-CM | POA: Diagnosis not present

## 2022-08-19 LAB — BASIC METABOLIC PANEL
Anion gap: 9 (ref 5–15)
BUN: 5 mg/dL — ABNORMAL LOW (ref 8–23)
CO2: 21 mmol/L — ABNORMAL LOW (ref 22–32)
Calcium: 8.7 mg/dL — ABNORMAL LOW (ref 8.9–10.3)
Chloride: 104 mmol/L (ref 98–111)
Creatinine, Ser: 0.8 mg/dL (ref 0.61–1.24)
GFR, Estimated: >60 mL/min (ref >60)
Glucose, Bld: 90 mg/dL (ref 70–99)
Potassium: 5 mmol/L (ref 3.5–5.1)
Sodium: 134 mmol/L — ABNORMAL LOW (ref 135–145)

## 2022-08-19 LAB — MAGNESIUM: Magnesium: 1.6 mg/dL — ABNORMAL LOW (ref 1.7–2.4)

## 2022-08-19 MED ORDER — MAGNESIUM SULFATE 2 GM/50ML IV SOLN: 2.0000 g | Freq: Once | INTRAVENOUS | Status: DC

## 2022-08-19 MED ORDER — ENSURE ENLIVE PO LIQD: 237.0000 mL | Freq: Every day | ORAL | 12 refills | Status: AC

## 2022-08-19 MED ORDER — MAGNESIUM OXIDE -MG SUPPLEMENT 400 (240 MG) MG PO TABS
600.0000 mg | ORAL_TABLET | Freq: Once | ORAL | Status: AC
  Administered 2022-08-19: 600 mg via ORAL
  Filled 2022-08-19: qty 2

## 2022-08-19 NOTE — Progress Notes (Signed)
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  CT abdomen/pelvis showed moderate amount of retained colonic stool, no bowel obstruction.  Underwent GI evaluation, endoscopy showed 2 cm hiatal hernia, benign-appearing severe cervical stenosis status post dilation.  GI recommending full liquid diet, PPI twice daily and repeat endoscopy for dilation as an outpatient.  PT/OT recommending SNF.  Medically stable for discharge.  Discharge summary and orders done.   Assessment & Plan:  Principal Problem:   Vomiting Active Problems:   Seizures ([place])   Acute tracheostomy management ([facility])   Hypokalemia   Acute on chronic urinary retention   Esophageal stricture   Dysphagia      -Recurrent  nausea and vomiting: in setting esophageal stricture:  -CT abdomen pelvis: No CT evidence of acute. Moderate amount of retained colonic stool suggesting constipation. No evidence of bowel obstruction. -PRN zofran ordered. Change Reglan to PRN. -IV Protonix BID. He will need high dose PPI for several weeks, then repeat endoscopy for dilation.  -Cortisol level. 3--  ACTH negative -Endoscopy: 2 cm hiatal hernia, benign-appearing severe esophageal stenosis.  Dilated to 9 mm with appropriate mucosal wrents.  No further dilation due to finding an ulceration at the site. -GI is recommending full liquid diet, patient should not to be on solid fluid until the stricture can be dilated further. -He will need high dose PPI BID for several weeks then repeat endoscopy for  dilation.  -Continue with full liquid diet.  He will need to be discharge on Full Liquid diet  Discharge on PPI BID.    2-Hypotension; Related to hypovolemia.  Hold BP medications.  Improved with fluids  Resolved.  ACTH test: cortisol base 7,6---16 after 60 minutes.  BP improved. BP medication resume. Less likely Adrenal insufficiency.    Acute tracheostomy management: Continue with trach care.  RT consult. Nebulizer PRN. Flutter valve, guaifenesin ordered   Seizure disorder: History of seizure following CVA. Continue with Keppra.    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  Bladder scan every shift.    Hypokalemia; Replaced   History of SIADH and frequent episode of hypokalemia   Constipation;  on senna.  Had BM   Hypomagnesemia;Replaced.  Continue supplementation.    DVT prophylaxis:enoxaparin (LOVENOX) injection 40 mg Start: 08/14/22 2200     Code Status: Full Code  Family Communication: None at the bed side  Patient status: Inpatient  Patient is from : Home  Anticipated discharge to: SNF  Estimated DC date: whenever possile   Consultants: gI  Procedures:EGD  Antimicrobials:  Anti-infectives (From admission, onward)    None       Subjective: Patient seen and examined at bedside today.  Hemodynamically stable lying in bed.  On room air.  Has some secretion on the trach but otherwise does not complain of any shortness of breath or cough.  Objective: Vitals:   08/19/22 0334 08/19/22 0710 08/19/22 0743 08/19/22 1008  BP: (!) 121/53  135/65   Pulse: 83 80 73 82  Resp:  16  15 16  Temp: 100.2 F (37.9 C)     TempSrc: Oral     SpO2: 98% 99% 96% 98%  Weight:      Height:        Intake/Output Summary (Last 24 hours) at 08/19/2022 1100 Last data filed at 08/19/2022 0935 Gross per 24 hour  Intake 840 ml  Output 1675 ml  Net -835 ml    Filed Weights   08/11/22 2133 08/12/22 1157  Weight: 70.7 kg 57.6 kg    Examination:  General exam: Overall comfortable, not in distress, deconditioned, chronically ill looking HEENT: PERRL, trach Respiratory system:  no wheezes or crackles  Cardiovascular system: S1 & S2 heard, RRR.  Gastrointestinal system: Abdomen is nondistended, soft and nontender. Central nervous system: Alert and oriented Extremities: No edema, no clubbing ,no cyanosis Skin: No rashes, no ulcers,no icterus     Data Reviewed: I have personally reviewed following labs and imaging studies  CBC: Recent Labs  Lab 08/13/22 0254 08/14/22 0506  WBC 3.7* 3.1*  HGB 10.1* 10.0*  HCT 31.9* 30.6*  MCV 81.0 79.9*  PLT 190 190   Basic Metabolic Panel: Recent Labs  Lab 08/14/22 0506 08/15/22 0912 08/16/22 0750 08/17/22 0733 08/18/22 0708 08/18/22 1453 08/19/22 0228  NA 139 138 137  --   --  136 134*  K 3.7 3.4* 4.1  --   --  4.1 5.0  CL 110 110 109  --   --  103 104  CO2 21* 22 23  --   --  27 21*  GLUCOSE 78 87 94  --   --  89 90  BUN <5* <5* 5*  --   --  6* 5*  CREATININE 0.84 0.83 0.82  --  0.89 0.84 0.80  CALCIUM 8.2* 8.0* 8.2*  --   --  8.7* 8.7*  MG  --   --  1.4* 1.8  --   --  1.6*     Recent Results (from the past 240 hour(s))  Resp panel by RT-PCR (RSV, Flu A&B, Covid) Anterior Nasal Swab     Status: None   Collection Time: 08/11/22  3:31 PM   Specimen: Anterior Nasal Swab  Result Value Ref Range Status   SARS Coronavirus 2 by RT PCR NEGATIVE NEGATIVE Final    Comment: (NOTE) SARS-CoV-2 target nucleic acids are NOT DETECTED.  The SARS-CoV-2 RNA is generally detectable in upper respiratory specimens during the acute phase of infection. The lowest concentration of SARS-CoV-2 viral copies this assay can detect is 138 copies/mL. A negative result does not preclude SARS-Cov-2 infection and should not be used as the sole basis for treatment or other patient management decisions. A  negative result may occur with  improper specimen collection/handling, submission of specimen other than nasopharyngeal swab, presence of viral mutation(s) within the areas targeted by this assay, and inadequate number of viral copies(<138 copies/mL). A negative result must be combined with clinical observations, patient history, and epidemiological information. The expected result is Negative.  Fact Sheet for Patients:  BloggerCourse.com  Fact Sheet for Healthcare Providers:  SeriousBroker.it  This test is no t yet approved or cleared by the [place] FDA and  has been authorized for detection and/or diagnosis of SARS-CoV-2 by FDA under an Emergency Use Authorization (EUA). This EUA will remain  in effect (meaning this test can be used) for the duration of the COVID-19 declaration under Section 564(b)(1) of the Act, 21 U.S.C.section 360bbb-3(b)(1), unless the authorization is terminated  or revoked sooner.       Influenza A by PCR NEGATIVE NEGATIVE Final   Influenza B by PCR NEGATIVE NEGATIVE Final    Comment: (NOTE) The Xpert Xpress SARS-CoV-2/FLU/RSV plus assay is intended as an aid in the diagnosis of influenza from Nasopharyngeal swab specimens and should not be used as a sole basis for treatment. Nasal washings and aspirates are unacceptable for Xpert Xpress SARS-CoV-2/FLU/RSV testing.  Fact Sheet for Patients: EntrepreneurPulse.com.au  Fact Sheet for Healthcare Providers: IncredibleEmployment.be  This test is not yet approved or cleared by the [place] FDA and has been authorized for detection and/or diagnosis of SARS-CoV-2 by FDA under an Emergency Use Authorization (EUA). This EUA will remain in effect (meaning this test can be used) for the duration of the COVID-19 declaration under Section 564(b)(1) of the Act, 21 U.S.C. section 360bbb-3(b)(1), unless the authorization  is terminated or revoked.     Resp Syncytial Virus by PCR NEGATIVE NEGATIVE Final    Comment: (NOTE) Fact Sheet for Patients: EntrepreneurPulse.com.au  Fact Sheet for Healthcare Providers: IncredibleEmployment.be  This test is not yet approved or cleared by the [place] FDA and has been authorized for detection and/or diagnosis of SARS-CoV-2 by FDA under an Emergency Use Authorization (EUA). This EUA will remain in effect (meaning this test can be used) for the duration of the COVID-19 declaration under Section 564(b)(1) of the Act, 21 U.S.C. section 360bbb-3(b)(1), unless the authorization is terminated or revoked.  Performed at [facility], [place] [address] [place], [place] [address]   Surgical pcr screen     Status: Abnormal   Collection Time: 08/13/22 10:28 PM   Specimen: Nasal Mucosa; Nasal Swab  Result Value Ref Range Status   MRSA, PCR POSITIVE (A) NEGATIVE Final   Staphylococcus aureus POSITIVE (A) NEGATIVE Final    Comment: RESULT CALLED TO, READ BACK BY AND VERIFIED WITH: (NOTE) The Xpert SA Assay (FDA approved for NASAL specimens in patients 95 years of age and older), is one component of a comprehensive surveillance program. It is not intended to diagnose infection nor to guide or monitor treatment. Performed at [facility], [place] [address], [place],  [address]      Radiology Studies: No results found.  Scheduled Meds:  amLODipine  5 mg Oral Daily   Chlorhexidine Gluconate Cloth  6 each Topical Daily   enoxaparin (LOVENOX) injection  40 mg Subcutaneous Q24H   feeding supplement  237 mL Oral 5 X Daily   finasteride  5 mg Oral Daily   folic acid  1 mg Oral Daily   guaiFENesin  15 mL Oral BID   levETIRAcetam  1,000 mg Oral BID   magnesium oxide  200 mg Oral Daily   mupirocin ointment   Nasal BID   pantoprazole  40 mg Oral Q12H   senna  1 tablet Oral BID   sodium chloride flush  3 mL Intravenous  Q12H   sodium chloride  1 g Oral TID   tamsulosin  0.4 mg Oral QPC supper   thiamine  100 mg Oral Daily   traZODone  50 mg Oral QHS   Continuous Infusions:  sodium chloride 250 mL (08/13/22 0545)     LOS: 8 days   Shelly Coss, MD [facility] P12/20/2023, 11:00 AM

## 2022-08-19 NOTE — TOC Progression Note (Addendum)
Transition of Care [facility]) - Progression Note    Patient Details  Name: Evan Jacobs MRN: [id number] Date of Birth: 05/11/1951  Transition of Care [facility] CM/SW [place], RN Phone Number: 08/19/2022, 11:42 AM  Clinical Narrative:    CM met with the patient at the bedside to discuss SNF placement and the patient states that he is agreeable to admit to a nursing home for short-term placement until he is able to care to his own trach at his apartment.  The patient states that he has been living with his girlfriend at her home and she was taking care of his trach needs but he is no longer able to return to her home nor continue to take care of his trach.  [facility] SNF will plan to visit with the patient at the bedside today or tomorrow to evaluate for SNF placement - no firm bed offer at this time.  I called and spoke with Amy, RNCM with [facility] is supportive of patient using his SNF benefits for SNF placement.  Bedside nursing plans to start teaching the patient to perform his own trach care.  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  CM called Whitney, CM with [facility] and alerted her that patient will be short-term SNF placement - pending bed offer and availability.  [facility] Medicare insurance auth will need to be started pending bed offer.  CM spoke with [facility], RT and she will follow up with the patient for trach teaching.  Trach team/ clinic to follow up with the patient tomorrow - 12/21.  The patient has 3 fingers on dominant hand that are amputated so teaching may be modified if needed/ able for trach care and suctioning technique.   Planned Disposition: Skilled Nursing Facility Barriers to Discharge: No SNF bed  Expected Discharge Plan and Services   Discharge  Planning Services: CM Consult Post Acute Care Choice: Nursing Home Living arrangements for the past 2 months: Apartment (Patient was living with his girlfriend, Evan Jacobs for past 3 years) Expected Discharge Date: 08/18/22                                     Social Determinants of Health (SDOH) Interventions [place]: No Food Insecurity (08/12/2022)  Housing: Low Risk  (08/12/2022)  Transportation Needs: Unmet Transportation Needs (08/12/2022)  Utilities: Not At Risk (08/12/2022)  Tobacco Use: Low Risk  (08/17/2022)    Readmission Risk Interventions    08/18/2022    2:36 PM  Readmission Risk Prevention Plan  Transportation Screening Complete  PCP or Specialist Appt within 5-7 Days Complete  Home Care Screening Complete  Medication Review (RN CM) Complete

## 2022-08-19 NOTE — Progress Notes (Signed)
Patient educated on trach care and asked him to teach back and perform the trach care. Pt requires more practice to solidify confidence with care.

## 2022-08-20 DIAGNOSIS — R112 Nausea with vomiting, unspecified: Secondary | ICD-10-CM | POA: Diagnosis not present

## 2022-08-20 MED ORDER — MAGNESIUM OXIDE -MG SUPPLEMENT 400 (240 MG) MG PO TABS
400.0000 mg | ORAL_TABLET | Freq: Once | ORAL | Status: AC
  Administered 2022-08-20: 400 mg via ORAL
  Filled 2022-08-20: qty 1

## 2022-08-20 MED ORDER — MAGNESIUM SULFATE IN D5W 1-5 GM/100ML-% IV SOLN
1.0000 g | Freq: Once | INTRAVENOUS | Status: DC
  Filled 2022-08-20: qty 100

## 2022-08-20 NOTE — Progress Notes (Signed)
Physical Therapy Treatment Patient Details Name: Evan Jacobs MRN: [id number] DOB: 05/23/1951 Today's Date: 08/20/2022   History of Present Illness 71 y.o. male who presented to the ED for vomiting. Workup pending. 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    PT Comments    Pt engaged in PT services with initiation of mobility and demo good ability and safety awareness with bed mobility demonstrating modified independent level requiring increased time.  Transfer training performed throughout hospital room to improve safety with mobility from various seat heights with supervision level for verbal cues in sequence for toileting transfers and from recliner chair to sequence UE push-off and safe transition to RW. 5 Times Sit to Stand Test performed from 20" seat height and able to complete without UE support x 22 seconds which indicates increased risk for falls and LE weakness.  Gait training performed with slow, but steady pattern and periodic standing rest periods due to fatigue traversing distance of 150 ft on level surfaces.  Vitals monitored throughout session and O2 sats maintained 94% or better during mobility.    Recommendations for follow up therapy are one component of a multi-disciplinary discharge planning process, led by the attending physician.  Recommendations may be updated based on patient status, additional functional criteria and insurance authorization.  Follow Up Recommendations  Home health PT Can patient physically be transported by private vehicle: Yes   Assistance Recommended at Discharge Intermittent Supervision/Assistance  Patient can return home with the following A little help with walking and/or transfers;A little help with bathing/dressing/bathroom   Equipment  Recommendations  None recommended by PT    Recommendations for Other Services       Precautions / Restrictions       Mobility  Bed Mobility Overal bed mobility: Modified Independent       Supine to sit: Modified independent (Device/Increase time) Sit to supine: Modified independent (Device/Increase time)        Transfers     Transfers: Sit to/from Stand, Bed to chair/wheelchair/BSC Sit to Stand: Supervision Stand pivot transfers: Supervision              Ambulation/Gait Ambulation/Gait assistance: Min guard Gait Distance (Feet): 150 Feet Assistive device: Rolling walker (2 wheels) Gait Pattern/deviations: Step-to pattern, Decreased step length - left, Decreased dorsiflexion - left Gait velocity: decreased         Stairs             Wheelchair Mobility    Modified Rankin (Stroke Patients Only)       Balance                                            Cognition                                                Exercises      General Comments        Pertinent Vitals/Pain      Home Living  Prior Function            PT Goals (current goals can now be found in the care plan section) Acute Rehab PT Goals PT Goal Formulation: With patient Time For Goal Achievement: 08/26/22 Potential to Achieve Goals: Good    Frequency    Min 2X/week      PT Plan Discharge plan needs to be updated    Co-evaluation              AM-PAC PT "6 Clicks" Mobility   Outcome Measure  Help needed turning from your back to your side while in a flat bed without using bedrails?: None Help needed moving from lying on your back to sitting on the side of a flat bed without using bedrails?: None Help needed moving to and from a bed to a chair (including a wheelchair)?: A Little Help needed standing up from a chair using your arms (e.g., wheelchair or bedside chair)?: A  Little Help needed to walk in hospital room?: A Little Help needed climbing 3-5 steps with a railing? : A Little 6 Click Score: 20    End of Session         PT Visit Diagnosis: Other abnormalities of gait and mobility (R26.89)     Time: 9628-3662 PT Time Calculation (min) (ACUTE ONLY): 22 min  Charges:  $Gait Training: 8-22 mins                     10:05 AM, 08/20/22 M. Shary Decamp, PT, DPT Physical Therapist- [place] Office Number: [phone]

## 2022-08-20 NOTE — Progress Notes (Signed)
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 CT abdomen/pelvis showed moderate amount of retained colonic stool, no bowel obstruction. Underwent GI evaluation, endoscopy showed 2 cm hiatal hernia, benign-appearing severe cervical stenosis status post dilation. GI recommending full liquid diet, PPI twice daily and repeat endoscopy for dilation as an outpatient. PT/OT recommending SNF.Medically stable for discharge.Discharge summary and orders done.    Subjective: Seen and examined Alert awake Resting. Pain controlled, has some coughing w/ trach, tolerating liquid diet   Assessment and Plan: Principal Problem:   Vomiting Active Problems:   Seizures (HCC)   Acute tracheostomy management (HCC)   Hypokalemia   Acute on chronic urinary retention   Esophageal stricture   Dysphagia   Recurrent  nausea and vomiting Esophageal stricture:  Patient had CT abdomen pelvis:No acute finding,showed moderate amount of retained colonic stool suggesting constipation.Cortisol level. 3--  ACTH negative.Endoscopy: 2 cm hiatal hernia, benign-appearing severe esophageal stenosis.  Dilated to 9 mm with appropriate mucosal wrents.  No further dilation due to finding an ulceration at the site. -GI is recommending full liquid diet, patient should not to be on solid fluid until the stricture can be dilated further. -He will need high dose PPI BID for several weeks then repeat endoscopy for dilation.Tolerting diet  Hypovolemic hypotension : BP has stabilized.Improved  with fluids ACTH test: cortisol base 7,6---16 after 60 minutes> less likely adrenal insufficiency.  Currently back on amlodipine 5 mg, on Flomax.     S/P tracheostomy management:Continue with trach care.  Followed by RT continue nebulizer flutter valve antitussives.   Seizure disorder, following CVA: stable on Keppra:   Acute on chronic urinary retention: Placed on Proscar and finasteride ultrasound no significant enlargement of prostate gland, monitor bladder scan PRN and outpatient follow-up with PCP or urology   Hypokalemia: Resolved. K on 5- recheck in am Recent Labs  Lab 08/14/22 0506 08/15/22 0912 08/16/22 0750 08/18/22 1453 08/19/22 0228  K 3.7 3.4* 4.1 4.1 5.0     History of SIADH and frequent episode of hypokalemia sodium is stable on salt tablet 1 g 3 times daily.. Constipation;  on senna.  Hypomagnesemia;Replaced po  DVT prophylaxis: enoxaparin (LOVENOX) injection 40 mg Start: 08/14/22 2200 Code Status:   Code Status: Full Code Family Communication: plan of care discussed with patient at bedside. Patient status is: Inpatient because of awaiting SNF placement Level of care: Med-Surg   Dispo: The patient is from: home.  Family unable to take care of him.            Anticipated disposition: SNF once bed available Objective: Vitals last 24 hrs: Vitals:   08/20/22 0424 08/20/22 0729 08/20/22 0730 08/20/22 0848  BP:   (!) 141/66   Pulse: 79 69 70 79  Resp: 15 15 15  (!) 24  Temp:   98.6 F (37 C)   TempSrc:   Oral   SpO2: 99% 100% 100% 100%  Weight:      Height:       Weight change:   Physical Examination: General exam: alert awake, able to speak w/ trach in place  HEENT:Oral mucosa moist,  Ear/Nose WNL grossly Respiratory system: bilaterally clear  BS, trach collar + no use of accessory muscle Cardiovascular system: S1 & S2 +, No JVD. Gastrointestinal system: Abdomen soft,NT,ND, BS+ Nervous System:Alert, awake, moving extremities. Extremities: LE edema  neg,distal peripheral pulses palpable.  Skin: No rashes,no icterus. MSK: Normal muscle bulk,tone, power  Medications reviewed:  Scheduled Meds:  amLODipine  5 mg Oral Daily   Chlorhexidine Gluconate Cloth  6 each Topical Daily   enoxaparin (LOVENOX) injection  40 mg Subcutaneous Q24H   feeding supplement  237 mL Oral 5 X Daily   finasteride  5 mg Oral Daily   folic acid  1 mg Oral Daily   guaiFENesin  15 mL Oral BID   levETIRAcetam  1,000 mg Oral BID   magnesium oxide  200 mg Oral Daily   mupirocin ointment   Nasal BID   pantoprazole  40 mg Oral Q12H   senna  1 tablet Oral BID   sodium chloride flush  3 mL Intravenous Q12H   sodium chloride  1 g Oral TID   tamsulosin  0.4 mg Oral QPC supper   thiamine  100 mg Oral Daily   traZODone  50 mg Oral QHS  Continuous Infusions:  sodium chloride 250 mL (08/13/22 0545)     Diet Order             Diet - low sodium heart healthy           Diet full liquid Room service appropriate? Yes; Fluid consistency: Thin  Diet effective now                  Nutrition Problem: Inadequate oral intake Etiology: inability to eat, nausea, vomiting Signs/Symptoms: per patient/family report, NPO status Interventions: Boost Breeze   Intake/Output Summary (Last 24 hours) at 08/20/2022 1026 Last data filed at 08/20/2022 0429 Gross per 24 hour  Intake 720 ml  Output 2450 ml  Net -1730 ml   Net IO Since Admission: -1,724.68 mL [08/20/22 1026]  Wt Readings from Last 3 Encounters:  08/12/22 57.6 kg  07/04/20 70.7 kg  08/30/19 70.6 kg     Unresulted Labs (From admission, onward)     Start     Ordered   08/18/22 0500  Creatinine, serum  (enoxaparin (LOVENOX)    CrCl >/= 30 ml/min)  Weekly,   R     Comments: while on enoxaparin therapy    08/11/22 2016          Data Reviewed: I have personally reviewed following labs and imaging studies CBC: Recent Labs  Lab 08/14/22 0506  WBC 3.1*  HGB 10.0*  HCT 30.6*  MCV 79.9*  PLT 190    Basic Metabolic Panel: Recent Labs  Lab 08/14/22 0506 08/15/22 0912 08/16/22 0750 08/17/22 0733 08/18/22 0708 08/18/22 1453 08/19/22 0228  NA 139 138 137  --   --  136 134*  K 3.7 3.4* 4.1  --   --  4.1 5.0  CL 110 110 109  --   --  103 104  CO2 21* 22 23  --   --  27 21*  GLUCOSE 78 87 94  --   --  89 90  BUN <5* <5* 5*  --   --  6* 5*  CREATININE 0.84 0.83 0.82  --  0.89 0.84 0.80  CALCIUM 8.2* 8.0* 8.2*  --   --  8.7* 8.7*  MG  --   --  1.4* 1.8  --   --  1.6*   GFR: Estimated Creatinine Clearance: 67 mL/min (by C-G formula based on SCr of 0.8 mg/dL). Liver Function Tests: No results for input(s): "AST", "ALT", "ALKPHOS", "BILITOT", "PROT", "ALBUMIN" in the last 168 hours. No results for input(s): "LIPASE", "AMYLASE" in the last 168 hours. No results for input(s): "AMMONIA" in the last 168 hours. Coagulation Profile: No results for input(s): "INR", "PROTIME" in the last 168 hours. BNP (last 3 results) No results for input(s): "PROBNP" in the last 8760 hours. HbA1C: No results for input(s): "HGBA1C" in the last 72 hours. CBG: No results for input(s): "GLUCAP" in the last 168 hours. Lipid Profile: No results for input(s): "CHOL", "HDL", "LDLCALC", "TRIG", "CHOLHDL", "LDLDIRECT" in the last 72 hours. Thyroid Function Tests: No results for input(s): "TSH", "T4TOTAL", "FREET4", "T3FREE", "THYROIDAB" in the last 72 hours. Sepsis Labs: No results for input(s): "PROCALCITON", "LATICACIDVEN" in the last 168 hours.  Recent Results (from the past 240 hour(s))  Resp panel by RT-PCR (RSV, Flu A&B, Covid) Anterior Nasal Swab     Status: None   Collection Time: 08/11/22  3:31 PM   Specimen: Anterior Nasal Swab  Result Value Ref Range Status   SARS Coronavirus 2 by RT PCR NEGATIVE NEGATIVE Final    Comment: (NOTE) SARS-CoV-2 target nucleic acids are NOT DETECTED.  The SARS-CoV-2 RNA is generally detectable in upper respiratory specimens during the acute phase of infection.  The lowest concentration of SARS-CoV-2 viral copies this assay can detect is 138 copies/mL. A negative result does not preclude SARS-Cov-2 infection and should not be used as the sole basis for treatment or other patient management decisions. A negative result may occur with  improper specimen collection/handling, submission of specimen other than nasopharyngeal swab, presence of viral mutation(s) within the areas targeted by this assay, and inadequate number of viral copies(<138 copies/mL). A negative result must be combined with clinical observations, patient history, and epidemiological information. The expected result is Negative.  Fact Sheet for Patients:  BloggerCourse.com  Fact Sheet for Healthcare Providers:  SeriousBroker.it  This test is no t yet approved or cleared by the [place] FDA and  has been authorized for detection and/or diagnosis of SARS-CoV-2 by FDA under an Emergency Use Authorization (EUA). This EUA will remain  in effect (meaning this test can be used) for the duration of the COVID-19 declaration under Section 564(b)(1) of the Act, 21 U.S.C.section 360bbb-3(b)(1), unless the authorization is terminated  or revoked sooner.       Influenza A by PCR NEGATIVE NEGATIVE Final   Influenza B by PCR NEGATIVE NEGATIVE Final    Comment: (NOTE) The Xpert Xpress SARS-CoV-2/FLU/RSV plus assay is intended as an aid in the diagnosis of influenza from Nasopharyngeal swab specimens and should not be used as a sole basis for treatment. Nasal washings and aspirates are unacceptable for Xpert Xpress SARS-CoV-2/FLU/RSV testing.  Fact Sheet for Patients: BloggerCourse.com  Fact Sheet for Healthcare Providers: SeriousBroker.it  This test is not yet approved or cleared by the [place] FDA and has been authorized for detection and/or diagnosis of SARS-CoV-2 by FDA  under an Emergency Use Authorization (EUA). This EUA will remain in effect (meaning this test can be used) for the duration of the COVID-19 declaration under Section 564(b)(1) of the Act, 21 U.S.C. section 360bbb-3(b)(1), unless the authorization is terminated or revoked.     Resp Syncytial Virus by PCR NEGATIVE NEGATIVE Final    Comment: (NOTE) Fact Sheet for Patients: BloggerCourse.com  Fact Sheet for Healthcare Providers: SeriousBroker.it  This test is not yet approved or cleared by the [place] and has been authorized for detection and/or diagnosis of SARS-CoV-2 by FDA under an Emergency Use Authorization (EUA). This EUA will remain in effect (meaning this test can be used) for the duration of the COVID-19 declaration under Section 564(b)(1) of the Act, 21 U.S.C. section 360bbb-3(b)(1), unless the authorization is terminated or revoked.  Performed at [facility], [address], [place], [place] [address]   Surgical pcr screen     Status: Abnormal   Collection Time: 08/13/22 10:28 PM   Specimen: Nasal Mucosa; Nasal Swab  Result Value Ref Range Status   MRSA, PCR POSITIVE (A) NEGATIVE Final   Staphylococcus aureus POSITIVE (A) NEGATIVE Final    Comment: RESULT CALLED TO, READ BACK BY AND VERIFIED WITH: (NOTE) The Xpert SA Assay (FDA approved for NASAL specimens in patients 32 years of age and older), is one component of a comprehensive surveillance program. It is not intended to diagnose infection nor to guide or monitor treatment. Performed at [facility], [address], [place], [place] [address]     Antimicrobials: Anti-infectives (From admission, onward)    None      Culture/Microbiology    Component Value Date/Time   SDES BLOOD RIGHT HAND 07/05/2020 0304   SPECREQUEST  07/05/2020 0304    BOTTLES DRAWN AEROBIC AND ANAEROBIC Blood Culture adequate volume   CULT  07/05/2020 0304    NO  GROWTH 5 DAYS Performed at [facility], [address], [place], [place] [address]    REPTSTATUS 07/10/2020 FINAL 07/05/2020 0304   Radiology Studies: No results found.   LOS: 9 days   Lanae Boast, MD [facility]  08/20/2022, 10:26 AM

## 2022-08-20 NOTE — TOC Progression Note (Addendum)
Transition of Care [facility]) - Progression Note    Patient Details  Name: Evan Jacobs MRN: [id number] Date of Birth: 05-07-1951  [facility] CM/SW Contact  Janae Bridgeman, RN Phone Number: 08/20/2022, 3:31 PM  Clinical Narrative:    CM called and spoke with Alphonzo Lemmings, Admission director with [facility] and she states that [place], [place] with [facility] is expected to make bedside assessment with the patient today or tomorrow for pending bed offer.  She will follow up.  No firm bed offer at this time.  [employer] liaison was agreeable to approve auth for STR once a firm bed offer is received.  [employer] Medicare contact is May - [phone].  CM or MSW with TOC will call once firm bed offer is available.  No firm bed offers at this time - other than [facility] visit.  Okey Regal, RT with [facility] team at bedside to start trach teaching with patient.  The patient is unable to perform own trach care safely at this time so he can not return to his apartment alone.  I sent a message to OT to obtain a bedside mirror to assist with the trach teaching.  CM and MSW will continue to follow the patient for needed SNF placement.  I also left a voicemail message with Eudelia Bunch, admission at [facility] in [place] since she was considering bed offer as well.     Planned Disposition: Skilled Nursing Facility Barriers to Discharge: No SNF bed  Expected Discharge Plan and Services   Discharge Planning Services: CM Consult Post Acute Care Choice: Nursing Home Living arrangements for the past 2 months: Apartment (Patient was living with his girlfriend, [name] for past 3 years) Expected Discharge Date: 08/18/22                                     Social Determinants of Health (SDOH) Interventions SDOH Screenings   Food Insecurity: No Food Insecurity (08/12/2022)  Housing: Low Risk  (08/12/2022)  Transportation Needs: Unmet Transportation Needs (08/12/2022)  Utilities: Not At  Risk (08/12/2022)  Tobacco Use: Low Risk  (08/17/2022)    Readmission Risk Interventions    08/18/2022    2:36 PM  Readmission Risk Prevention Plan  Transportation Screening Complete  PCP or Specialist Appt within 5-7 Days Complete  Home Care Screening Complete  Medication Review (RN CM) Complete

## 2022-08-20 NOTE — Progress Notes (Signed)
  In with patient today to start teaching him how to care for his own trach.  Evan Jacobs is very pleasant and willing to do and learn trach care.  Patient will need an table top mirror so he can see where to place his left hand to stabilize the trach before taking off the HME or taking out and replacing the inner cannula. It will also be important to not have the pulse ox sensor placed on the right index finger or thumb. That will be the hand he will need to use to do trach care with.  We were able to  practice with full support and assistance changing inner  cannula and taking on and off the HME today.  He will need great deal more hands on training and education prior to discharge.

## 2022-08-21 DIAGNOSIS — R112 Nausea with vomiting, unspecified: Secondary | ICD-10-CM | POA: Diagnosis not present

## 2022-08-21 LAB — BASIC METABOLIC PANEL
Anion gap: 9 (ref 5–15)
BUN: 10 mg/dL (ref 8–23)
CO2: 25 mmol/L (ref 22–32)
Calcium: 9.2 mg/dL (ref 8.9–10.3)
Chloride: 100 mmol/L (ref 98–111)
Creatinine, Ser: 0.81 mg/dL (ref 0.61–1.24)
GFR, Estimated: >60 mL/min (ref >60)
Glucose, Bld: 103 mg/dL — ABNORMAL HIGH (ref 70–99)
Potassium: 4.2 mmol/L (ref 3.5–5.1)
Sodium: 134 mmol/L — ABNORMAL LOW (ref 135–145)

## 2022-08-21 NOTE — Progress Notes (Signed)
CSW spoke with Whitney at central intake for [facility] who states [facility]s is expected to come do an in person assessment today to determine if the facility can accept the patient.  Edwin Dada, MSW, LCSW Transitions of Care  Clinical Social Worker II [phone]

## 2022-08-21 NOTE — Progress Notes (Signed)
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 CT abdomen/pelvis showed moderate amount of retained colonic stool, no bowel obstruction. Underwent GI evaluation, endoscopy showed 2 cm hiatal hernia, benign-appearing severe cervical stenosis status post dilation. GI recommending full liquid diet, PPI twice daily and repeat endoscopy for dilation as an outpatient. PT/OT recommending SNF.Medically stable for discharge.Discharge summary and orders done.    Subjective: Patient seen and examined. Alert awake able to speak some mild cough Overnight afebrile, doing well and room air with trach in place Labs stable renal function and electrolytes   Assessment and Plan: Principal Problem:   Vomiting Active Problems:   Seizures (HCC)   Acute tracheostomy management (HCC)   Hypokalemia   Acute on chronic urinary retention   Esophageal stricture   Dysphagia   Recurrent  nausea and vomiting Esophageal stricture:  CT abdomen pelvis:No acute finding,showed moderate amount of retained colonic stool suggesting constipation.Cortisol level. 3--  ACTH negative.Endoscopy: 2 cm hiatal hernia, benign-appearing severe esophageal stenosis.  Dilated to 9 mm with appropriate mucosal wrents.  No further dilation due to finding an ulceration at the site. -GI is recommending full liquid diet, patient should not to be on solid fluid until the stricture can be dilated further. -He will need high dose PPI BID for several weeks then repeat endoscopy for dilation.Tolerting  diet  Hypovolemic hypotension: Resolved w/ fluids ACTH test: cortisol base 7.6--->16 after 60 minutes> less likely adrenal insufficiency.  Currently back on amlodipine 5 mg, on Flomax, BP stable   S/P tracheostomy management:Continue with trach care.  Followed by RT continue nebulizer flutter valve antitussives.   Seizure disorder, following CVA: No recurrence, stable on Keppra:   Acute on chronic urinary retention: Placed on Proscar and finasteride ultrasound no significant enlargement of prostate gland, monitor bladder scan PRN and outpatient follow-up with PCP or urology.   Hypokalemia: Resolved.     History of SIADH and frequent episode of hypokalemia sodium is stable on salt tablet 1 g 3 times daily.  Monitor BMP intermittently. Constipation; cont  senna.  Hypomagnesemia;Replaced po  DVT prophylaxis: enoxaparin (LOVENOX) injection 40 mg Start: 08/14/22 2200 Code Status:   Code Status: Full Code Family Communication: plan of care discussed with patient at bedside. Patient status is: Inpatient because of awaiting SNF placement Level of care: Med-Surg   Dispo: The patient is from: home.Family unable to take care of him.            Anticipated disposition: SNF once bed available Objective: Vitals last 24 hrs: Vitals:   08/20/22 1949 08/21/22 0232 08/21/22 0541 08/21/22 0733  BP: (!) 132/93  (!) 107/55 104/60  Pulse: 95 76 74 80  Resp: 17 14 17 15   Temp: 99.1 F (37.3 C)  98.2 F (36.8 C) 98.5 F (36.9 C)  TempSrc: Oral   Oral  SpO2: 100% 95% 100% 98%  Weight:      Height:       Weight change:   Physical Examination: General exam: AA0 pleasant, weak,older appearing HEENT:Oral mucosa moist, Ear/Nose WNL grossly, dentition normal. Respiratory system: bilaterally clear BS, Trach in place on room  air, no use of accessory muscle Cardiovascular system: S1 & S2 +, regular rate. Gastrointestinal system: Abdomen soft,NT,ND,BS+ Nervous System:Alert, awake, moving extremities and  grossly nonfocal Extremities: LE ankle edema , lower extremities warm Skin: No rashes,no icterus. MSK: weak muscle, low bulk,tone, power   Medications reviewed:  Scheduled Meds:  amLODipine  5 mg Oral Daily   Chlorhexidine Gluconate Cloth  6 each Topical Daily   enoxaparin (LOVENOX) injection  40 mg Subcutaneous Q24H   feeding supplement  237 mL Oral 5 X Daily   finasteride  5 mg Oral Daily   folic acid  1 mg Oral Daily   guaiFENesin  15 mL Oral BID   levETIRAcetam  1,000 mg Oral BID   magnesium oxide  200 mg Oral Daily   mupirocin ointment   Nasal BID   pantoprazole  40 mg Oral Q12H   senna  1 tablet Oral BID   sodium chloride flush  3 mL Intravenous Q12H   sodium chloride  1 g Oral TID   tamsulosin  0.4 mg Oral QPC supper   thiamine  100 mg Oral Daily   traZODone  50 mg Oral QHS  Continuous Infusions:  sodium chloride 250 mL (08/13/22 0545)     Diet Order             Diet - low sodium heart healthy           Diet full liquid Room service appropriate? Yes; Fluid consistency: Thin  Diet effective now                  Nutrition Problem: Inadequate oral intake Etiology: inability to eat, nausea, vomiting Signs/Symptoms: per patient/family report, NPO status Interventions: Boost Breeze   Intake/Output Summary (Last 24 hours) at 08/21/2022 1018 Last data filed at 08/21/2022 0852 Gross per 24 hour  Intake 480 ml  Output 4300 ml  Net -3820 ml   Net IO Since Admission: -5,544.68 mL [08/21/22 1018]  Wt Readings from Last 3 Encounters:  08/12/22 57.6 kg  07/04/20 70.7 kg  08/30/19 70.6 kg     Unresulted Labs (From admission, onward)     Start     Ordered   08/18/22 0500  Creatinine, serum  (enoxaparin (LOVENOX)    CrCl >/= 30 ml/min)  Weekly,   R     Comments: while on enoxaparin therapy    08/11/22 2016          Data Reviewed: I have personally reviewed following labs and imaging studies CBC: No results for input(s): "WBC", "NEUTROABS", "HGB", "HCT",  "MCV", "PLT" in the last 168 hours.  Basic Metabolic Panel: Recent Labs  Lab 08/15/22 0912 08/16/22 0750 08/17/22 0733 08/18/22 0708 08/18/22 1453 08/19/22 0228 08/21/22 0225  NA 138 137  --   --  136 134* 134*  K 3.4* 4.1  --   --  4.1 5.0 4.2  CL 110 109  --   --  103 104 100  CO2 22 23  --   --  27 21* 25  GLUCOSE 87 94  --   --  89 90 103*  BUN <5* 5*  --   --  6* 5* 10  CREATININE 0.83 0.82  --  0.89 0.84 0.80 0.81  CALCIUM 8.0* 8.2*  --   --  8.7* 8.7* 9.2  MG  --  1.4* 1.8  --   --  1.6*  --    GFR: Estimated Creatinine Clearance: 66.2 mL/min (by C-G formula  based on SCr of 0.81 mg/dL). Liver Function Tests: No results for input(s): "AST", "ALT", "ALKPHOS", "BILITOT", "PROT", "ALBUMIN" in the last 168 hours. No results for input(s): "LIPASE", "AMYLASE" in the last 168 hours. No results for input(s): "AMMONIA" in the last 168 hours. Coagulation Profile: No results for input(s): "INR", "PROTIME" in the last 168 hours. BNP (last 3 results) No results for input(s): "PROBNP" in the last 8760 hours. HbA1C: No results for input(s): "HGBA1C" in the last 72 hours. CBG: No results for input(s): "GLUCAP" in the last 168 hours. Lipid Profile: No results for input(s): "CHOL", "HDL", "LDLCALC", "TRIG", "CHOLHDL", "LDLDIRECT" in the last 72 hours. Thyroid Function Tests: No results for input(s): "TSH", "T4TOTAL", "FREET4", "T3FREE", "THYROIDAB" in the last 72 hours. Sepsis Labs: No results for input(s): "PROCALCITON", "LATICACIDVEN" in the last 168 hours.  Recent Results (from the past 240 hour(s))  Resp panel by RT-PCR (RSV, Flu A&B, Covid) Anterior Nasal Swab     Status: None   Collection Time: 08/11/22  3:31 PM   Specimen: Anterior Nasal Swab  Result Value Ref Range Status   SARS Coronavirus 2 by RT PCR NEGATIVE NEGATIVE Final    Comment: (NOTE) SARS-CoV-2 target nucleic acids are NOT DETECTED.  The SARS-CoV-2 RNA is generally detectable in upper respiratory specimens  during the acute phase of infection. The lowest concentration of SARS-CoV-2 viral copies this assay can detect is 138 copies/mL. A negative result does not preclude SARS-Cov-2 infection and should not be used as the sole basis for treatment or other patient management decisions. A negative result may occur with  improper specimen collection/handling, submission of specimen other than nasopharyngeal swab, presence of viral mutation(s) within the areas targeted by this assay, and inadequate number of viral copies(<138 copies/mL). A negative result must be combined with clinical observations, patient history, and epidemiological information. The expected result is Negative.  Fact Sheet for Patients:  BloggerCourse.com  Fact Sheet for Healthcare Providers:  SeriousBroker.it  This test is no t yet approved or cleared by the [place] FDA and  has been authorized for detection and/or diagnosis of SARS-CoV-2 by FDA under an Emergency Use Authorization (EUA). This EUA will remain  in effect (meaning this test can be used) for the duration of the COVID-19 declaration under Section 564(b)(1) of the Act, 21 U.S.C.section 360bbb-3(b)(1), unless the authorization is terminated  or revoked sooner.       Influenza A by PCR NEGATIVE NEGATIVE Final   Influenza B by PCR NEGATIVE NEGATIVE Final    Comment: (NOTE) The Xpert Xpress SARS-CoV-2/FLU/RSV plus assay is intended as an aid in the diagnosis of influenza from Nasopharyngeal swab specimens and should not be used as a sole basis for treatment. Nasal washings and aspirates are unacceptable for Xpert Xpress SARS-CoV-2/FLU/RSV testing.  Fact Sheet for Patients: BloggerCourse.com  Fact Sheet for Healthcare Providers: SeriousBroker.it  This test is not yet approved or cleared by the [place] FDA and has been authorized for detection  and/or diagnosis of SARS-CoV-2 by FDA under an Emergency Use Authorization (EUA). This EUA will remain in effect (meaning this test can be used) for the duration of the COVID-19 declaration under Section 564(b)(1) of the Act, 21 U.S.C. section 360bbb-3(b)(1), unless the authorization is terminated or revoked.     Resp Syncytial Virus by PCR NEGATIVE NEGATIVE Final    Comment: (NOTE) Fact Sheet for Patients: BloggerCourse.com  Fact Sheet for Healthcare Providers: SeriousBroker.it  This test is not yet approved or cleared by the [place]  FDA and has been authorized for detection and/or diagnosis of SARS-CoV-2 by FDA under an Emergency Use Authorization (EUA). This EUA will remain in effect (meaning this test can be used) for the duration of the COVID-19 declaration under Section 564(b)(1) of the Act, 21 U.S.C. section 360bbb-3(b)(1), unless the authorization is terminated or revoked.  Performed at [facility], [address], [place], [place] [address]   Surgical pcr screen     Status: Abnormal   Collection Time: 08/13/22 10:28 PM   Specimen: Nasal Mucosa; Nasal Swab  Result Value Ref Range Status   MRSA, PCR POSITIVE (A) NEGATIVE Final   Staphylococcus aureus POSITIVE (A) NEGATIVE Final    Comment: RESULT CALLED TO, READ BACK BY AND VERIFIED WITH: (NOTE) The Xpert SA Assay (FDA approved for NASAL specimens in patients 5 years of age and older), is one component of a comprehensive surveillance program. It is not intended to diagnose infection nor to guide or monitor treatment. Performed at [facility], [address] [place], [place] [address]     Antimicrobials: Anti-infectives (From admission, onward)    None      Culture/Microbiology    Component Value Date/Time   SDES BLOOD RIGHT HAND 07/05/2020 0304   SPECREQUEST  07/05/2020 0304    BOTTLES DRAWN AEROBIC AND ANAEROBIC Blood Culture adequate  volume   CULT  07/05/2020 0304    NO GROWTH 5 DAYS Performed at [facility], [address] [place], [place] [address]    REPTSTATUS 07/10/2020 FINAL 07/05/2020 0304   Radiology Studies: No results found.   LOS: 10 days   Lanae Boast, MD [facility]  08/21/2022, 10:18 AM

## 2022-08-22 DIAGNOSIS — Z430 Encounter for attention to tracheostomy: Secondary | ICD-10-CM | POA: Diagnosis not present

## 2022-08-22 NOTE — Progress Notes (Addendum)
DC order noted. Pt continues to not have a SNF bed secured. SW will provide updates as available. MD aware of barrier to dc.   Dellie Burns, MSW, LCSW [phone] (coverage)

## 2022-08-22 NOTE — Progress Notes (Signed)
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 CT abdomen/pelvis showed moderate amount of retained colonic stool, no bowel obstruction. Underwent GI evaluation, endoscopy showed 2 cm hiatal hernia, benign-appearing severe cervical stenosis status post dilation. GI recommending full liquid diet, PPI twice daily and repeat endoscopy for dilation as an outpatient. PT/OT recommending SNF.Medically stable for discharge.Discharge summary and orders done.    Subjective: Patient seen and examined. Alert awake able to speak some, no complaints, says breathing comfortably, tolerating diet   Assessment and Plan: Principal Problem:   Vomiting Active Problems:   Seizures (HCC)   Acute tracheostomy management (HCC)   Hypokalemia   Acute on chronic urinary retention   Esophageal stricture   Dysphagia   Recurrent  nausea and vomiting Esophageal stricture:  CT abdomen pelvis:No acute finding,showed moderate amount of retained colonic stool suggesting constipation.Cortisol level. 3--  ACTH negative.Endoscopy: 2 cm hiatal hernia, benign-appearing severe esophageal stenosis.  Dilated to 9 mm with appropriate mucosal wrents.  No further dilation due to finding an ulceration at the site. -GI is recommending full liquid diet, patient should not to be on solid fluid until the stricture can be dilated further. -He will need high dose PPI BID for several weeks then repeat endoscopy for dilation.Tolerting diet  Hypovolemic hypotension: Resolved w/ fluids ACTH test:  cortisol base 7.6--->16 after 60 minutes> less likely adrenal insufficiency.  Currently back on amlodipine 5 mg, on Flomax, BP stable   S/P tracheostomy management:Continue with trach care.  Followed by RT continue nebulizer flutter valve antitussives.   Seizure disorder, following CVA: No recurrence, stable on Keppra:   Acute on chronic urinary retention: Placed on Proscar and finasteride ultrasound no significant enlargement of prostate gland, monitor bladder scan PRN and outpatient follow-up with PCP or urology.   Hypokalemia: Resolved.     History of SIADH and frequent episode of hypokalemia sodium is stable on salt tablet 1 g 3 times daily.  Monitor BMP intermittently. Constipation; cont  senna.  Hypomagnesemia;Replaced po  DVT prophylaxis: enoxaparin (LOVENOX) injection 40 mg Start: 08/14/22 2200 Code Status:   Code Status: Full Code Family Communication: plan of care discussed with patient at bedside. Patient status is: Inpatient because of awaiting SNF placement. Representative from [employer] purportedly came to evaluate patient yesterday but per TOC we don't know if that happened and won't be able to follow up with them until 12/26 Level of care: Med-Surg   Dispo: The patient is from: home.Family unable to take care of him.            Anticipated disposition: SNF once bed available Objective: Vitals last 24 hrs: Vitals:   08/22/22 0107 08/22/22 0332 08/22/22 0506 08/22/22 0747  BP:   117/63 130/67  Pulse: 82 82 77 78  Resp: (!) 24 20 16 18   Temp:   98.4 F (36.9 C) 98.4 F (36.9 C)  TempSrc:    Oral  SpO2: 96% 97% 100% 100%  Weight:      Height:       Weight change:   Physical Examination: General exam: AA0 pleasant, weak,older appearing HEENT:Oral mucosa moist, Ear/Nose  WNL grossly, dentition normal. Respiratory system: bilaterally clear BS, Trach in place on room air, no use of accessory muscle Cardiovascular system: S1 & S2 +, regular rate. Gastrointestinal  system: Abdomen soft,NT,ND,BS+ Nervous System:Alert, awake, moving extremities and grossly nonfocal Extremities: LE ankle edema , lower extremities warm Skin: No rashes,no icterus. MSK: weak muscle, low bulk,tone, power   Medications reviewed:  Scheduled Meds:  amLODipine  5 mg Oral Daily   Chlorhexidine Gluconate Cloth  6 each Topical Daily   enoxaparin (LOVENOX) injection  40 mg Subcutaneous Q24H   feeding supplement  237 mL Oral 5 X Daily   finasteride  5 mg Oral Daily   folic acid  1 mg Oral Daily   guaiFENesin  15 mL Oral BID   levETIRAcetam  1,000 mg Oral BID   magnesium oxide  200 mg Oral Daily   mupirocin ointment   Nasal BID   pantoprazole  40 mg Oral Q12H   senna  1 tablet Oral BID   sodium chloride flush  3 mL Intravenous Q12H   sodium chloride  1 g Oral TID   tamsulosin  0.4 mg Oral QPC supper   thiamine  100 mg Oral Daily   traZODone  50 mg Oral QHS  Continuous Infusions:  sodium chloride 250 mL (08/13/22 0545)     Diet Order             Diet - low sodium heart healthy           Diet full liquid Room service appropriate? Yes; Fluid consistency: Thin  Diet effective now                  Nutrition Problem: Inadequate oral intake Etiology: inability to eat, nausea, vomiting Signs/Symptoms: per patient/family report, NPO status Interventions: Boost Breeze   Intake/Output Summary (Last 24 hours) at 08/22/2022 0958 Last data filed at 08/22/2022 0749 Gross per 24 hour  Intake 240 ml  Output 1550 ml  Net -1310 ml   Net IO Since Admission: -6,854.68 mL [08/22/22 0958]  Wt Readings from Last 3 Encounters:  08/12/22 57.6 kg  07/04/20 70.7 kg  08/30/19 70.6 kg     Unresulted Labs (From admission, onward)     Start     Ordered   08/18/22 0500  Creatinine, serum  (enoxaparin (LOVENOX)    CrCl >/= 30 ml/min)  Weekly,   R     Comments: while on enoxaparin therapy    08/11/22 2016          Data Reviewed: I have personally reviewed following labs  and imaging studies CBC: No results for input(s): "WBC", "NEUTROABS", "HGB", "HCT", "MCV", "PLT" in the last 168 hours.  Basic Metabolic Panel: Recent Labs  Lab 08/16/22 0750 08/17/22 0733 08/18/22 0708 08/18/22 1453 08/19/22 0228 08/21/22 0225  NA 137  --   --  136 134* 134*  K 4.1  --   --  4.1 5.0 4.2  CL 109  --   --  103 104 100  CO2 23  --   --  27 21* 25  GLUCOSE 94  --   --  89 90 103*  BUN 5*  --   --  6* 5* 10  CREATININE 0.82  --  0.89 0.84 0.80 0.81  CALCIUM 8.2*  --   --  8.7* 8.7* 9.2  MG 1.4* 1.8  --   --  1.6*  --    GFR: Estimated Creatinine Clearance: 66.2 mL/min (by C-G  formula based on SCr of 0.81 mg/dL). Liver Function Tests: No results for input(s): "AST", "ALT", "ALKPHOS", "BILITOT", "PROT", "ALBUMIN" in the last 168 hours. No results for input(s): "LIPASE", "AMYLASE" in the last 168 hours. No results for input(s): "AMMONIA" in the last 168 hours. Coagulation Profile: No results for input(s): "INR", "PROTIME" in the last 168 hours. BNP (last 3 results) No results for input(s): "PROBNP" in the last 8760 hours. HbA1C: No results for input(s): "HGBA1C" in the last 72 hours. CBG: No results for input(s): "GLUCAP" in the last 168 hours. Lipid Profile: No results for input(s): "CHOL", "HDL", "LDLCALC", "TRIG", "CHOLHDL", "LDLDIRECT" in the last 72 hours. Thyroid Function Tests: No results for input(s): "TSH", "T4TOTAL", "FREET4", "T3FREE", "THYROIDAB" in the last 72 hours. Sepsis Labs: No results for input(s): "PROCALCITON", "LATICACIDVEN" in the last 168 hours.  Recent Results (from the past 240 hour(s))  Surgical pcr screen     Status: Abnormal   Collection Time: 08/13/22 10:28 PM   Specimen: Nasal Mucosa; Nasal Swab  Result Value Ref Range Status   MRSA, PCR POSITIVE (A) NEGATIVE Final   Staphylococcus aureus POSITIVE (A) NEGATIVE Final    Comment: RESULT CALLED TO, READ BACK BY AND VERIFIED WITH: (NOTE) The Xpert SA Assay (FDA approved for  NASAL specimens in patients 6 years of age and older), is one component of a comprehensive surveillance program. It is not intended to diagnose infection nor to guide or monitor treatment. Performed at [facility], [address], [place], [place] [address]     Antimicrobials: Anti-infectives (From admission, onward)    None      Culture/Microbiology    Component Value Date/Time   SDES BLOOD RIGHT HAND 07/05/2020 0304   SPECREQUEST  07/05/2020 0304    BOTTLES DRAWN AEROBIC AND ANAEROBIC Blood Culture adequate volume   CULT  07/05/2020 0304    NO GROWTH 5 DAYS Performed at [facility], [address] [place], [place] [address]    REPTSTATUS 07/10/2020 FINAL 07/05/2020 0304   Radiology Studies: No results found.   LOS: 11 days   Silvano Bilis, MD [facility]  08/22/2022, 9:58 AM

## 2022-08-23 DIAGNOSIS — K5900 Constipation, unspecified: Secondary | ICD-10-CM | POA: Diagnosis not present

## 2022-08-23 DIAGNOSIS — E876 Hypokalemia: Secondary | ICD-10-CM

## 2022-08-23 DIAGNOSIS — R112 Nausea with vomiting, unspecified: Secondary | ICD-10-CM | POA: Diagnosis not present

## 2022-08-23 DIAGNOSIS — Z430 Encounter for attention to tracheostomy: Secondary | ICD-10-CM | POA: Diagnosis not present

## 2022-08-23 NOTE — Plan of Care (Signed)

## 2022-08-23 NOTE — Progress Notes (Signed)
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 CT abdomen/pelvis showed moderate amount of retained colonic stool, no bowel obstruction. Underwent GI evaluation, endoscopy showed 2 cm hiatal hernia, benign-appearing severe cervical stenosis status post dilation. GI recommending full liquid diet, PPI twice daily and repeat endoscopy for dilation as an outpatient. PT/OT recommending SNF.Medically stable for discharge.Discharge summary and orders done.    Subjective: Patient states that he is not feeling well today since he did not get good sleep last night but doesn't know why. He declines offer for sleep aid medications tonight.  He otherwise has no concerns or questions.    Assessment and Plan: Principal Problem:   Vomiting Active Problems:   Seizures (HCC)   Acute tracheostomy management (HCC)   Hypokalemia   Acute on chronic urinary retention   Esophageal stricture   Dysphagia   Recurrent  nausea and vomiting Esophageal stricture:  CT abdomen pelvis:No acute finding,showed moderate amount of retained colonic stool suggesting constipation.Cortisol level. 3--  ACTH negative.Endoscopy: 2 cm hiatal hernia, benign-appearing severe esophageal stenosis.  Dilated to 9 mm with appropriate mucosal wrents.  No further dilation at this time due to finding an ulceration at the site. - full liquid diet and do not advance until cleared by GI - continue high dose PPI BID for several weeks then repeat endoscopy for dilation.Tolerting diet  Hypovolemic  hypotension- Resolved w/ fluids ACTH test: cortisol base 7.6--->16 after 60 minutes> less likely adrenal insufficiency.   H/o Hypertension-  - continue home amlodipine 5 mg   S/P tracheostomy management:Continue with trach care.  Followed by RT continue nebulizer flutter valve antitussives.   Seizure disorder, following CVA: stable, No recurrence - continue home Keppra:   Acute on chronic urinary retention- ultrasound no significant enlargement of prostate gland, monitor bladder scan PRN and outpatient follow-up with PCP or urology. - continue flomax - continue finasteride   Hypokalemia  hypomagnesemia- Resolved.   - continue to monitor and replete PRN    History of SIADH and frequent episode of hypokalemia sodium is stable on salt tablet 1 g 3 times daily.  Monitor BMP intermittently.  Constipation; cont  senna.   DVT prophylaxis: enoxaparin (LOVENOX) injection 40 mg Start: 08/14/22 2200 Code Status:   Code Status: Full Code Family Communication: plan of care discussed with patient at bedside. Patient status is: Inpatient because of awaiting SNF placement. TOC following, appreciate your care Level of care: Med-Surg   Dispo: The patient is from: home.Family unable to take care of him.            Anticipated disposition: SNF once bed available Objective: Vitals last 24 hrs: Vitals:   08/22/22 2236 08/22/22 2309 08/23/22 0510 08/23/22 0724  BP: (!) 159/80  123/63 115/60  Pulse: 84 89 76 78  Resp:  16 17 18   Temp:   98.2 F (36.8 C) 98 F (36.7 C)  TempSrc:    Oral  SpO2:  93% 99% 98%  Weight:      Height:        Physical Examination: General exam: pleasant,NAD HEENT:Oral mucosa moist, Ear/Nose WNL grossly, dentition normal.  Respiratory system: bilaterally clear BS, Trach in place on room air, no use of accessory muscle Cardiovascular system: extremities well perfused, strong peripheral pulses Gastrointestinal system: Abdomen soft,NT,ND,BS+ Nervous System:Alert, awake,  moving extremities and grossly nonfocal Extremities: trace LE ankle edema  Skin: No rashes,no icterus. MSK: weak muscle, low bulk,tone, power   Medications reviewed:  Scheduled Meds:  amLODipine  5 mg Oral Daily   Chlorhexidine Gluconate Cloth  6 each Topical Daily   enoxaparin (LOVENOX) injection  40 mg Subcutaneous Q24H   feeding supplement  237 mL Oral 5 X Daily   finasteride  5 mg Oral Daily   folic acid  1 mg Oral Daily   guaiFENesin  15 mL Oral BID   levETIRAcetam  1,000 mg Oral BID   magnesium oxide  200 mg Oral Daily   mupirocin ointment   Nasal BID   pantoprazole  40 mg Oral Q12H   senna  1 tablet Oral BID   sodium chloride flush  3 mL Intravenous Q12H   sodium chloride  1 g Oral TID   tamsulosin  0.4 mg Oral QPC supper   thiamine  100 mg Oral Daily   traZODone  50 mg Oral QHS  Continuous Infusions:  sodium chloride 250 mL (08/13/22 0545)     Diet Order             Diet - low sodium heart healthy           Diet full liquid Room service appropriate? Yes; Fluid consistency: Thin  Diet effective now                  Nutrition Problem: Inadequate oral intake Etiology: inability to eat, nausea, vomiting Signs/Symptoms: per patient/family report, NPO status Interventions: Boost Breeze   Intake/Output Summary (Last 24 hours) at 08/23/2022 0733 Last data filed at 08/23/2022 1749 Gross per 24 hour  Intake 1300 ml  Output 4426 ml  Net -3126 ml    Net IO Since Admission: -9,330.68 mL [08/23/22 0733]  Wt Readings from Last 3 Encounters:  08/12/22 57.6 kg  07/04/20 70.7 kg  08/30/19 70.6 kg     Unresulted Labs (From admission, onward)     Start     Ordered   08/18/22 0500  Creatinine, serum  (enoxaparin (LOVENOX)    CrCl >/= 30 ml/min)  Weekly,   R     Comments: while on enoxaparin therapy    08/11/22 2016          Data Reviewed: I have personally reviewed following labs and imaging studies  Basic Metabolic Panel: Recent Labs  Lab  08/16/22 0750 08/17/22 0733 08/18/22 0708 08/18/22 1453 08/19/22 0228 08/21/22 0225  NA 137  --   --  136 134* 134*  K 4.1  --   --  4.1 5.0 4.2  CL 109  --   --  103 104 100  CO2 23  --   --  27 21* 25  GLUCOSE 94  --   --  89 90 103*  BUN 5*  --   --  6* 5* 10  CREATININE 0.82  --  0.89 0.84 0.80 0.81  CALCIUM 8.2*  --   --  8.7* 8.7* 9.2  MG 1.4* 1.8  --   --  1.6*  --     Radiology Studies: No results found.   LOS: 12 days   Leeroy Bock, MD [facility]  08/23/2022, 7:33 AM

## 2022-08-24 DIAGNOSIS — K5900 Constipation, unspecified: Secondary | ICD-10-CM | POA: Diagnosis not present

## 2022-08-24 DIAGNOSIS — Z430 Encounter for attention to tracheostomy: Secondary | ICD-10-CM | POA: Diagnosis not present

## 2022-08-24 DIAGNOSIS — I10 Essential (primary) hypertension: Secondary | ICD-10-CM | POA: Diagnosis not present

## 2022-08-24 DIAGNOSIS — R112 Nausea with vomiting, unspecified: Secondary | ICD-10-CM | POA: Diagnosis not present

## 2022-08-24 LAB — BASIC METABOLIC PANEL
Anion gap: 9 (ref 5–15)
BUN: 8 mg/dL (ref 8–23)
CO2: 24 mmol/L (ref 22–32)
Calcium: 8.9 mg/dL (ref 8.9–10.3)
Chloride: 102 mmol/L (ref 98–111)
Creatinine, Ser: 0.73 mg/dL (ref 0.61–1.24)
GFR, Estimated: >60 mL/min (ref >60)
Glucose, Bld: 89 mg/dL (ref 70–99)
Potassium: 4.9 mmol/L (ref 3.5–5.1)
Sodium: 135 mmol/L (ref 135–145)

## 2022-08-24 LAB — MAGNESIUM: Magnesium: 1.8 mg/dL (ref 1.7–2.4)

## 2022-08-24 NOTE — Progress Notes (Signed)
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 CT abdomen/pelvis showed moderate amount of retained colonic stool, no bowel obstruction. Underwent GI evaluation, endoscopy showed 2 cm hiatal hernia, benign-appearing severe cervical stenosis status post dilation. GI recommending full liquid diet, PPI twice daily and repeat endoscopy for dilation as an outpatient. PT/OT recommending SNF. Medically stable for discharge when SNF available. TOC engaged.   Subjective: Denies any complaints today. He states that he really likes vanilla ice cream and would like more. Denies chest pain, SOB.    Assessment and Plan: Principal Problem:   Vomiting Active Problems:   Seizures (HCC)   Acute tracheostomy management (HCC)   Hypokalemia   Acute on chronic urinary retention   Esophageal stricture   Dysphagia   Constipation   Tracheostomy care (HCC)   Recurrent  nausea and vomiting Esophageal stricture:  CT abdomen pelvis:No acute finding,showed moderate amount of retained colonic stool suggesting constipation.Cortisol level. 3--  ACTH negative.Endoscopy: 2 cm hiatal hernia, benign-appearing severe esophageal stenosis.  Dilated to 9 mm with appropriate mucosal wrents.  No further dilation at this time due to finding an ulceration at the site. - full liquid diet and do not advance until cleared by GI - continue high dose PPI BID for several weeks then repeat endoscopy for dilation.Tolerting diet  Hypovolemic hypotension- Resolved w/ fluids ACTH test: cortisol  base 7.6--->16 after 60 minutes> less likely adrenal insufficiency.   H/o Hypertension-  - continue home amlodipine 5 mg   S/P tracheostomy management:Continue with trach care.  Followed by RT continue nebulizer flutter valve antitussives.   Seizure disorder, following CVA: stable, No recurrence - continue home Keppra:   Acute on chronic urinary retention- ultrasound no significant enlargement of prostate gland, monitor bladder scan PRN and outpatient follow-up with PCP or urology. - continue flomax - continue finasteride   Hypokalemia  hypomagnesemia- Resolved.   - continue to monitor and replete PRN    History of SIADH and frequent episode of hypokalemia sodium is stable on salt tablet 1 g 3 times daily.  Monitor BMP intermittently.  Constipation; cont  senna.   DVT prophylaxis: enoxaparin (LOVENOX) injection 40 mg Start: 08/14/22 2200 Code Status:   Code Status: Full Code Family Communication: plan of care discussed with patient at bedside. Patient status is: Inpatient because of awaiting SNF placement. TOC following, appreciate your care Level of care: Med-Surg   Dispo: The patient is from: home.Family unable to take care of him.            Anticipated disposition: SNF once bed available  Objective: Vitals last 24 hrs: Vitals:   08/23/22 2324 08/24/22 0516 08/24/22 0800 08/24/22 0832  BP:  130/61 130/64   Pulse: 84 70 69 80  Resp: 18 17 18 19   Temp:  98.2 F (36.8 C) 97.7 F (36.5 C)   TempSrc:   Oral   SpO2: 97% 100% 99% 98%  Weight:      Height:       Physical Examination: General exam: pleasant,NAD HEENT:Oral mucosa moist, Ear/Nose WNL grossly, dentition normal. Respiratory system: bilaterally clear BS, Trach in place on  room air, no use of accessory muscle Cardiovascular system: extremities well perfused, strong peripheral pulses Gastrointestinal system: Abdomen soft,NT,ND,BS+ Nervous System:Alert, awake, moving extremities and grossly nonfocal Extremities:  trace LE ankle edema  Skin: No rashes,no icterus. MSK: weak muscle, low bulk,tone, power   Medications reviewed:  Scheduled Meds:  amLODipine  5 mg Oral Daily   Chlorhexidine Gluconate Cloth  6 each Topical Daily   enoxaparin (LOVENOX) injection  40 mg Subcutaneous Q24H   feeding supplement  237 mL Oral 5 X Daily   finasteride  5 mg Oral Daily   folic acid  1 mg Oral Daily   guaiFENesin  15 mL Oral BID   levETIRAcetam  1,000 mg Oral BID   magnesium oxide  200 mg Oral Daily   mupirocin ointment   Nasal BID   pantoprazole  40 mg Oral Q12H   senna  1 tablet Oral BID   sodium chloride flush  3 mL Intravenous Q12H   sodium chloride  1 g Oral TID   tamsulosin  0.4 mg Oral QPC supper   thiamine  100 mg Oral Daily   traZODone  50 mg Oral QHS     Diet Order             Diet - low sodium heart healthy           Diet full liquid Room service appropriate? Yes; Fluid consistency: Thin  Diet effective now                  Nutrition Problem: Inadequate oral intake Etiology: inability to eat, nausea, vomiting Signs/Symptoms: per patient/family report, NPO status Interventions: Boost Breeze   Intake/Output Summary (Last 24 hours) at 08/24/2022 1114 Last data filed at 08/24/2022 0809 Gross per 24 hour  Intake --  Output 3450 ml  Net -3450 ml   Net IO Since Admission: -12,780.68 mL [08/24/22 1114]  Wt Readings from Last 3 Encounters:  08/12/22 57.6 kg  07/04/20 70.7 kg  08/30/19 70.6 kg    Data Reviewed: I have personally reviewed following labs and imaging studies  Basic Metabolic Panel: Recent Labs  Lab 08/18/22 0708 08/18/22 1453 08/19/22 0228 08/21/22 0225 08/24/22 0218  NA  --  136 134* 134* 135  K  --  4.1 5.0 4.2 4.9  CL  --  103 104 100 102  CO2  --  27 21* 25 24  GLUCOSE  --  89 90 103* 89  BUN  --  6* 5* 10 8  CREATININE 0.89 0.84 0.80 0.81 0.73  CALCIUM  --  8.7* 8.7* 9.2 8.9  MG  --   --  1.6*  --  1.8   Radiology Studies: No results found.    LOS: 13 days   Leeroy Bock, MD [facility]  08/24/2022, 11:14 AM

## 2022-08-25 DIAGNOSIS — K5900 Constipation, unspecified: Secondary | ICD-10-CM | POA: Diagnosis not present

## 2022-08-25 DIAGNOSIS — K222 Esophageal obstruction: Secondary | ICD-10-CM | POA: Diagnosis not present

## 2022-08-25 DIAGNOSIS — R112 Nausea with vomiting, unspecified: Secondary | ICD-10-CM | POA: Diagnosis not present

## 2022-08-25 DIAGNOSIS — Z430 Encounter for attention to tracheostomy: Secondary | ICD-10-CM | POA: Diagnosis not present

## 2022-08-25 LAB — CREATININE, SERUM
Creatinine, Ser: 0.77 mg/dL (ref 0.61–1.24)
GFR, Estimated: >60 mL/min (ref >60)

## 2022-08-25 NOTE — Progress Notes (Signed)
CSW spoke with Whitney at central intake for [facility] who states Sean at the facility is still reviewing this patient for possible admission.  Edwin Dada, MSW, LCSW Transitions of Care  Clinical Social Worker II [phone]

## 2022-08-25 NOTE — Progress Notes (Signed)
Physical Therapy Treatment Patient Details Name: Evan Jacobs MRN: [id number] DOB: 02-Nov-1950 Today's Date: 08/25/2022   History of Present Illness 71 y.o. male who presented to the ED for vomiting on 12/12. Found to have esophageal stricture s/p esophageal dilation 12/15. 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    PT Comments    Patient progressing well towards PT goals. Session focused on strengthening and overall gait/mobility. Tolerated ambulation with min guard assist and use of RW for support. Sp02 remained >94% on RA throughout activity. Performed 5xSTS in 13.5 seconds using RW for support indicating decreased functional strength, balance deficits and fall risk (12.6 sec normative for his age). Encouraged continued walking with mobility tech while in the hospital. Will follow.   Recommendations for follow up therapy are one component of a multi-disciplinary discharge planning process, led by the attending physician.  Recommendations may be updated based on patient status, additional functional criteria and insurance authorization.  Follow Up Recommendations  Home health PT Can patient physically be transported by private vehicle: Yes   Assistance Recommended at Discharge Intermittent Supervision/Assistance  Patient can return home with the following A little help with walking and/or transfers;A little help with bathing/dressing/bathroom   Equipment Recommendations  None recommended by PT    Recommendations for Other Services       Precautions / Restrictions Precautions Precautions: Fall;Other (comment) Precaution Comments: trach Restrictions Weight Bearing Restrictions: No     Mobility  Bed Mobility Overal bed mobility: Modified Independent Bed Mobility:  Supine to Sit     Supine to sit: Modified independent (Device/Increase time), HOB elevated     General bed mobility comments: No assist needed. Use of rail.    Transfers Overall transfer level: Needs assistance Equipment used: Rolling walker (2 wheels) Transfers: Sit to/from Stand Sit to Stand: Supervision           General transfer comment: Supervision for safety. Stood from EOB x6.    Ambulation/Gait Ambulation/Gait assistance: Min guard Gait Distance (Feet): 150 Feet Assistive device: Rolling walker (2 wheels) Gait Pattern/deviations: Decreased step length - left, Decreased dorsiflexion - left, Step-through pattern Gait velocity: decreased     General Gait Details: Slow, mostly steady gait with hx of leg length discrepancy per report and hx of stroke impacting walking, Min guard for safety. Sp02 remained >94% on RA.   Stairs             Wheelchair Mobility    Modified Rankin (Stroke Patients Only)       Balance Overall balance assessment: Needs assistance Sitting-balance support: Feet supported, No upper extremity supported Sitting balance-Leahy Scale: Good     Standing balance support: During functional activity Standing balance-Leahy Scale: Fair Standing balance comment: Prefers UE support for walking.                            Cognition Arousal/Alertness: Awake/alert Behavior During Therapy: WFL for tasks assessed/performed Overall Cognitive Status: Within Functional Limits for tasks assessed                                 General Comments: Communicating well today, reports awaiting SNF bed.        Exercises Other Exercises Other Exercises: Sit to stand  x5 from EOB using RW    General Comments General comments (skin integrity, edema, etc.): Sp02 remained >94% on RA throughout activity. Performed 5xSTS in 13.5 seconds using RW for support indicating decreased functional strength, balance deficits and fall risk  (12.6 sec normative for his age).      Pertinent Vitals/Pain Pain Assessment Pain Assessment: No/denies pain    Home Living                          Prior Function            PT Goals (current goals can now be found in the care plan section) Acute Rehab PT Goals Patient Stated Goal: to get out of here PT Goal Formulation: With patient Time For Goal Achievement: 09/08/22 Potential to Achieve Goals: Good Progress towards PT goals: Progressing toward goals    Frequency    Min 2X/week      PT Plan Current plan remains appropriate    Co-evaluation              AM-PAC PT "6 Clicks" Mobility   Outcome Measure  Help needed turning from your back to your side while in a flat bed without using bedrails?: None Help needed moving from lying on your back to sitting on the side of a flat bed without using bedrails?: None Help needed moving to and from a bed to a chair (including a wheelchair)?: A Little Help needed standing up from a chair using your arms (e.g., wheelchair or bedside chair)?: A Little Help needed to walk in hospital room?: A Little Help needed climbing 3-5 steps with a railing? : A Little 6 Click Score: 20    End of Session Equipment Utilized During Treatment: Gait belt Activity Tolerance: Patient tolerated treatment well Patient left: in bed;with call bell/phone within reach (sitting EOB eating ice cream) Nurse Communication: Mobility status PT Visit Diagnosis: Other abnormalities of gait and mobility (R26.89)     Time: 7169-6789 PT Time Calculation (min) (ACUTE ONLY): 17 min  Charges:  $Therapeutic Activity: 8-22 mins                     Vale Haven, PT, DPT Acute Rehabilitation Services Secure chat preferred Office [phone]      Blake Divine A Eduard Penkala 08/25/2022, 10:10 AM

## 2022-08-25 NOTE — Progress Notes (Signed)
Inner cannulas ordered and now at bedside.  Inner cannula changed along with trach dressing

## 2022-08-25 NOTE — Progress Notes (Signed)
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 CT abdomen/pelvis showed moderate amount of retained colonic stool, no bowel obstruction. Underwent GI evaluation, endoscopy showed 2 cm hiatal hernia, benign-appearing severe cervical stenosis status post dilation. GI recommending full liquid diet, PPI twice daily and repeat endoscopy for dilation as an outpatient. PT/OT recommending SNF. Medically stable for discharge when SNF available. TOC engaged.   Subjective: Denies complaints today. He is agreeable to SNF discharge when available. He feels well overall.    Assessment and Plan: Principal Problem:   Vomiting Active Problems:   Seizures (HCC)   Acute tracheostomy management (HCC)   Hypokalemia   Acute on chronic urinary retention   Esophageal stricture   Dysphagia   Constipation   Tracheostomy care (HCC)   Primary hypertension   Recurrent  nausea and vomiting- resolved s/p esophageal dilation 12/15 Esophageal stricture:  CT abdomen pelvis: No acute finding,showed moderate amount of retained colonic stool suggesting constipation. Endoscopy: 2 cm hiatal hernia, benign-appearing severe esophageal stenosis.  Dilated to 9 mm with appropriate mucosal wrents.  No further dilation at this time due to finding an ulceration at the site. - full liquid diet and do not advance until cleared by GI - continue high dose PPI BID for several weeks then repeat endoscopy for repeat dilation.Tolerting diet well - antiemetics PRN  Hypovolemic hypotension-  Resolved w/ fluids. ACTH test: cortisol base 7.6--->16 after 60 minutes> less likely adrenal insufficiency.   H/o Hypertension-  - continue home amlodipine 5 mg   S/P tracheostomy management:Continue with trach care.  Followed by RT  - continue nebulizer flutter valve antitussives.   Seizure disorder, following CVA: stable, No recurrence - continue home Keppra:   Acute on chronic urinary retention- ultrasound no significant enlargement of prostate gland, monitor bladder scan PRN and outpatient follow-up with PCP or urology. - continue flomax - continue finasteride   Hypokalemia  hypomagnesemia- Resolved.   - continue to monitor and replete PRN    History of SIADH and frequent episode of hypokalemia sodium is stable on salt tablet 1 g 3 times daily.  Monitor BMP intermittently.  Constipation; cont  senna.   DVT prophylaxis: enoxaparin (LOVENOX) injection 40 mg Start: 08/14/22 2200 Code Status:   Code Status: Full Code Family Communication: plan of care discussed with patient at bedside. Patient status is: Inpatient because of awaiting SNF placement. TOC following, appreciate your care Level of care: Med-Surg   Dispo: The patient is from: home.Family unable to take care of him.            Anticipated disposition: SNF once bed available  Objective: Vitals last 24 hrs: Vitals:   08/24/22 0832 08/24/22 1619 08/24/22 1726 08/25/22 0533  BP:  (!) 143/83  (!) 124/57  Pulse: 80 90 88 73  Resp: 19 18 16 17   Temp:  98 F (36.7 C)  98 F (36.7 C)  TempSrc:  Oral    SpO2: 98% 99% 98% 100%  Weight:      Height:       Physical Examination: General exam: pleasant,NAD HEENT:Oral mucosa moist, Ear/Nose WNL grossly, dentition normal.  Respiratory system: bilaterally clear BS, Trach in place on room air, no use of accessory muscle Cardiovascular system: extremities well perfused, strong peripheral pulses Gastrointestinal system: Abdomen soft,NT,ND,BS+ Nervous System:Alert, awake,  moving extremities and grossly nonfocal Extremities: trace LE ankle edema  Skin: No rashes,no icterus. MSK: weak muscle, low bulk,tone, power   Medications reviewed:  Scheduled Meds:  amLODipine  5 mg Oral Daily   Chlorhexidine Gluconate Cloth  6 each Topical Daily   enoxaparin (LOVENOX) injection  40 mg Subcutaneous Q24H   feeding supplement  237 mL Oral 5 X Daily   finasteride  5 mg Oral Daily   folic acid  1 mg Oral Daily   guaiFENesin  15 mL Oral BID   levETIRAcetam  1,000 mg Oral BID   magnesium oxide  200 mg Oral Daily   mupirocin ointment   Nasal BID   pantoprazole  40 mg Oral Q12H   senna  1 tablet Oral BID   sodium chloride flush  3 mL Intravenous Q12H   sodium chloride  1 g Oral TID   tamsulosin  0.4 mg Oral QPC supper   thiamine  100 mg Oral Daily   traZODone  50 mg Oral QHS     Diet Order             Diet - low sodium heart healthy           Diet full liquid Room service appropriate? Yes; Fluid consistency: Thin  Diet effective now                  Nutrition Problem: Inadequate oral intake Etiology: inability to eat, nausea, vomiting Signs/Symptoms: per patient/family report, NPO status Interventions: Boost Breeze   Intake/Output Summary (Last 24 hours) at 08/25/2022 0716 Last data filed at 08/25/2022 0536 Gross per 24 hour  Intake 240 ml  Output 1550 ml  Net -1310 ml    Net IO Since Admission: -13,440.68 mL [08/25/22 0716]  Wt Readings from Last 3 Encounters:  08/12/22 57.6 kg  07/04/20 70.7 kg  08/30/19 70.6 kg    Data Reviewed: I have personally reviewed following labs and imaging studies  Basic Metabolic Panel: Recent Labs  Lab 08/18/22 1453 08/19/22 0228 08/21/22 0225 08/24/22 0218  NA 136 134* 134* 135  K 4.1 5.0 4.2 4.9  CL 103 104 100 102  CO2 27 21* 25 24  GLUCOSE 89 90 103* 89  BUN 6* 5* 10 8  CREATININE 0.84 0.80 0.81 0.73  CALCIUM 8.7* 8.7* 9.2 8.9  MG  --  1.6*  --  1.8     LOS: 14 days   Leeroy Bock,  MD [facility]  08/25/2022, 7:16 AM

## 2022-08-26 DIAGNOSIS — R112 Nausea with vomiting, unspecified: Secondary | ICD-10-CM | POA: Diagnosis not present

## 2022-08-26 LAB — CBC
HCT: 26.9 % — ABNORMAL LOW (ref 39.0–52.0)
Hemoglobin: 9 g/dL — ABNORMAL LOW (ref 13.0–17.0)
MCH: 26.8 pg (ref 26.0–34.0)
MCHC: 33.5 g/dL (ref 30.0–36.0)
MCV: 80.1 fL (ref 80.0–100.0)
Platelets: 274 10*3/uL (ref 150–400)
RBC: 3.36 MIL/uL — ABNORMAL LOW (ref 4.22–5.81)
RDW: 18.4 % — ABNORMAL HIGH (ref 11.5–15.5)
WBC: 4.9 10*3/uL (ref 4.0–10.5)
nRBC: 0 % (ref 0.0–0.2)

## 2022-08-26 LAB — BASIC METABOLIC PANEL
Anion gap: 6 (ref 5–15)
BUN: 12 mg/dL (ref 8–23)
CO2: 26 mmol/L (ref 22–32)
Calcium: 8.8 mg/dL — ABNORMAL LOW (ref 8.9–10.3)
Chloride: 100 mmol/L (ref 98–111)
Creatinine, Ser: 0.76 mg/dL (ref 0.61–1.24)
GFR, Estimated: >60 mL/min (ref >60)
Glucose, Bld: 99 mg/dL (ref 70–99)
Potassium: 4.3 mmol/L (ref 3.5–5.1)
Sodium: 132 mmol/L — ABNORMAL LOW (ref 135–145)

## 2022-08-26 NOTE — Care Management Important Message (Signed)
Important Message  Patient Details  Name: Evan Jacobs MRN: [id number] Date of Birth: Feb 28, 1951   Medicare Important Message Given:  Yes     Moncerrath Berhe 08/26/2022, 3:15 PM

## 2022-08-26 NOTE — Progress Notes (Signed)
Mobility Specialist: Progress Note   08/26/22 1004  Mobility  Activity Ambulated with assistance in hallway  Level of Assistance Contact guard assist, steadying assist  Assistive Device Front wheel walker  Distance Ambulated (ft) 500 ft  Activity Response Tolerated well  Mobility Referral Yes  $Mobility charge 1 Mobility   Pre-Mobility: 89 HR, 98% SpO2 Post-Mobility: 103 HR, 98% SpO2  Pt received in the bed and agreeable to mobility. Mod I with bed mobility and contact guard during ambulation. C/o mild SOB, otherwise asymptomatic. Pt back to bed after session with call bell and phone in reach.   Saya Mccoll Mobility Specialist Please contact via SecureChat or Rehab office at [phone]

## 2022-08-26 NOTE — Progress Notes (Signed)
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  CT abdomen/pelvis showed moderate amount of retained colonic stool, no bowel obstruction.  Underwent GI evaluation, endoscopy showed 2 cm hiatal hernia, benign-appearing severe cervical stenosis status post dilation.  GI recommending full liquid diet, PPI twice daily and repeat endoscopy for dilation as an outpatient.  PT/OT recommending SNF.  Medically stable for discharge.  Discharge summary and orders done.   Assessment & Plan:  Principal Problem:   Vomiting Active Problems:   Seizures (HCC)   Acute tracheostomy management (HCC)   Hypokalemia   Acute on chronic urinary retention   Esophageal stricture   Dysphagia   Constipation   Tracheostomy care (HCC)   Primary hypertension      -Recurrent  nausea and vomiting: in setting esophageal stricture:  -CT abdomen pelvis: No CT evidence of acute. Moderate amount of retained colonic stool suggesting constipation. No evidence of bowel obstruction. -PRN zofran ordered. Change Reglan to PRN. -IV Protonix BID. He will need high dose PPI for several weeks, then repeat endoscopy for dilation.  -Cortisol level. 3--  ACTH negative -Endoscopy: 2 cm hiatal hernia, benign-appearing severe esophageal stenosis.  Dilated to 9 mm with appropriate mucosal wrents.  No further dilation due to finding an ulceration at the site. -GI is recommending full liquid diet, patient should not to be on solid fluid until the stricture can be dilated further.  -He will need  high dose PPI BID for several weeks then repeat endoscopy for dilation.  -Continue with full liquid diet.    Hypotension: Related to hypovolemia.  Antihypertensives resumed BP Improved with fluids    Acute tracheostomy management: Continue with trach care.  RT following. Nebulizer PRN. Continue Flutter valve   Seizure disorder: History of seizure following CVA. Continue with Keppra.    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  Outpatient follow-up with PCP/urology.   History of chronic hyponatremia/SIADH On salt tablets.  Monitor sodium level intermittently   Constipation: Continue bowel regimen   Hypomagnesemia: Continue supplementation    DVT prophylaxis:enoxaparin (LOVENOX) injection 40 mg Start: 08/14/22 2200     Code Status: Full Code  Family Communication: None at the bed side  Patient status: Inpatient  Patient is from : Home  Anticipated discharge to: SNF  Estimated DC date: whenever possile   Consultants: gI  Procedures:EGD  Antimicrobials:  Anti-infectives (From admission, onward)    None       Subjective: Patient seen and examined at bedside today.  Hemodynamically stable.  Lying in bed.  Alert and oriented.  On room air.  Denies any complaints today  Objective: Vitals:   08/26/22 0312 08/26/22 0421 08/26/22 0906 08/26/22 1343  BP: (!) 125/55  (!) 114/51   Pulse: 78  81 80  Resp: 18  18 18   Temp: 98.1 F (36.7 C)  98 F (36.7 C)   TempSrc: Oral  Oral   SpO2: 100% 98% 99% 100%  Weight:      Height:  Intake/Output Summary (Last 24 hours) at 08/26/2022 1526 Last data filed at 08/26/2022 0842 Gross per 24 hour  Intake 237 ml  Output 1225 ml  Net -988 ml   Filed Weights   08/11/22 2133 08/12/22 1157  Weight: 70.7 kg 57.6 kg    Examination:  General exam: Overall comfortable, not in distress HEENT:  PERRL,trach Respiratory system:  no wheezes or crackles  Cardiovascular system: S1 & S2 heard, RRR.  Gastrointestinal system: Abdomen is nondistended, soft and nontender. Central nervous system: Alert and oriented Extremities: No edema, no clubbing ,no cyanosis Skin: No rashes, no ulcers,no icterus     Data Reviewed: I have personally reviewed following labs and imaging studies  CBC: Recent Labs  Lab 08/26/22 0252  WBC 4.9  HGB 9.0*  HCT 26.9*  MCV 80.1  PLT 274   Basic Metabolic Panel: Recent Labs  Lab 08/21/22 0225 08/24/22 0218 08/25/22 0706 08/26/22 0252  NA 134* 135  --  132*  K 4.2 4.9  --  4.3  CL 100 102  --  100  CO2 25 24  --  26  GLUCOSE 103* 89  --  99  BUN 10 8  --  12  CREATININE 0.81 0.73 0.77 0.76  CALCIUM 9.2 8.9  --  8.8*  MG  --  1.8  --   --      No results found for this or any previous visit (from the past 240 hour(s)).    Radiology Studies: No results found.  Scheduled Meds:  amLODipine  5 mg Oral Daily   Chlorhexidine Gluconate Cloth  6 each Topical Daily   enoxaparin (LOVENOX) injection  40 mg Subcutaneous Q24H   feeding supplement  237 mL Oral 5 X Daily   finasteride  5 mg Oral Daily   folic acid  1 mg Oral Daily   guaiFENesin  15 mL Oral BID   levETIRAcetam  1,000 mg Oral BID   magnesium oxide  200 mg Oral Daily   mupirocin ointment   Nasal BID   pantoprazole  40 mg Oral Q12H   senna  1 tablet Oral BID   sodium chloride flush  3 mL Intravenous Q12H   sodium chloride  1 g Oral TID   tamsulosin  0.4 mg Oral QPC supper   thiamine  100 mg Oral Daily   traZODone  50 mg Oral QHS   Continuous Infusions:  sodium chloride 250 mL (08/13/22 0545)     LOS: 15 days   Burnadette Pop, MD [facility] P12/27/2023, 3:26 PM

## 2022-08-27 DIAGNOSIS — R112 Nausea with vomiting, unspecified: Secondary | ICD-10-CM | POA: Diagnosis not present

## 2022-08-27 NOTE — TOC Progression Note (Addendum)
Transition of Care [facility]) - Progression Note    Patient Details  Name: Evan Jacobs MRN: [id number] Date of Birth: Mar 28, 1951  Transition of Care [facility]) CM/SW Contact  Janae Bridgeman, RN Phone Number: 08/27/2022, 11:04 AM  Clinical Narrative:    CM called and left a voicemail message with Gregary Signs, regional admissions director with [facility] to see if the facility is able to make a bed offer to the patient for admission.  No confirmed bed offers at this time.  I also called and left a message with Cheyenne Adas to check on bed availability at the facility.  08/27/2022 1110 - I called and spoke with [facility] admissions and she will review the patient today.  No bed offers at this time.  08/27/22 1123 - CM called and spoke with Gregary Signs, CM at [facility] and he is offering an admission bed to the patient but is going to determine which facility can accept him - [facility] in [place] pending at this time.  08/27/2022 - [facility] made a firm bed offer for admission for the patient.  I started insurance authorization through [facility].  I called RNCM with [facility] and updated her so that she could assist with the expedited approval for SNF placement.  The patient is aware of bed offer to the facility with pending insurance authorization.   Expected Discharge Plan: Long Term Nursing Home Barriers to Discharge: No SNF bed  Expected Discharge Plan and Services   Discharge Planning Services: CM Consult Post Acute Care Choice: Nursing Home Living arrangements for the past 2 months: Apartment (Patient was living with his girlfriend, [name] for past 3 years) Expected Discharge Date: 08/18/22                                     Social Determinants of Health (SDOH) Interventions SDOH Screenings   Food Insecurity: No Food Insecurity (08/12/2022)  Housing: Low Risk  (08/12/2022)  Transportation Needs: Unmet Transportation Needs (08/12/2022)  Utilities: Not  At Risk (08/12/2022)  Tobacco Use: Low Risk  (08/17/2022)    Readmission Risk Interventions    08/18/2022    2:36 PM  Readmission Risk Prevention Plan  Transportation Screening Complete  PCP or Specialist Appt within 5-7 Days Complete  Home Care Screening Complete  Medication Review (RN CM) Complete

## 2022-08-27 NOTE — Progress Notes (Signed)
Nutrition Follow-up  DOCUMENTATION CODES:   Not applicable  INTERVENTION:  - Continue Ensure Enlive po to 5x daily, each supplement provides 350 kcal and 20 grams of protein.   - Continue Magic cup TID with meals, each supplement provides 290 kcal and 9 grams of protein.   NUTRITION DIAGNOSIS:   Inadequate oral intake related to inability to eat, nausea, vomiting as evidenced by per patient/family report, NPO status.  GOAL:   Patient will meet greater than or equal to 90% of their needs  MONITOR:   PO intake, Supplement acceptance, Diet advancement  REASON FOR ASSESSMENT:   Malnutrition Screening Tool    ASSESSMENT:   71 y.o. male admits related to vomiting. PMH includes: acid reflux, CVA, gout, HTN, laryngeal stenosis, seizures, urinary retention. Pt is currently receiving medical management for recurrent nausea/vomiting.  Meds reviewed: folic acid, Mag-ox, senokot, thiamine. Labs reviewed: Na low.   Pt continues on full liquid diet in setting of esophageal stricture. Per record, pt continues to eat 100% of his meals. Pt is pending discharge to SNF. RD will continue to monitor throughout stay.   Diet Order:   Diet Order             Diet - low sodium heart healthy           Diet full liquid Room service appropriate? Yes; Fluid consistency: Thin  Diet effective now                   EDUCATION NEEDS:   Not appropriate for education at this time  Skin:  Skin Assessment: Reviewed RN Assessment  Last BM:  08/25/22  Height:   Ht Readings from Last 1 Encounters:  08/12/22 5\' 6"  (1.676 m)    Weight:   Wt Readings from Last 1 Encounters:  08/12/22 57.6 kg    Ideal Body Weight:     BMI:  Body mass index is 20.5 kg/m.  Estimated Nutritional Needs:   Kcal:  1440-1725 kcals  Protein:  70-85 gm  Fluid:  >/= 1.4 L  08/14/22, RD, LDN, CNSC.

## 2022-08-27 NOTE — Progress Notes (Signed)
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  CT abdomen/pelvis showed moderate amount of retained colonic stool, no bowel obstruction.  Underwent GI evaluation, endoscopy showed 2 cm hiatal hernia, benign-appearing severe cervical stenosis status post dilation.  GI recommending full liquid diet, PPI twice daily and repeat endoscopy for dilation as an outpatient.  PT/OT recommending SNF.  Medically stable for discharge.  Discharge summary and orders are in   Assessment & Plan:  Principal Problem:   Vomiting Active Problems:   Seizures ([place])   Acute tracheostomy management ([place])   Hypokalemia   Acute on chronic urinary retention   Esophageal stricture   Dysphagia   Constipation   Tracheostomy care ([place])   Primary hypertension      -Recurrent  nausea and vomiting: in setting esophageal stricture:  -CT abdomen pelvis: No CT evidence of acute. Moderate amount of retained colonic stool suggesting constipation. No evidence of bowel obstruction. -PRN zofran ordered. Change Reglan to PRN. -IV Protonix BID. He will need high dose PPI for several weeks, then repeat endoscopy for dilation.  -Endoscopy: 2 cm hiatal hernia, benign-appearing severe esophageal stenosis.  Dilated to 9 mm with appropriate mucosal wrents.  No further dilation due to finding an ulceration at the site. -GI is recommending full liquid diet, patient should not to be on solid fluid until the stricture can be dilated further.  -He will need high dose PPI BID for several weeks  then repeat endoscopy for dilation.  -Continue with full liquid diet.    Hypotension: Related to hypovolemia.  Antihypertensives resumed BP Improved with fluids    Acute tracheostomy management: Continue with trach care.  RT following. Nebulizer PRN. Continue Flutter valve   Seizure disorder: History of seizure following CVA. Continue with Keppra.    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  Outpatient follow-up with PCP/urology.   History of chronic hyponatremia/SIADH On salt tablets.  Monitor sodium level intermittently   Constipation: Continue bowel regimen   Hypomagnesemia: Continue supplementation    DVT prophylaxis:enoxaparin (LOVENOX) injection 40 mg Start: 08/14/22 2200     Code Status: Full Code  Family Communication: None at the bed side  Patient status: Inpatient  Patient is from : Home  Anticipated discharge to: SNF  Estimated DC date: whenever possile   Consultants: gI  Procedures:EGD  Antimicrobials:  Anti-infectives (From admission, onward)    None       Subjective: Patient seen and examined at bedside today.  Hemodynamically stable.  No new complaints.  On room air.  Objective: Vitals:   08/27/22 0447 08/27/22 0718 08/27/22 0745 08/27/22 1220  BP: (!) 118/52  (!) 125/51   Pulse: 73 75 80 87  Resp: 17 18  18   Temp: 98.2 F (36.8 C)  98.2 F (36.8 C)   TempSrc:   Oral   SpO2: 100% 97% 100% 97%  Weight:      Height:        Intake/Output Summary (Last 24 hours) at 08/27/2022 1241 Last data filed at  08/27/2022 1034 Gross per 24 hour  Intake 600 ml  Output 2100 ml  Net -1500 ml   Filed Weights   08/11/22 2133 08/12/22 1157  Weight: 70.7 kg 57.6 kg    Examination:  General exam: Overall comfortable, not in distress HEENT: PERRL,trach Respiratory system:  no wheezes or crackles  Cardiovascular system: S1 & S2 heard,  RRR.  Gastrointestinal system: Abdomen is nondistended, soft and nontender. Central nervous system: Alert and oriented Extremities: No edema, no clubbing ,no cyanosis Skin: No rashes, no ulcers,no icterus     Data Reviewed: I have personally reviewed following labs and imaging studies  CBC: Recent Labs  Lab 08/26/22 0252  WBC 4.9  HGB 9.0*  HCT 26.9*  MCV 80.1  PLT 274   Basic Metabolic Panel: Recent Labs  Lab 08/21/22 0225 08/24/22 0218 08/25/22 0706 08/26/22 0252  NA 134* 135  --  132*  K 4.2 4.9  --  4.3  CL 100 102  --  100  CO2 25 24  --  26  GLUCOSE 103* 89  --  99  BUN 10 8  --  12  CREATININE 0.81 0.73 0.77 0.76  CALCIUM 9.2 8.9  --  8.8*  MG  --  1.8  --   --      No results found for this or any previous visit (from the past 240 hour(s)).    Radiology Studies: No results found.  Scheduled Meds:  amLODipine  5 mg Oral Daily   Chlorhexidine Gluconate Cloth  6 each Topical Daily   enoxaparin (LOVENOX) injection  40 mg Subcutaneous Q24H   feeding supplement  237 mL Oral 5 X Daily   finasteride  5 mg Oral Daily   folic acid  1 mg Oral Daily   guaiFENesin  15 mL Oral BID   levETIRAcetam  1,000 mg Oral BID   magnesium oxide  200 mg Oral Daily   mupirocin ointment   Nasal BID   pantoprazole  40 mg Oral Q12H   senna  1 tablet Oral BID   sodium chloride flush  3 mL Intravenous Q12H   sodium chloride  1 g Oral TID   tamsulosin  0.4 mg Oral QPC supper   thiamine  100 mg Oral Daily   traZODone  50 mg Oral QHS   Continuous Infusions:  sodium chloride 250 mL (08/13/22 0545)     LOS: 16 days   Burnadette Pop, MD [facility] P12/28/2023, 12:41 PM

## 2022-08-28 DIAGNOSIS — R112 Nausea with vomiting, unspecified: Secondary | ICD-10-CM | POA: Diagnosis not present

## 2022-08-28 NOTE — Plan of Care (Signed)
  Problem: Education: Goal: Knowledge of General Education information will improve Description Including pain rating scale, medication(s)/side effects and non-pharmacologic comfort measures Outcome: Progressing   Problem: Health Behavior/Discharge Planning: Goal: Ability to manage health-related needs will improve Outcome: Progressing   Problem: Clinical Measurements: Goal: Respiratory complications will improve Outcome: Progressing   Problem: Activity: Goal: Risk for activity intolerance will decrease Outcome: Progressing   Problem: Nutrition: Goal: Adequate nutrition will be maintained Outcome: Progressing   Problem: Safety: Goal: Ability to remain free from injury will improve Outcome: Progressing   

## 2022-08-28 NOTE — Progress Notes (Signed)
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  CT abdomen/pelvis showed moderate amount of retained colonic stool, no bowel obstruction.  Underwent GI evaluation, endoscopy showed 2 cm hiatal hernia, benign-appearing severe cervical stenosis status post dilation.  GI recommending full liquid diet, PPI twice daily and repeat endoscopy for dilation as an outpatient.  PT/OT recommending SNF.  Medically stable for discharge.  Discharge summary and orders are in   Assessment & Plan:  Principal Problem:   Vomiting Active Problems:   Seizures (HCC)   Acute tracheostomy management (HCC)   Hypokalemia   Acute on chronic urinary retention   Esophageal stricture   Dysphagia   Constipation   Tracheostomy care (HCC)   Primary hypertension      -Recurrent  nausea and vomiting: in setting esophageal stricture:  -CT abdomen pelvis: No CT evidence of acute. Moderate amount of retained colonic stool suggesting constipation. No evidence of bowel obstruction. -PRN zofran ordered. Change Reglan to PRN. -IV Protonix BID. He will need high dose PPI for several weeks, then repeat endoscopy for dilation.  -Endoscopy: 2 cm hiatal hernia, benign-appearing severe esophageal stenosis.  Dilated to 9 mm with appropriate mucosal wrents.  No further dilation due to finding an ulceration at the site. -GI is recommending full liquid diet, patient should not to be on solid fluid until the stricture can be dilated further.  -He will need high dose PPI BID for several weeks  then repeat endoscopy for dilation.  -Continue with full liquid diet.    Hypotension: Related to hypovolemia.  Antihypertensives resumed BP Improved with fluids    Acute tracheostomy management: Continue with trach care.  RT following. Nebulizer PRN. Continue Flutter valve   Seizure disorder: History of seizure following CVA. Continue with Keppra.    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  Outpatient follow-up with PCP/urology.   History of chronic hyponatremia/SIADH On salt tablets.  Monitor sodium level intermittently   Constipation: Continue bowel regimen   Hypomagnesemia: Continue supplementation    DVT prophylaxis:enoxaparin (LOVENOX) injection 40 mg Start: 08/14/22 2200     Code Status: Full Code  Family Communication: None at the bed side  Patient status: Inpatient  Patient is from : Home  Anticipated discharge to: SNF  Estimated DC date: whenever possile   Consultants: gI  Procedures:EGD  Antimicrobials:  Anti-infectives (From admission, onward)    None       Subjective: Patient seen and examined at bedside today.  Hemodynamically stable.  Comfortable, without any complaints.  Waiting for bed at SNF  Objective: Vitals:   08/28/22 0459 08/28/22 0500 08/28/22 0732 08/28/22 0806  BP: (!) 120/55   128/71  Pulse: 80 80 70 83  Resp: 17 17 16 16   Temp: 98.2 F (36.8 C)   98.4 F (36.9 C)  TempSrc:    Oral  SpO2: 100% 100% 96% 100%  Weight:      Height:        Intake/Output Summary (Last 24 hours) at 08/28/2022 1110 Last data  filed at 08/28/2022 0500 Gross per 24 hour  Intake 840 ml  Output 1500 ml  Net -660 ml   Filed Weights   08/11/22 2133 08/12/22 1157  Weight: 70.7 kg 57.6 kg    Examination:  General exam: Overall comfortable, not in distress HEENT: PERRL Respiratory system:  no wheezes or crackles  ,trach Cardiovascular system: S1 & S2 heard, RRR.  Gastrointestinal system: Abdomen is nondistended, soft and nontender. Central nervous system: Alert and oriented Extremities: No edema, no clubbing ,no cyanosis Skin: No rashes, no ulcers,no icterus     Data Reviewed: I have personally reviewed following labs and imaging studies  CBC: Recent Labs  Lab 08/26/22 0252  WBC 4.9  HGB 9.0*  HCT 26.9*  MCV 80.1  PLT 123456   Basic Metabolic Panel: Recent Labs  Lab 08/24/22 0218 08/25/22 0706 08/26/22 0252  NA 135  --  132*  K 4.9  --  4.3  CL 102  --  100  CO2 24  --  26  GLUCOSE 89  --  99  BUN 8  --  12  CREATININE 0.73 0.77 0.76  CALCIUM 8.9  --  8.8*  MG 1.8  --   --      No results found for this or any previous visit (from the past 240 hour(s)).    Radiology Studies: No results found.  Scheduled Meds:  amLODipine  5 mg Oral Daily   Chlorhexidine Gluconate Cloth  6 each Topical Daily   enoxaparin (LOVENOX) injection  40 mg Subcutaneous Q24H   feeding supplement  237 mL Oral 5 X Daily   finasteride  5 mg Oral Daily   folic acid  1 mg Oral Daily   guaiFENesin  15 mL Oral BID   levETIRAcetam  1,000 mg Oral BID   magnesium oxide  200 mg Oral Daily   mupirocin ointment   Nasal BID   pantoprazole  40 mg Oral Q12H   senna  1 tablet Oral BID   sodium chloride flush  3 mL Intravenous Q12H   sodium chloride  1 g Oral TID   tamsulosin  0.4 mg Oral QPC supper   thiamine  100 mg Oral Daily   traZODone  50 mg Oral QHS   Continuous Infusions:  sodium chloride 250 mL (08/13/22 0545)     LOS: 17 days   Shelly Coss, MD [facility] P12/29/2023, 11:10 AM

## 2022-08-28 NOTE — Progress Notes (Signed)
Physical Therapy Treatment/ Discharge Patient Details Name: Evan Jacobs MRN: [id number] DOB: 11/13/1950 Today's Date: 08/28/2022   History of Present Illness 71 y.o. male who presented to the ED for vomiting on 12/12. Found to have esophageal stricture s/p esophageal dilation 12/15. 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    PT Comments    Pt moving well with use of RW and reports back to baseline physical function. Pt has been using rW for some time and is performing transfers and gait without physical assist. Pt has met acute therapy goals without further acute needs and will defer mobility to nursing and mobility specialists. Pt reports he would still like HHPT with return home but anticipate after SNF medical stay it may not be needed. Will sign off.     Recommendations for follow up therapy are one component of a multi-disciplinary discharge planning process, led by the attending physician.  Recommendations may be updated based on patient status, additional functional criteria and insurance authorization.  Follow Up Recommendations  Home health PT Can patient physically be transported by private vehicle: Yes   Assistance Recommended at Discharge Intermittent Supervision/Assistance  Patient can return home with the following A little help with walking and/or transfers;A little help with bathing/dressing/bathroom   Equipment Recommendations  None recommended by PT    Recommendations for Other Services       Precautions / Restrictions Precautions Precautions: Fall;Other (comment) Precaution Comments: trach     Mobility  Bed Mobility Overal bed mobility: Modified Independent                  Transfers Overall transfer level: Modified independent                       Ambulation/Gait Ambulation/Gait assistance: Modified independent (Device/Increase time) Gait Distance (Feet): 400 Feet Assistive device: Rolling walker (2 wheels) Gait Pattern/deviations: Step-through pattern, Decreased stance time - left, Decreased dorsiflexion - left   Gait velocity interpretation: >2.62 ft/sec, indicative of community ambulatory   General Gait Details: pt with steady gait with leg length discrepancy and denied wanting to wear shoes also with sligh hemiparetic gait at baseline. SPO2 >95% on RA   Stairs             Wheelchair Mobility    Modified Rankin (Stroke Patients Only)       Balance Overall balance assessment: Mild deficits observed, not formally tested   Sitting balance-Leahy Scale: Good     Standing balance support: Bilateral upper extremity supported, Reliant on assistive device for balance Standing balance-Leahy Scale: Poor Standing balance comment: RW for gait                            Cognition Arousal/Alertness: Awake/alert Behavior During Therapy: WFL for tasks assessed/performed Overall Cognitive Status: Within Functional Limits for tasks assessed                                          Exercises      General Comments        Pertinent Vitals/Pain Pain Assessment Pain Assessment: No/denies pain    Home Living  Prior Function            PT Goals (current goals can now be found in the care plan section) Progress towards PT goals: Goals met and updated - see care plan    Frequency    Min 1X/week      PT Plan Current plan remains appropriate    Co-evaluation              AM-PAC PT "6 Clicks" Mobility   Outcome Measure  Help needed turning from your back to your side while in a flat bed without using bedrails?: None Help needed moving from lying on your back to sitting on the side of a flat bed without using bedrails?:  None Help needed moving to and from a bed to a chair (including a wheelchair)?: None Help needed standing up from a chair using your arms (e.g., wheelchair or bedside chair)?: None Help needed to walk in hospital room?: A Little Help needed climbing 3-5 steps with a railing? : A Little 6 Click Score: 22    End of Session   Activity Tolerance: Patient tolerated treatment well Patient left: in bed;with call bell/phone within reach Nurse Communication: Mobility status PT Visit Diagnosis: Other abnormalities of gait and mobility (R26.89)     Time: 0998-3382 PT Time Calculation (min) (ACUTE ONLY): 12 min  Charges:  $Gait Training: 8-22 mins                     Bayard Males, PT Acute Rehabilitation Services Office: [place] 08/28/2022, 10:49 AM

## 2022-08-29 NOTE — Progress Notes (Signed)
Per [employer] this morning, auth remains pending. SW will continue to check auth status and provide updates as available.   Dellie Burns, MSW, LCSW [phone] (coverage)

## 2022-08-29 NOTE — Progress Notes (Signed)
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  CT abdomen/pelvis showed moderate amount of retained colonic stool, no bowel obstruction.  Underwent GI evaluation, endoscopy showed 2 cm hiatal hernia, benign-appearing severe cervical stenosis status post dilation.  GI recommending full liquid diet, PPI twice daily and repeat endoscopy for dilation as an outpatient.  PT/OT recommending SNF.  Medically stable for discharge.  Discharge summary and orders are in   Assessment & Plan:  Principal Problem:   Vomiting Active Problems:   Seizures (HCC)   Acute tracheostomy management (HCC)   Hypokalemia   Acute on chronic urinary retention   Esophageal stricture   Dysphagia   Constipation   Tracheostomy care (HCC)   Primary hypertension      -Recurrent  nausea and vomiting: in setting esophageal stricture:  -CT abdomen pelvis: No CT evidence of acute. Moderate amount of retained colonic stool suggesting constipation. No evidence of bowel obstruction. -PRN zofran ordered. Change Reglan to PRN. -IV Protonix BID. He will need high dose PPI for several weeks, then repeat endoscopy for dilation.  -Endoscopy: 2 cm hiatal hernia, benign-appearing severe esophageal stenosis.  Dilated to 9 mm with appropriate mucosal wrents.  No further dilation due to finding an ulceration at the site. -GI is recommending full liquid diet, patient should not to be on solid fluid until the stricture can be dilated further.  -He will need high dose PPI BID for several weeks  then repeat endoscopy for dilation.  -Continue with full liquid diet.    Hypotension: Related to hypovolemia.  Antihypertensives resumed BP Improved with fluids    Acute tracheostomy management: Continue with trach care.  RT following. Nebulizer PRN. Continue Flutter valve   Seizure disorder: History of seizure following CVA. Continue with Keppra.    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  Outpatient follow-up with PCP/urology.   History of chronic hyponatremia/SIADH On salt tablets.  Monitor sodium level intermittently   Constipation: Continue bowel regimen   Hypomagnesemia: Continue supplementation    DVT prophylaxis:enoxaparin (LOVENOX) injection 40 mg Start: 08/14/22 2200     Code Status: Full Code  Family Communication: None at the bed side  Patient status: Inpatient  Patient is from : Home  Anticipated discharge to: SNF  Estimated DC date: whenever possile   Consultants: gI  Procedures:EGD  Antimicrobials:  Anti-infectives (From admission, onward)    None       Subjective: Patient seen and examined at bedside today. Hemodynamically  stable.  Comfortably sitting on the bed.  No new complaints.  On room air  Objective: Vitals:   08/29/22 0145 08/29/22 0422 08/29/22 0747 08/29/22 0806  BP:  (!) 125/58  129/70  Pulse: 94 76 84 84  Resp: 18 18 18 16   Temp:  98.1 F (36.7 C)  97.9 F (36.6 C)  TempSrc:    Oral  SpO2:  100% 97% 99%  Weight:      Height:        Intake/Output Summary (Last 24 hours) at 08/29/2022  [place] filed at 08/29/2022 0153 Gross per 24 hour  Intake 480 ml  Output 1550 ml  Net -1070 ml   Filed Weights   08/11/22 2133 08/12/22 1157  Weight: 70.7 kg 57.6 kg    Examination:  General exam: Overall comfortable, not in distress HEENT: PERRL,trach Respiratory system:  no wheezes or crackles   Cardiovascular system: S1 & S2 heard, RRR.  Gastrointestinal system: Abdomen is nondistended, soft and nontender. Central nervous system: Alert and oriented Extremities: No edema, no clubbing ,no cyanosis Skin: No rashes, no ulcers,no icterus       Data Reviewed: I have personally reviewed following labs and imaging studies  CBC: Recent Labs  Lab 08/26/22 0252  WBC 4.9  HGB 9.0*  HCT 26.9*  MCV 80.1  PLT 123456   Basic Metabolic Panel: Recent Labs  Lab 08/24/22 0218 08/25/22 0706 08/26/22 0252  NA 135  --  132*  K 4.9  --  4.3  CL 102  --  100  CO2 24  --  26  GLUCOSE 89  --  99  BUN 8  --  12  CREATININE 0.73 0.77 0.76  CALCIUM 8.9  --  8.8*  MG 1.8  --   --      No results found for this or any previous visit (from the past 240 hour(s)).    Radiology Studies: No results found.  Scheduled Meds:  amLODipine  5 mg Oral Daily   Chlorhexidine Gluconate Cloth  6 each Topical Daily   enoxaparin (LOVENOX) injection  40 mg Subcutaneous Q24H   feeding supplement  237 mL Oral 5 X Daily   finasteride  5 mg Oral Daily   folic acid  1 mg Oral Daily   guaiFENesin  15 mL Oral BID   levETIRAcetam  1,000 mg Oral BID   magnesium oxide  200 mg Oral Daily   mupirocin ointment   Nasal BID   pantoprazole  40 mg Oral Q12H   senna  1 tablet Oral BID   sodium chloride flush  3 mL Intravenous Q12H   sodium chloride  1 g Oral TID   tamsulosin  0.4 mg Oral QPC supper   thiamine  100 mg Oral Daily   traZODone  50 mg Oral QHS   Continuous Infusions:  sodium chloride 250 mL (08/13/22 0545)     LOS: 18 days   Shelly Coss, MD [facility] P12/30/2023, 10:45 AM

## 2022-08-30 DIAGNOSIS — R112 Nausea with vomiting, unspecified: Secondary | ICD-10-CM | POA: Diagnosis not present

## 2022-08-30 MED ORDER — ONDANSETRON 4 MG PO TBDP
4.0000 mg | ORAL_TABLET | ORAL | Status: AC
  Administered 2022-08-31: 4 mg via ORAL
  Filled 2022-08-30: qty 1

## 2022-08-30 NOTE — Progress Notes (Signed)
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  CT abdomen/pelvis showed moderate amount of retained colonic stool, no bowel obstruction.  Underwent GI evaluation, endoscopy showed 2 cm hiatal hernia, benign-appearing severe cervical stenosis status post dilation.  GI recommending full liquid diet, PPI twice daily and repeat endoscopy for dilation as an outpatient.  PT/OT recommending SNF.  Medically stable for discharge.  Discharge summary and orders are in   Assessment & Plan:  Principal Problem:   Vomiting Active Problems:   Seizures (HCC)   Acute tracheostomy management (HCC)   Hypokalemia   Acute on chronic urinary retention   Esophageal stricture   Dysphagia   Constipation   Tracheostomy care (HCC)   Primary hypertension      -Recurrent  nausea and vomiting: in setting esophageal stricture:  -CT abdomen pelvis: No CT evidence of acute. Moderate amount of retained colonic stool suggesting constipation. No evidence of bowel obstruction. -PRN zofran ordered. Change Reglan to PRN. -IV Protonix BID. He will need high dose PPI for several weeks, then repeat endoscopy for dilation.  -Endoscopy: 2 cm hiatal hernia, benign-appearing severe esophageal stenosis.  Dilated to 9 mm with appropriate mucosal wrents.  No further dilation due to finding an ulceration at the site. -GI is recommending full liquid diet, patient should not to be on solid fluid until the stricture can be dilated further.  -He will need high dose PPI BID for several weeks  then repeat endoscopy for dilation.  -Continue with full liquid diet.    Hypotension: Related to hypovolemia.  Antihypertensives resumed BP Improved with fluids    Acute tracheostomy management: Continue with trach care.  RT following. Nebulizer PRN. Continue Flutter valve   Seizure disorder: History of seizure following CVA. Continue with Keppra.    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  Outpatient follow-up with PCP/urology.   History of chronic hyponatremia/SIADH On salt tablets.  Monitor sodium level intermittently   Constipation: Continue bowel regimen   Hypomagnesemia: Continue supplementation    DVT prophylaxis:enoxaparin (LOVENOX) injection 40 mg Start: 08/14/22 2200     Code Status: Full Code  Family Communication: None at the bed side  Patient status: Inpatient  Patient is from : Home  Anticipated discharge to: SNF  Estimated DC date: whenever possile   Consultants: gI  Procedures:EGD  Antimicrobials:  Anti-infectives (From admission, onward)    None       Subjective:  Patient seen and examined at bedside today.  Hemodynamically stable without any complaints today.   Objective: Vitals:   08/30/22 0429 08/30/22 0747 08/30/22 0752 08/30/22 1058  BP: (!) 128/59 128/74    Pulse: 74 77 78   Resp: 18 17 16    Temp: 98.6 F (37 C) 98.5 F (36.9 C)    TempSrc: Oral Oral    SpO2: 99% 100% 99% 100%  Weight:      Height:        Intake/Output Summary (Last 24 hours) at 08/30/2022 1154 Last data filed at 08/30/2022 1029 Gross  per 24 hour  Intake 340 ml  Output 3450 ml  Net -3110 ml   Filed Weights   08/11/22 2133 08/12/22 1157  Weight: 70.7 kg 57.6 kg    Examination:  General exam: Overall comfortable, not in distress HEENT: PERRL,trach Respiratory system:  no wheezes or crackles  Cardiovascular system: S1 & S2 heard, RRR.   Gastrointestinal system: Abdomen is nondistended, soft and nontender. Central nervous system: Alert and oriented Extremities: No edema, no clubbing ,no cyanosis Skin: No rashes, no ulcers,no icterus     Data Reviewed: I have personally reviewed following labs and imaging studies  CBC: Recent Labs  Lab 08/26/22 0252  WBC 4.9  HGB 9.0*  HCT 26.9*  MCV 80.1  PLT 274   Basic Metabolic Panel: Recent Labs  Lab 08/24/22 0218 08/25/22 0706 08/26/22 0252  NA 135  --  132*  K 4.9  --  4.3  CL 102  --  100  CO2 24  --  26  GLUCOSE 89  --  99  BUN 8  --  12  CREATININE 0.73 0.77 0.76  CALCIUM 8.9  --  8.8*  MG 1.8  --   --      No results found for this or any previous visit (from the past 240 hour(s)).    Radiology Studies: No results found.  Scheduled Meds:  amLODipine  5 mg Oral Daily   Chlorhexidine Gluconate Cloth  6 each Topical Daily   enoxaparin (LOVENOX) injection  40 mg Subcutaneous Q24H   feeding supplement  237 mL Oral 5 X Daily   finasteride  5 mg Oral Daily   folic acid  1 mg Oral Daily   guaiFENesin  15 mL Oral BID   levETIRAcetam  1,000 mg Oral BID   magnesium oxide  200 mg Oral Daily   mupirocin ointment   Nasal BID   pantoprazole  40 mg Oral Q12H   senna  1 tablet Oral BID   sodium chloride flush  3 mL Intravenous Q12H   sodium chloride  1 g Oral TID   tamsulosin  0.4 mg Oral QPC supper   thiamine  100 mg Oral Daily   traZODone  50 mg Oral QHS   Continuous Infusions:  sodium chloride 250 mL (08/13/22 0545)     LOS: 19 days   Burnadette Pop, MD [facility] P12/31/2023, 11:54 AM

## 2022-08-30 NOTE — Progress Notes (Signed)
Per [employer] portal this AM, auth remains pending for SNF. SW will provide updates as available.   Dellie Burns, MSW, LCSW [phone] (coverage)

## 2022-08-31 DIAGNOSIS — R112 Nausea with vomiting, unspecified: Secondary | ICD-10-CM | POA: Diagnosis not present

## 2022-08-31 LAB — BASIC METABOLIC PANEL
Anion gap: 14 (ref 5–15)
BUN: 13 mg/dL (ref 8–23)
CO2: 21 mmol/L — ABNORMAL LOW (ref 22–32)
Calcium: 8.9 mg/dL (ref 8.9–10.3)
Chloride: 87 mmol/L — ABNORMAL LOW (ref 98–111)
Creatinine, Ser: 0.68 mg/dL (ref 0.61–1.24)
GFR, Estimated: >60 mL/min (ref >60)
Glucose, Bld: 97 mg/dL (ref 70–99)
Potassium: 3.9 mmol/L (ref 3.5–5.1)
Sodium: 122 mmol/L — ABNORMAL LOW (ref 135–145)

## 2022-08-31 LAB — SODIUM: Sodium: 125 mmol/L — ABNORMAL LOW (ref 135–145)

## 2022-08-31 NOTE — Progress Notes (Signed)
Mobility Specialist: Progress Note   08/31/22 1203  Mobility  Activity Ambulated with assistance in hallway  Level of Assistance Contact guard assist, steadying assist  Assistive Device Front wheel walker  Distance Ambulated (ft) 500 ft  Activity Response Tolerated well  Mobility Referral Yes  $Mobility charge 1 Mobility   Pre-Mobility: 79 HR, 97% SpO2 Post-Mobility: 80 HR, 98% SpO2  Received pt in bed having no complaints and agreeable to mobility. Pt was asymptomatic throughout ambulation and returned to room w/o fault. Left sitting EOB w/ call bell in reach and all needs met.  [place] Evan Jacobs Mobility Specialist Please contact via SecureChat or Rehab office at [phone]

## 2022-08-31 NOTE — Progress Notes (Signed)
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  CT abdomen/pelvis showed moderate amount of retained colonic stool, no bowel obstruction.  Underwent GI evaluation, endoscopy showed 2 cm hiatal hernia, benign-appearing severe cervical stenosis status post dilation.  GI recommending full liquid diet, PPI twice daily and repeat endoscopy for dilation as an outpatient.  PT/OT recommending SNF.  Medically stable for discharge.  Discharge summary and orders are in   Assessment & Plan:  Principal Problem:   Vomiting Active Problems:   Seizures ([place])   Acute tracheostomy management ([place])   Hypokalemia   Acute on chronic urinary retention   Esophageal stricture   Dysphagia   Constipation   Tracheostomy care ([place])   Primary hypertension      -Recurrent  nausea and vomiting: in setting esophageal stricture:  -CT abdomen pelvis: No CT evidence of acute. Moderate amount of retained colonic stool suggesting constipation. No evidence of bowel obstruction. -PRN zofran ordered. Change Reglan to PRN. -IV Protonix BID. He will need high dose PPI for several weeks, then repeat endoscopy for dilation.  -Endoscopy: 2 cm hiatal hernia, benign-appearing severe esophageal stenosis.  Dilated to 9 mm with appropriate mucosal wrents.  No further dilation due to finding an ulceration at the site. -GI is recommending full liquid diet, patient should not to be on solid fluid until the stricture can be dilated further.  -He will need high dose PPI BID for several weeks  then repeat endoscopy for dilation.  -Continue with full liquid diet.    Hypotension: Related to hypovolemia.  Antihypertensives resumed BP Improved with fluids    Acute tracheostomy management: Continue with trach care.  RT following. Nebulizer PRN. Continue Flutter valve   Seizure disorder: History of seizure following CVA. Continue with Keppra.    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  Outpatient follow-up with PCP/urology.   History of chronic hyponatremia/SIADH On salt tablets.   Sodium dropped to the range of 122.  He is asymptomatic.  Not sure whether this is a real value but will check sodium level again .  Continue salt tablets   Constipation: Continue bowel regimen   Hypomagnesemia: Continue supplementation    DVT prophylaxis:enoxaparin (LOVENOX) injection 40 mg Start: 08/14/22 2200     Code Status: Full Code  Family Communication: None at the bed side  Patient status: Inpatient  Patient is from : Home  Anticipated discharge to: SNF  Estimated DC date: whenever possile   Consultants: gI  Procedures:EGD  Antimicrobials:  Anti-infectives (From admission, onward)    None       Subjective:  Patient seen and examined at bedside today. Discharge lying in bed.  Denies any new complaints.  Comfortable  Objective: Vitals:   08/31/22 0030 08/31/22 0405 08/31/22 0732 08/31/22 0829  BP:  131/71 (!) 142/69   Pulse: 91 76 70 72  Resp: 19 18 16 18   Temp:  97.6 F (36.4 C) 99 F (37.2 C)   TempSrc:  Oral Oral   SpO2: 98% 100% 100%  98%  Weight:      Height:        Intake/Output Summary (Last 24 hours) at 08/31/2022 1108 Last data filed at 08/31/2022 0731 Gross per 24 hour  Intake 360 ml  Output 3050 ml  Net -2690 ml   Filed Weights   08/11/22 2133 08/12/22 1157  Weight: 70.7 kg 57.6 kg    Examination:  General exam: Overall  comfortable, not in distress HEENT: PERRL Respiratory system:  no wheezes or crackles trach Cardiovascular system: S1 & S2 heard, RRR.  Gastrointestinal system: Abdomen is nondistended, soft and nontender. Central nervous system: Alert and oriented Extremities: No edema, no clubbing ,no cyanosis Skin: No rashes, no ulcers,no icterus      Data Reviewed: I have personally reviewed following labs and imaging studies  CBC: Recent Labs  Lab 08/26/22 0252  WBC 4.9  HGB 9.0*  HCT 26.9*  MCV 80.1  PLT 191   Basic Metabolic Panel: Recent Labs  Lab 08/25/22 0706 08/26/22 0252 08/31/22 0247  NA  --  132* 122*  K  --  4.3 3.9  CL  --  100 87*  CO2  --  26 21*  GLUCOSE  --  99 97  BUN  --  12 13  CREATININE 0.77 0.76 0.68  CALCIUM  --  8.8* 8.9     No results found for this or any previous visit (from the past 240 hour(s)).    Radiology Studies: No results found.  Scheduled Meds:  amLODipine  5 mg Oral Daily   Chlorhexidine Gluconate Cloth  6 each Topical Daily   enoxaparin (LOVENOX) injection  40 mg Subcutaneous Q24H   feeding supplement  237 mL Oral 5 X Daily   finasteride  5 mg Oral Daily   folic acid  1 mg Oral Daily   guaiFENesin  15 mL Oral BID   levETIRAcetam  1,000 mg Oral BID   magnesium oxide  200 mg Oral Daily   mupirocin ointment   Nasal BID   pantoprazole  40 mg Oral Q12H   senna  1 tablet Oral BID   sodium chloride flush  3 mL Intravenous Q12H   sodium chloride  1 g Oral TID   tamsulosin  0.4 mg Oral QPC supper   thiamine  100 mg Oral Daily   traZODone  50 mg Oral QHS   Continuous Infusions:  sodium chloride 250 mL (08/13/22 0545)     LOS: 20 days   Shelly Coss, MD [facility] P1/08/2022, 11:08 AM

## 2022-08-31 NOTE — Progress Notes (Signed)
Patient has had 2 emesis episodes tonight. Patient wanted something for N/V. As per on call Dr needed to obtain IV access for administration of Zofran. Patient refused IV and medication, on call Dr notified.

## 2022-08-31 NOTE — TOC Progression Note (Addendum)
Transition of Care [facility]) - Progression Note    Patient Details  Name: Evan Jacobs MRN: [id number] Date of Birth: [birth year]  Transition of Care [name] CM/SW [place], RN Phone Number: 08/31/2022, 2:16 PM  Clinical Narrative:    CM checked [place] and insurance authorization is still pending at this time.  08/31/2021 1518 - [place] called back and a peer to peer was requested with the Medical director with the insurance provider.  I called and asked Dr. Tawanna Solo to complete a peer to peer by [place] at [phone] and select option 5.  Peer to peer will need to be completed by 12 noon on 09/01/2021.  Attending MD is aware.  Dr. Tawanna Solo was provided with insurance subscriber # [id number].  08/31/2021 1611 - I called and spoke with Hilliard Clark, Admissions director with [facility] and updated him that the patient's [employer] [facility] [employer] Medicare requested a peer-to-peer.  The attending physician was unable to reach the provider by phone.  Hilliard Clark states that if [facility] Medicare declined the patient under his Medicare benefits that he will meet with the patient and find an alternative facility to accept him under his Medicaid and disability check as a back up plan.  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  Expected Discharge Plan: Long Term Nursing Home Barriers to Discharge: No SNF bed  Expected Discharge Plan and Services   Discharge Planning Services: CM Consult Post Acute Care Choice: Nursing Home Living arrangements for the past 2 months: Apartment (Patient was living with his girlfriend, [name] for past 3 years) Expected Discharge Date: 08/18/22                                     Social Determinants of Health (SDOH) Interventions [place]: No Food  Insecurity (08/12/2022)  Housing: Low Risk  (08/12/2022)  Transportation Needs: Unmet Transportation Needs (08/12/2022)  Utilities: Not At Risk (08/12/2022)  Tobacco Use: Low Risk  (08/17/2022)    Readmission Risk Interventions    08/18/2022    2:36 PM  Readmission Risk Prevention Plan  Transportation Screening Complete  PCP or Specialist Appt within 5-7 Days Complete  Home Care Screening Complete  Medication Review (RN CM) Complete

## 2022-09-01 DIAGNOSIS — R112 Nausea with vomiting, unspecified: Secondary | ICD-10-CM | POA: Diagnosis not present

## 2022-09-01 LAB — BASIC METABOLIC PANEL
Anion gap: 11 (ref 5–15)
BUN: 11 mg/dL (ref 8–23)
CO2: 23 mmol/L (ref 22–32)
Calcium: 8.8 mg/dL — ABNORMAL LOW (ref 8.9–10.3)
Chloride: 96 mmol/L — ABNORMAL LOW (ref 98–111)
Creatinine, Ser: 0.81 mg/dL (ref 0.61–1.24)
GFR, Estimated: >60 mL/min (ref >60)
Glucose, Bld: 125 mg/dL — ABNORMAL HIGH (ref 70–99)
Potassium: 4.2 mmol/L (ref 3.5–5.1)
Sodium: 130 mmol/L — ABNORMAL LOW (ref 135–145)

## 2022-09-01 NOTE — TOC Progression Note (Signed)
Transition of Care [facility]) - Progression Note    Patient Details  Name: Evan Jacobs MRN: [id number] Date of Birth: 08-08-1951  Transition of Care [facility]) CM/SW [place], RN Phone Number: 09/01/2022, 1:46 PM  Clinical Narrative:    CM spoke with the patient at the bedside and he requests CM represent him to place a fast appeal to request SNF placement since [place] denied SNF placement at the facility.  [name] at her home and she is no longer able to care for him at the home and continue his trach care.  [name] and SNF placement at a facility with a respiratory therapy department would allow him to receive extensive trach teaching to provide for his trach care at his own apartment.  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  The patient has an apartment that he would like to return to with family assistance.  SNF admission would support a safe discharge plan since the patient is ambulatory and capable of learning trach care.   LTC placement is not a reasonable solution for this patient.  SNF placement would provide for appropriate teaching for care of his airway and avoid readmission to the hospital.  This note will be sent to [facility] to help support the appeal for SNF placement.   Expected Discharge Plan: Long Term Nursing Home Barriers to Discharge: No SNF bed  Expected Discharge Plan and Services   Discharge Planning Services: CM Consult Post Acute Care Choice: Nursing Home Living arrangements for the past 2 months: Apartment (Patient was living with his girlfriend, [name] for past 3 years) Expected Discharge Date:  09/01/22                                     Social Determinants of Health (SDOH) Interventions SDOH Screenings   Food Insecurity: No Food Insecurity (08/12/2022)  Housing: Low Risk  (08/12/2022)  Transportation Needs: Unmet Transportation Needs (08/12/2022)  Utilities: Not At Risk (08/12/2022)  Tobacco Use: Low Risk  (08/17/2022)    Readmission Risk Interventions    08/18/2022    2:36 PM  Readmission Risk Prevention Plan  Transportation Screening Complete  PCP or Specialist Appt within 5-7 Days Complete  Home Care Screening Complete  Medication Review (RN CM) Complete

## 2022-09-01 NOTE — Progress Notes (Signed)
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  CT abdomen/pelvis showed moderate amount of retained colonic stool, no bowel obstruction.  Underwent GI evaluation, endoscopy showed 2 cm hiatal hernia, benign-appearing severe cervical stenosis status post dilation.  GI recommending full liquid diet, PPI twice daily and repeat endoscopy for dilation as an outpatient.  PT/OT recommending SNF.  Medically stable for discharge.  Discharge summary and orders are in.Peer to peer done today   Assessment & Plan:  Principal Problem:   Vomiting Active Problems:   Seizures ([place])   Acute tracheostomy management ([place])   Hypokalemia   Acute on chronic urinary retention   Esophageal stricture   Dysphagia   Constipation   Tracheostomy care ([place])   Primary hypertension      -Recurrent  nausea and vomiting: in setting esophageal stricture:  -CT abdomen pelvis: No CT evidence of acute. Moderate amount of retained colonic stool suggesting constipation. No evidence of bowel obstruction.  -Endoscopy: 2 cm hiatal hernia, benign-appearing severe esophageal stenosis.  Dilated to 9 mm with appropriate mucosal wrents.  No further dilation due to finding an ulceration at the site. -GI is recommending full liquid diet, patient should not to be on solid fluid until the stricture can be dilated further.  -He will need high dose PPI BID for several weeks then repeat endoscopy for dilation.  -Continue with full liquid diet.    Hypotension: Related to hypovolemia.   Antihypertensives resumed BP Improved with fluids ,fluid stopped   Acute tracheostomy management: Continue with trach care.  RT following. Nebulizer PRN. Continue Flutter valve   Seizure disorder: History of seizure following CVA. Continue with Keppra.    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  Outpatient follow-up with PCP/urology.   History of chronic hyponatremia/SIADH On salt tablets.   Sodium dropped to the range of 122 now improved to 130.Continue salt tablets   Constipation: Continue bowel regimen   Debility/deconditioning: Patient was living with his girlfriend.  Girlfriend states she is not able to take care of his trach.  His family recently kicked him out from home.  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  Peer to peer done today with his insurance    DVT prophylaxis:enoxaparin (LOVENOX) injection 40 mg Start: 08/14/22 2200     Code Status: Full Code  Family Communication: None at the bed side  Patient status: Inpatient  Patient is from : Home  Anticipated discharge to: SNF  Estimated DC date: whenever possible, waiting insurance authorization   Consultants: gI  Procedures:EGD  Antimicrobials:  Anti-infectives (From admission, onward)    None       Subjective: Patient seen and examined at bedside today.  Hemodynamically stable, comfortable.  Lying in bed.  Denies any new complaints today.  On room air.  Denies any shortness of breath or cough.  Objective: Vitals:  09/01/22 0320 09/01/22 0438 09/01/22 0733 09/01/22 0920  BP:  (!) 125/58 (!) 141/75   Pulse: 73 76 91 87  Resp: 16 17 18 16   Temp:  98 F (36.7 C) 98.3 F (36.8 C)   TempSrc:   Oral   SpO2: 100% 97% 100% 97%  Weight:      Height:        Intake/Output  Summary (Last 24 hours) at 09/01/2022 1035 Last data filed at 08/31/2022 2346 Gross per 24 hour  Intake 980 ml  Output 3375 ml  Net -2395 ml   Filed Weights   08/11/22 2133 08/12/22 1157  Weight: 70.7 kg 57.6 kg    Examination:  General exam: Overall comfortable, not in distress HEENT: PERRL,trach Respiratory system:  no wheezes or crackles  Cardiovascular system: S1 & S2 heard, RRR.  Gastrointestinal system: Abdomen is nondistended, soft and nontender. 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    Radiology Studies: No results found.  Scheduled Meds:  amLODipine  5 mg Oral Daily   Chlorhexidine Gluconate Cloth  6 each Topical Daily   enoxaparin (LOVENOX) injection  40 mg Subcutaneous Q24H   feeding supplement  237 mL Oral 5 X Daily   finasteride  5 mg Oral Daily   folic acid  1 mg Oral Daily   guaiFENesin  15 mL Oral BID   levETIRAcetam  1,000 mg Oral BID   magnesium oxide  200 mg Oral Daily   mupirocin ointment   Nasal BID   pantoprazole  40 mg Oral Q12H   senna  1 tablet Oral BID   sodium chloride flush  3 mL Intravenous Q12H   sodium chloride  1 g Oral TID   tamsulosin  0.4 mg Oral QPC supper   thiamine  100 mg Oral Daily   traZODone  50 mg Oral QHS   Continuous Infusions:  sodium chloride  250 mL (08/13/22 0545)     LOS: 21 days   Shelly Coss, MD [facility] P1/10/2022, 10:35 AM

## 2022-09-01 NOTE — TOC Progression Note (Addendum)
Transition of Care [facility]) - Progression Note    Patient Details  Name: Evan Jacobs MRN: [id number] Date of Birth: 1950/09/21  Transition of Care [facility]) CM/SW [place], RN Phone Number: 09/01/2022, 11:31 AM  Clinical Narrative:    CM spoke with the attending physician, Dr. Tawanna Solo and a peer-to-peer was completed this morning with [facility].  I updated the facility admissions CM, Hilliard Clark and he is aware that insurance authorization is still pending.  If insurance is approved today - then the facility is able to accept him for admission.  The patient is aware.  CM will continue to follow the patient to discharge to the facility once insurance is approved.  09/01/2021 1130 - CM spoke with [facility] and the patient was denied for SNF placement - I spoke with the patient and girlfriend by phone and they are requesting an appeal- I called [employer] at [phone] to place a fast appeal through the provider line.  Determination will be made in the next 72 hours.   Expected Discharge Plan: Long Term Nursing Home Barriers to Discharge: No SNF bed  Expected Discharge Plan and Services   Discharge Planning Services: CM Consult Post Acute Care Choice: Nursing Home Living arrangements for the past 2 months: Apartment (Patient was living with his girlfriend, [name] for past 3 years) Expected Discharge Date: 09/01/22                                     Social Determinants of Health (SDOH) Interventions SDOH Screenings   Food Insecurity: No Food Insecurity (08/12/2022)  Housing: Low Risk  (08/12/2022)  Transportation Needs: Unmet Transportation Needs (08/12/2022)  Utilities: Not At Risk (08/12/2022)  Tobacco Use: Low Risk  (08/17/2022)    Readmission Risk Interventions    08/18/2022    2:36 PM  Readmission Risk Prevention Plan  Transportation Screening Complete  PCP or Specialist Appt within 5-7 Days Complete  Home Care Screening Complete  Medication  Review (RN CM) Complete

## 2022-09-02 DIAGNOSIS — R112 Nausea with vomiting, unspecified: Secondary | ICD-10-CM | POA: Diagnosis not present

## 2022-09-02 LAB — BASIC METABOLIC PANEL
Anion gap: 10 (ref 5–15)
BUN: 13 mg/dL (ref 8–23)
CO2: 21 mmol/L — ABNORMAL LOW (ref 22–32)
Calcium: 9 mg/dL (ref 8.9–10.3)
Chloride: 103 mmol/L (ref 98–111)
Creatinine, Ser: 0.77 mg/dL (ref 0.61–1.24)
GFR, Estimated: >60 mL/min (ref >60)
Glucose, Bld: 104 mg/dL — ABNORMAL HIGH (ref 70–99)
Potassium: 5.3 mmol/L — ABNORMAL HIGH (ref 3.5–5.1)
Sodium: 134 mmol/L — ABNORMAL LOW (ref 135–145)

## 2022-09-02 LAB — POTASSIUM: Potassium: 4.2 mmol/L (ref 3.5–5.1)

## 2022-09-02 NOTE — TOC Progression Note (Signed)
Transition of Care [facility]) - Progression Note    Patient Details  Name: Lucah Petta MRN: [id number] Date of Birth: 1951/02/03  Transition of Care [facility]) CM/SW [place], RN Phone Number: 09/02/2022, 8:52 AM  Clinical Narrative:    CM called and spoke with Latanya Maudlin, RNCM with [facility] who is currently handling the fast appeal process for requested SNF placement.  Additional clinicals (recent RT notes and MD progress notes) were requested and sent to [facility] this morning at fax # [phone] for hopeful approval for SNF placement.   Expected Discharge Plan: Long Term Nursing Home Barriers to Discharge: No SNF bed  Expected Discharge Plan and Services   Discharge Planning Services: CM Consult Post Acute Care Choice: Nursing Home Living arrangements for the past 2 months: Apartment (Patient was living with his girlfriend, [name] for past 3 years) Expected Discharge Date: 09/01/22                                     Social Determinants of Health (SDOH) Interventions SDOH Screenings   Food Insecurity: No Food Insecurity (08/12/2022)  Housing: Low Risk  (08/12/2022)  Transportation Needs: Unmet Transportation Needs (08/12/2022)  Utilities: Not At Risk (08/12/2022)  Tobacco Use: Low Risk  (08/17/2022)    Readmission Risk Interventions    08/18/2022    2:36 PM  Readmission Risk Prevention Plan  Transportation Screening Complete  PCP or Specialist Appt within 5-7 Days Complete  Home Care Screening Complete  Medication Review (RN CM) Complete

## 2022-09-02 NOTE — Progress Notes (Signed)
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  CT abdomen/pelvis showed moderate amount of retained colonic stool, no bowel obstruction.  Underwent GI evaluation, endoscopy showed 2 cm hiatal hernia, benign-appearing severe cervical stenosis status post dilation.  GI recommending full liquid diet, PPI twice daily and repeat endoscopy for dilation as an outpatient.  PT/OT recommending SNF.  Medically stable for discharge.  Discharge summary and orders are in.Waiting for insurance authorization.   Assessment & Plan:  Principal Problem:   Vomiting Active Problems:   Seizures ([facility])   Acute tracheostomy management ([facility])   Hypokalemia   Acute on chronic urinary retention   Esophageal stricture   Dysphagia   Constipation   Tracheostomy care ([place])   Primary hypertension      -Recurrent  nausea and vomiting: in setting esophageal stricture:  -CT abdomen pelvis: No CT evidence of acute. Moderate amount of retained colonic stool suggesting constipation. No evidence of bowel obstruction.  -Endoscopy: 2 cm hiatal hernia, benign-appearing severe esophageal stenosis.  Dilated to 9 mm with appropriate mucosal wrents.  No further dilation due to finding an ulceration at the site. -GI is recommending full liquid diet, patient should not to be on solid fluid until the stricture can be dilated further.  -He will need high dose PPI BID for several weeks then repeat endoscopy for dilation.  -Continue with full liquid diet.    Hypotension: Related to  hypovolemia.  Antihypertensives resumed BP Improved with fluids ,fluid stopped   Acute tracheostomy management: Continue with trach care.  RT following. Nebulizer PRN. Continue Flutter valve   Seizure disorder: History of seizure following CVA. Continue with Keppra.    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  Outpatient follow-up with PCP/urology.   History of chronic hyponatremia/SIADH On salt tablets.   Continue   Constipation: Continue bowel regimen   Debility/deconditioning: Patient was living with his girlfriend.  Girlfriend states she is not able to take care of his trach.  His family recently kicked him out from home.  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  Peer to peer done  with his insurance on 1/2.  Waiting for off.  If incidence continues to decline,TOC/RT  planning to encourage/educate his girlfriend for the trach care and DC home.    DVT prophylaxis:enoxaparin (LOVENOX) injection 40 mg Start: 08/14/22 2200     Code Status: Full Code  Family Communication: None at the bed side  Patient status: Inpatient  Patient is from : Home  Anticipated discharge to: SNF vs Home  Estimated DC date: whenever possible, waiting insurance authorization   Consultants: gI  Procedures:EGD  Antimicrobials:  Anti-infectives (From admission, onward)    None       Subjective: Patient seen and examined at bedside today.  Hemodynamically stable without any complaints.  On room air.  Objective: Vitals:   09/02/22 0409  09/02/22 0524 09/02/22 0739 09/02/22 0906  BP:  130/66 108/72   Pulse: 71 74 81 81  Resp: 16 17 15 16   Temp:  98 F (36.7 C) 98.2 F (36.8 C)   TempSrc:   Oral   SpO2: 99% 100% 100% 100%  Weight:      Height:         Intake/Output Summary (Last 24 hours) at 09/02/2022 0954 Last data filed at 09/01/2022 2202 Gross per 24 hour  Intake 240 ml  Output 200 ml  Net 40 ml   Filed Weights   08/11/22 2133 08/12/22 1157  Weight: 70.7 kg 57.6 kg    Examination:  General exam: Overall comfortable, not in distress HEENT: PERRL,trach Respiratory system:  no wheezes or crackles  Cardiovascular system: S1 & S2 heard, RRR.  Gastrointestinal system: Abdomen is nondistended, soft and nontender. 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  Basic Metabolic Panel: Recent Labs  Lab 08/31/22 0247 08/31/22 1144 09/01/22 0503 09/02/22 0249 09/02/22 0720  NA 122* 125* 130* 134*  --   K 3.9  --  4.2 5.3* 4.2  CL 87*  --  96* 103  --   CO2 21*  --  23 21*  --   GLUCOSE 97  --  125* 104*  --   BUN 13  --  11 13  --   CREATININE 0.68  --  0.81 0.77  --   CALCIUM 8.9  --  8.8* 9.0  --      No results found for this or any previous visit (from the past 240 hour(s)).    Radiology Studies: No results found.  Scheduled Meds:  amLODipine  5 mg Oral Daily   Chlorhexidine Gluconate Cloth  6 each Topical Daily   enoxaparin (LOVENOX) injection  40 mg Subcutaneous Q24H   feeding supplement  237 mL Oral 5 X Daily   finasteride  5 mg Oral Daily   folic acid  1 mg Oral Daily   guaiFENesin  15 mL Oral BID   levETIRAcetam  1,000 mg Oral BID   magnesium oxide  200 mg Oral Daily   mupirocin ointment   Nasal BID   pantoprazole  40 mg Oral Q12H   senna  1 tablet Oral BID   sodium chloride flush  3 mL Intravenous Q12H   sodium chloride  1 g Oral TID   tamsulosin  0.4 mg Oral QPC supper   thiamine  100 mg  Oral Daily   traZODone  50 mg Oral QHS   Continuous Infusions:  sodium chloride 250 mL (08/13/22 0545)     LOS: 22 days   Shelly Coss, MD [facility] P1/10/2022, 9:54 AM

## 2022-09-02 NOTE — TOC Progression Note (Addendum)
Transition of Care [facility]) - Progression Note    Patient Details  Name: Evan Jacobs MRN: [id number] Date of Birth: [birth year]  Transition of Care [facility]) CM/SW [place], RN Phone Number: 09/02/2022, 3:06 PM  Clinical Narrative:    CM called and spoke with [employer] representative and the patient was approved for sNF admission to [place] # [id number] from start date of 09/02/2021 to 09/07/2021 - Clinicals can be updated through fax # [phone].  I called Sean, Admissions director with [facility] and was unable to reach him by phone but left a message to check on availability for admission bed today versus tomorrow.  Patient will need PTAR transport set up once bed has been confirmed.  I updated the attending MD on likely transfer to the facility today or tomorrow once admissions director returns my call.  09/02/22- 1550 - CM called and spoke with Hilliard Clark, admission director with [facility] and the patient can transfer to the facility tomorrow.  I called and left a voicemail message with [name], CM at [facility] at [phone].   Expected Discharge Plan: Long Term Nursing Home Barriers to Discharge: No SNF bed  Expected Discharge Plan and Services   Discharge Planning Services: CM Consult Post Acute Care Choice: Nursing Home Living arrangements for the past 2 months: Apartment (Patient was living with his girlfriend, [name] for past 3 years) Expected Discharge Date: 09/01/22                                     Social Determinants of Health (SDOH) Interventions SDOH Screenings   Food Insecurity: No Food Insecurity (08/12/2022)  Housing: Low Risk  (08/12/2022)  Transportation Needs: Unmet Transportation Needs (08/12/2022)  Utilities: Not At Risk (08/12/2022)  Tobacco Use: Low Risk  (08/17/2022)    Readmission Risk Interventions    08/18/2022    2:36 PM  Readmission Risk Prevention Plan  Transportation Screening  Complete  PCP or Specialist Appt within 5-7 Days Complete  Home Care Screening Complete  Medication Review (RN CM) Complete

## 2022-09-03 DIAGNOSIS — R112 Nausea with vomiting, unspecified: Secondary | ICD-10-CM | POA: Diagnosis not present

## 2022-09-03 DIAGNOSIS — Z430 Encounter for attention to tracheostomy: Secondary | ICD-10-CM | POA: Diagnosis not present

## 2022-09-03 DIAGNOSIS — R1319 Other dysphagia: Secondary | ICD-10-CM

## 2022-09-03 DIAGNOSIS — K5900 Constipation, unspecified: Secondary | ICD-10-CM | POA: Diagnosis not present

## 2022-09-03 NOTE — Discharge Summary (Signed)
Physician Discharge Summary   Patient: Evan Jacobs MRN: [id number] DOB: 08-Jun-1951  Admit date:     08/11/2022  Discharge date: 09/03/22  Discharge Physician: Thad Ranger   PCP: Patient, No Pcp Per   Recommendations at discharge:   Needs to follow up with GI in 4-6 weeks--will need further esophageal dilation.  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 Patient had a recent ER visit on 07/26/2022 for the same symptoms. Family also concerned that they are not able to care for him and his tracheostomy needs at home.   Assessment and Plan:  Recurrent  nausea and vomiting: in setting esophageal stricture:  -CT abdomen pelvis: No CT evidence of acute. Moderate amount of retained colonic stool suggesting constipation. No evidence of bowel obstruction. -Patient was placed on antiemetics, IV Protonix twice daily. -Cortisol level 3, ACTH negative. -Underwent EGD which showed 2 cm hiatal hernia, benign-appearing severe esophageal stenosis.  Dilated to 9 mm, no further dilatation due to finding of an ulceration at the site. -Per GI, will need high-dose PPI for several weeks then repeat endoscopy for dilatation. -GI is recommending full liquid diet, patient should not to be on solid fluid until the stricture can be dilated further. He will  stay on full Liquid diet until repeat endoscopy and dilation Discharge on PPI BID.    Hypotension; Related to hypovolemia.  - ACTH test: cortisol base 7,6---16 after 60 minutes.  -Much improved, less likely adrenal insufficiency    Acute tracheostomy management: Continue with trach care.  RT consult. Nebulizer PRN. Flutter valve, guaifenesin ordered   Seizure disorder: History of seizure following CVA. Continue with Keppra.    Acute on chronic urinary retention: He was a started on Proscar and finasteride Pelvic ultrasound showed no significant enlargement of prostate gland.     Hypokalemia, hypomagnesemia - Replaced   History of SIADH and frequent episode of hypokalemia    Estimated body mass index is 20.5 kg/m as calculated from the following:   Height as of this encounter: 5\' 6"  (1.676 m).   Weight as of this encounter: 57.6 kg.      Pain control - Controlled Substance Reporting System database was reviewed. and patient was instructed, not to drive, operate heavy machinery, perform activities at heights, swimming or participation in water activities or provide baby-sitting services while on Pain, Sleep and Anxiety Medications; until their outpatient Physician has advised to do so again. Also recommended to not to take more than prescribed Pain, Sleep and Anxiety Medications.  Consultants: GI  Procedures performed: endoscopy  Disposition: Skilled nursing facility Diet recommendation:  Discharge Diet Orders (From admission, onward)     Start     Ordered   09/02/22 0000  Diet general       Comments: Full liquid diet   09/02/22 1618           FULL LIQUID  DIET    DISCHARGE MEDICATION: Allergies as of 09/03/2022   No Active Allergies      Medication List     STOP taking these medications    lisinopril 5 MG tablet Commonly known as: ZESTRIL   metoprolol tartrate 50 MG tablet Commonly known as: LOPRESSOR       TAKE these medications     amLODipine 5 MG tablet Commonly known as: NORVASC Take 1 tablet (5 mg total) by mouth daily. What changed:  medication strength how much to take   chlorproMAZINE 25 MG tablet Commonly known as: THORAZINE Take 1 tablet (25 mg total) by mouth 3 (three) times daily as needed for vomiting. What changed:  when to take this reasons to take this   feeding supplement Liqd Take 237 mLs by mouth 5 (five) times daily.   finasteride 5 MG tablet Commonly known as: PROSCAR Take 1 tablet (5 mg total) by mouth daily.   folic acid 1 MG tablet Commonly known as: FOLVITE Take 1 tablet (1 mg total) by mouth daily.   guaiFENesin 100 MG/5ML liquid Commonly known as: ROBITUSSIN Take 15 mLs by mouth 2 (two) times daily.   ipratropium-albuterol 0.5-2.5 (3) MG/3ML Soln Commonly known as: DUONEB Take 3 mLs by nebulization every 4 (four) hours as needed.   iron polysaccharides 150 MG capsule Commonly known as: NIFEREX Take 1 capsule (150 mg total) by mouth daily.   levETIRAcetam 1000 MG tablet Commonly known as: KEPPRA Take 1 tablet (1,000 mg total) by mouth 2 (two) times daily.   Magnesium 250 MG Tabs Take 250 mg by mouth daily.   pantoprazole 40 MG tablet Commonly known as: Protonix Take 1 tablet (40 mg total) by mouth 2 (two) times daily. What changed: when to take this   senna 8.6 MG Tabs tablet Commonly known as: SENOKOT Take 1 tablet (8.6 mg total) by mouth 2 (two) times daily.   sodium chloride 1 g tablet Take 1 g by mouth 3 (three) times daily.   tamsulosin 0.4 MG Caps capsule Commonly known as: FLOMAX Take 1 capsule (0.4 mg total) by mouth daily after supper.   thiamine 100 MG tablet Commonly known as: VITAMIN B1 Take 1 tablet (100 mg total) by mouth daily.        Follow-up [facility] Follow up in 1 week(s).   Contact information: [place] [address] [phone]                Discharge Exam: Danley Danker Weights   08/11/22 2133 08/12/22 1157  Weight: 70.7 kg 57.6 kg   S: No acute complaints, tolerating full liquid diet    BP (!) 151/73 (BP Location: Left Arm)   Pulse 84   Temp 97.8 F (36.6 C) (Oral)   Resp 18   Ht 5\' 6"  (1.676 m)   Wt 57.6 kg   SpO2 100%   BMI 20.50 kg/m    Physical Exam General: Alert and oriented x 3, NAD, trach +  Cardiovascular: S1 S2 clear, RRR.  Respiratory: CTAB, no wheezing, rales or rhonchi Gastrointestinal: Soft, nontender, nondistended, NBS Ext: no pedal edema bilaterally Neuro: no new deficits Psych: Normal affect     Condition at discharge: fair  The results of significant diagnostics from this hospitalization (including imaging, microbiology, ancillary and laboratory) are listed below for reference.   Imaging Studies: DG CHEST PORT 1 VIEW  Result Date: 08/13/2022 CLINICAL DATA:  Hypoxemia. EXAM: PORTABLE CHEST 1 VIEW COMPARISON:  August 11, 2022. FINDINGS: Stable cardiomediastinal silhouette. Tracheostomy tube is unchanged. Minimal left basilar subsegmental atelectasis. Right lung is clear. Bony thorax is unremarkable. IMPRESSION: Minimal left basilar subsegmental atelectasis. Electronically Signed   By: Lupita Raider M.D.   On: 08/13/2022 13:43   US PELVIS LIMITED (TRANSABDOMINAL ONLY)  Result Date: 08/11/2022 CLINICAL DATA:  Urinary retention EXAM: ULTRASOUND OF THE MALE PELVIS COMPARISON:  CT 5:20 p.m. FINDINGS: Bladder: The bladder is partially decompressed and is unremarkable; no intraluminal mass or debris is identified. Bilateral ureteral jets are identified. The prevoid volume is 149 cc. The patient was unable to spontaneously void. Prostate gland:  4.3 x 2.1 x 2.2 cm (volume = 10 cm^3). Seminal vesicles:  Unremarkable IMPRESSION: 1. Unremarkable examination of the urinary bladder. Note, the patient was unable to spontaneously void. Electronically Signed   By: Helyn Numbers M.D.   On:  08/11/2022 22:45   CT ABDOMEN PELVIS W CONTRAST  Result Date: 08/11/2022 CLINICAL DATA:  Epigastric pain EXAM: CT ABDOMEN AND PELVIS WITH CONTRAST TECHNIQUE: Multidetector CT imaging of the abdomen and pelvis was performed using the standard protocol following bolus administration of intravenous contrast. RADIATION DOSE REDUCTION: This exam was performed according to the departmental dose-optimization program which includes automated exposure control, adjustment of the mA and/or kV according to patient size and/or use of iterative reconstruction technique. CONTRAST:  4mL OMNIPAQUE IOHEXOL 350 MG/ML SOLN COMPARISON:  CT examination dated April 22, 2021 FINDINGS: Lower chest: Large hiatal hernia.  Bibasilar atelectasis. Hepatobiliary: No focal liver abnormality is seen. No gallstones, gallbladder wall thickening, or biliary dilatation. Pancreas: Unremarkable. No pancreatic ductal dilatation or surrounding inflammatory changes. Spleen: Normal in size without focal abnormality. Adrenals/Urinary Tract: Adrenal glands are unremarkable. Bilateral simple renal cysts. Kidneys are normal, without renal calculi, focal lesion, or hydronephrosis. Bladder is unremarkable. Stomach/Bowel: Stomach is within normal limits. Appendix appears normal. No evidence of bowel wall thickening, distention, or inflammatory changes. Moderate amount of retained colonic stool suggesting constipation. Vascular/Lymphatic: No significant vascular findings are present. No enlarged abdominal or pelvic lymph nodes. Reproductive: Prostate is unremarkable. Other: No abdominal wall hernia or abnormality. No abdominopelvic ascites. Musculoskeletal: No acute or significant osseous findings. IMPRESSION: 1. No CT evidence of acute abdominal/pelvic process. 2. Large hiatal hernia. 3. Moderate amount of retained colonic stool suggesting constipation. No evidence of bowel obstruction. 4. Bilateral simple renal cysts. No evidence of nephrolithiasis or  hydronephrosis. Electronically Signed   By: Larose Hires D.O.   On: 08/11/2022 17:44   DG Chest Portable 1 View  Result Date: 08/11/2022 CLINICAL DATA:  Vomiting, evaluate for aspiration EXAM: PORTABLE CHEST 1 VIEW COMPARISON:  07/26/2022 FINDINGS: Transverse diameter of heart is increased. Thoracic aorta is tortuous. Lung fields are clear of any infiltrates or pulmonary edema. There is no pleural effusion or pneumothorax. Tip of tracheostomy is 3.2 cm above the carina. IMPRESSION: No active disease. Electronically Signed   By: Ernie Avena M.D.   On: 08/11/2022 11:21    Microbiology: Results for orders placed or performed during the hospital encounter of 08/11/22  Resp panel by RT-PCR (RSV, Flu A&B, Covid) Anterior Nasal Swab     Status: None   Collection Time: 08/11/22  3:31 PM   Specimen: Anterior Nasal Swab  Result Value Ref Range Status   SARS Coronavirus 2 by RT PCR NEGATIVE NEGATIVE Final    Comment: (NOTE) SARS-CoV-2 target nucleic acids are NOT DETECTED.  The SARS-CoV-2 RNA is generally  detectable in upper respiratory specimens during the acute phase of infection. The lowest concentration of SARS-CoV-2 viral copies this assay can detect is 138 copies/mL. A negative result does not preclude SARS-Cov-2 infection and should not be used as the sole basis for treatment or other patient management decisions. A negative result may occur with  improper specimen collection/handling, submission of specimen other than nasopharyngeal swab, presence of viral mutation(s) within the areas targeted by this assay, and inadequate number of viral copies(<138 copies/mL). A negative result must be combined with clinical observations, patient history, and epidemiological information. The expected result is Negative.  Fact Sheet for Patients:  BloggerCourse.com  Fact Sheet for Healthcare Providers:  SeriousBroker.it  This test is no t yet  approved or cleared by the [place] FDA and  has been authorized for detection and/or diagnosis of SARS-CoV-2 by FDA under an Emergency Use Authorization (EUA). This EUA will remain  in effect (meaning this test can be used) for the duration of the COVID-19 declaration under Section 564(b)(1) of the Act, 21 U.S.C.section 360bbb-3(b)(1), unless the authorization is terminated  or revoked sooner.       Influenza A by PCR NEGATIVE NEGATIVE Final   Influenza B by PCR NEGATIVE NEGATIVE Final    Comment: (NOTE) The Xpert Xpress SARS-CoV-2/FLU/RSV plus assay is intended as an aid in the diagnosis of influenza from Nasopharyngeal swab specimens and should not be used as a sole basis for treatment. Nasal washings and aspirates are unacceptable for Xpert Xpress SARS-CoV-2/FLU/RSV testing.  Fact Sheet for Patients: BloggerCourse.com  Fact Sheet for Healthcare Providers: SeriousBroker.it  This test is not yet approved or cleared by the [place] FDA and has been authorized for detection and/or diagnosis of SARS-CoV-2 by FDA under an Emergency Use Authorization (EUA). This EUA will remain in effect (meaning this test can be used) for the duration of the COVID-19 declaration under Section 564(b)(1) of the Act, 21 U.S.C. section 360bbb-3(b)(1), unless the authorization is terminated or revoked.     Resp Syncytial Virus by PCR NEGATIVE NEGATIVE Final    Comment: (NOTE) Fact Sheet for Patients: BloggerCourse.com  Fact Sheet for Healthcare Providers: SeriousBroker.it  This test is not yet approved or cleared by the [place] FDA and has been authorized for detection and/or diagnosis of SARS-CoV-2 by FDA under an Emergency Use Authorization (EUA). This EUA will remain in effect (meaning this test can be used) for the duration of the COVID-19 declaration under Section 564(b)(1) of  the Act, 21 U.S.C. section 360bbb-3(b)(1), unless the authorization is terminated or revoked.  Performed at [facility], [address], [place], [place] [address]   Surgical pcr screen     Status: Abnormal   Collection Time: 08/13/22 10:28 PM   Specimen: Nasal Mucosa; Nasal Swab  Result Value Ref Range Status   MRSA, PCR POSITIVE (A) NEGATIVE Final   Staphylococcus aureus POSITIVE (A) NEGATIVE Final    Comment: RESULT CALLED TO, READ BACK BY AND VERIFIED WITH: (NOTE) The Xpert SA Assay (FDA approved for NASAL specimens in patients 40 years of age and older), is one component of a comprehensive surveillance program. It is not intended to diagnose infection nor to guide or monitor treatment. Performed at [facility], [address], [place], [place] [address]     Labs: CBC: No results for input(s): "WBC", "NEUTROABS", "HGB", "HCT", "MCV", "PLT" in the last 168 hours. Basic Metabolic Panel: Recent Labs  Lab 08/31/22 0247 08/31/22 1144 09/01/22 0503 09/02/22  0249 09/02/22 0720  NA 122* 125* 130* 134*  --   K 3.9  --  4.2 5.3* 4.2  CL 87*  --  96* 103  --   CO2 21*  --  23 21*  --   GLUCOSE 97  --  125* 104*  --   BUN 13  --  11 13  --   CREATININE 0.68  --  0.81 0.77  --   CALCIUM 8.9  --  8.8* 9.0  --    Liver Function Tests: No results for input(s): "AST", "ALT", "ALKPHOS", "BILITOT", "PROT", "ALBUMIN" in the last 168 hours. CBG: No results for input(s): "GLUCAP" in the last 168 hours.  Discharge time spent: greater than 30 minutes.  Signed: Estill Cotta, MD [facility] 09/03/2022

## 2022-09-03 NOTE — TOC Transition Note (Addendum)
Transition of Care [facility]) - CM/SW Discharge Note   Patient Details  Name: Evan Jacobs MRN: [id number] Date of Birth: [birth year]  Transition of Care [facility]) CM/SW Contact:  [name], RN Phone Number: 09/03/2022, 9:53 AM   Clinical Narrative:    CM spoke with [name], Admissions director with [facility] SNF and patient has available bed at the facility today.  Message send to attending physician and bedside nursing and discharge summary is updated and sent to the facility via the hub.  I called and updated the patient's family regarding patient's transfer to the facility today.  [name] was given the address and telephone number to [facility].  The patient's family plans to have the patient's cousin, [name], live with him at his apartment when he returns home.  I asked that [name] visit with the facility to learn trach teaching while the patient is admitted to [facility].  Bedside nursing - please call report to [facility] SNF at [phone].  Please send extra trach supplies and ambu bag with the patient for transport.    [facility] transport is coordinated to the facility and transport packet is available at the secretary's desk.   Final next level of care: Skilled Nursing Facility Barriers to Discharge: No SNF bed   Patient Goals and CMS Choice CMS Medicare.gov Compare Post Acute Care list provided to:: Patient Choice offered to / list presented to : Patient  Discharge Placement                         Discharge Plan and Services Additional resources added to the After Visit Summary for     Discharge Planning Services: CM Consult Post Acute Care Choice: Nursing Home                               Social Determinants of Health (SDOH) Interventions SDOH Screenings   Food Insecurity: No Food Insecurity (08/12/2022)  Housing: Low Risk  (08/12/2022)  Transportation Needs: Unmet Transportation Needs (08/12/2022)  Utilities: Not At Risk  (08/12/2022)  Tobacco Use: Low Risk  (08/17/2022)     Readmission Risk Interventions    08/18/2022    2:36 PM  Readmission Risk Prevention Plan  Transportation Screening Complete  PCP or Specialist Appt within 5-7 Days Complete  Home Care Screening Complete  Medication Review (RN CM) Complete

## 2022-09-23 NOTE — Progress Notes (Signed)
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  CT abdomen/pelvis showed moderate amount of retained colonic stool, no bowel obstruction.  Underwent GI evaluation, endoscopy showed 2 cm hiatal hernia, benign-appearing severe cervical stenosis status post dilation.  GI recommending full liquid diet, PPI twice daily and repeat endoscopy for dilation as an outpatient.  PT/OT recommending SNF.  Medically stable for discharge.  Discharge summary and orders done.   Assessment & Plan:  Principal Problem:   Vomiting Active Problems:   Seizures (HCC)   Acute tracheostomy management (HCC)   Hypokalemia   Acute on chronic urinary retention   Esophageal stricture   Dysphagia   Constipation   Tracheostomy care ([place])   Primary hypertension      -Recurrent  nausea and vomiting: in setting esophageal stricture:  -CT abdomen pelvis: No CT evidence of acute. Moderate amount of retained colonic stool suggesting constipation. No evidence of bowel obstruction. -PRN zofran ordered. Change Reglan to PRN. -IV Protonix BID. He will need high dose PPI for several weeks, then repeat endoscopy for dilation.  -Cortisol level. 3--  ACTH negative -Endoscopy: 2 cm hiatal hernia, benign-appearing severe esophageal stenosis.  Dilated to 9 mm with appropriate mucosal wrents.  No further dilation due to finding an ulceration at the site. -GI is recommending full liquid diet, patient should not to be on solid fluid until the stricture can be dilated further. -He will need  high dose PPI BID for several weeks then repeat endoscopy for dilation.  -Continue with full liquid diet.  He will need to be discharge on Full Liquid diet  Discharge on PPI BID.    2-Hypotension; Related to hypovolemia.  Hold BP medications.  Improved with fluids  Resolved.  ACTH test: cortisol base 7,6---16 after 60 minutes.  BP improved. BP medication resume. Less likely Adrenal insufficiency.    Acute tracheostomy management: Continue with trach care.  RT consult. Nebulizer PRN. Flutter valve, guaifenesin ordered   Seizure disorder: History of seizure following CVA. Continue with Keppra.    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  Bladder scan every shift.    Hypokalemia; Replaced   History of SIADH and frequent episode of hypokalemia   Constipation;  on senna.  Had BM   Hypomagnesemia;Replaced.  Continue supplementation.    DVT prophylaxis:     Code Status: Prior  Family Communication: None at the bed side  Patient status: Inpatient  Patient is from : Home  Anticipated discharge to: SNF  Estimated DC date: whenever possile   Consultants: gI  Procedures:EGD  Antimicrobials:  Anti-infectives (From admission, onward)    None       Subjective: Patient seen and examined at bedside today.  Hemodynamically stable lying in bed.  On room air.  Has some secretion on the trach but otherwise does not complain of any shortness of breath or cough.  Objective: Vitals:   09/03/22 0548 09/03/22 0720 09/03/22 0840 09/03/22 1207  BP: (!) 145/76 (!) 151/73    Pulse: 80 84  78  Resp: 18 18  18   Temp: 98.6 F (37 C) 97.8 F (36.6 C)    TempSrc: Oral Oral    SpO2: 99% 100% 98% 98%  Weight:      Height:       No intake or output data in the 24 hours ending 09/23/22 1607  Filed Weights   08/11/22 2133 08/12/22 1157  Weight: 70.7 kg 57.6 kg     Examination:  General exam: Overall comfortable, not in distress, deconditioned, chronically ill looking HEENT: PERRL, trach Respiratory system:  no wheezes or crackles  Cardiovascular system: S1 & S2 heard, RRR.  Gastrointestinal system: Abdomen is nondistended, soft and nontender. Central nervous system: Alert and oriented Extremities: No edema, no clubbing ,no cyanosis Skin: No rashes, no ulcers,no icterus     Data Reviewed: I have personally reviewed following labs and imaging studies  CBC: No results for input(s): "WBC", "NEUTROABS", "HGB", "HCT", "MCV", "PLT" in the last 168 hours.  Basic Metabolic Panel: No results for input(s): "NA", "K", "CL", "CO2", "GLUCOSE", "BUN", "CREATININE", "CALCIUM", "MG", "PHOS" in the last 168 hours.    No results found for this or any previous visit (from the past 240 hour(s)).    Radiology Studies: No results found.  Scheduled Meds:   Continuous Infusions:     LOS: 23 days   Shelly Coss, MD [facility] P1/24/2024, 4:07 PM
